# Patient Record
Sex: Female | Born: 1935 | Race: Black or African American | Hispanic: No | Smoking: Never smoker
Health system: Southern US, Community
[De-identification: ages and names within clinical notes are randomized; demographics above are authoritative.]

## PROBLEM LIST (undated history)

## (undated) DIAGNOSIS — G5600 Carpal tunnel syndrome, unspecified upper limb: Secondary | ICD-10-CM

## (undated) DIAGNOSIS — I10 Essential (primary) hypertension: Secondary | ICD-10-CM

## (undated) DIAGNOSIS — C50919 Malignant neoplasm of unspecified site of unspecified female breast: Secondary | ICD-10-CM

## (undated) HISTORY — DX: Carpal tunnel syndrome, unspecified upper limb: G56.00

## (undated) HISTORY — DX: Essential (primary) hypertension: I10

## (undated) HISTORY — PX: TONSILLECTOMY: SUR1361

## (undated) HISTORY — PX: OTHER SURGICAL HISTORY: SHX169

## (undated) HISTORY — DX: Malignant neoplasm of unspecified site of unspecified female breast: C50.919

---

## 2007-12-21 ENCOUNTER — Encounter: Payer: Self-pay | Admitting: Cardiology

## 2007-12-22 ENCOUNTER — Encounter: Payer: Self-pay | Admitting: Cardiology

## 2008-01-12 ENCOUNTER — Encounter: Payer: Self-pay | Admitting: Cardiology

## 2009-02-06 ENCOUNTER — Encounter: Payer: Self-pay | Admitting: Cardiology

## 2009-06-25 ENCOUNTER — Encounter: Payer: Self-pay | Admitting: Cardiology

## 2009-11-05 ENCOUNTER — Encounter: Payer: Self-pay | Admitting: Cardiology

## 2009-11-05 ENCOUNTER — Ambulatory Visit: Payer: Self-pay | Admitting: Cardiology

## 2009-11-06 ENCOUNTER — Encounter: Payer: Self-pay | Admitting: Cardiology

## 2009-11-20 ENCOUNTER — Encounter: Payer: Self-pay | Admitting: Cardiology

## 2010-02-19 ENCOUNTER — Encounter: Payer: Self-pay | Admitting: Cardiology

## 2010-03-19 ENCOUNTER — Telehealth: Payer: Self-pay | Admitting: Cardiology

## 2010-03-19 ENCOUNTER — Ambulatory Visit: Payer: Self-pay | Admitting: Cardiology

## 2010-03-19 DIAGNOSIS — Z8673 Personal history of transient ischemic attack (TIA), and cerebral infarction without residual deficits: Secondary | ICD-10-CM | POA: Insufficient documentation

## 2010-03-19 DIAGNOSIS — G459 Transient cerebral ischemic attack, unspecified: Secondary | ICD-10-CM

## 2010-03-19 DIAGNOSIS — I890 Lymphedema, not elsewhere classified: Secondary | ICD-10-CM

## 2010-03-19 DIAGNOSIS — I10 Essential (primary) hypertension: Secondary | ICD-10-CM | POA: Insufficient documentation

## 2010-03-19 DIAGNOSIS — R609 Edema, unspecified: Secondary | ICD-10-CM | POA: Insufficient documentation

## 2010-03-19 DIAGNOSIS — Z9010 Acquired absence of unspecified breast and nipple: Secondary | ICD-10-CM

## 2010-03-19 DIAGNOSIS — R0789 Other chest pain: Secondary | ICD-10-CM

## 2010-03-21 ENCOUNTER — Encounter: Payer: Self-pay | Admitting: Cardiology

## 2010-03-25 ENCOUNTER — Encounter: Payer: Self-pay | Admitting: Cardiology

## 2010-04-23 ENCOUNTER — Encounter: Payer: Self-pay | Admitting: Cardiology

## 2010-05-14 ENCOUNTER — Encounter: Payer: Self-pay | Admitting: Cardiology

## 2010-07-16 ENCOUNTER — Ambulatory Visit: Payer: Self-pay | Admitting: Cardiology

## 2010-07-16 DIAGNOSIS — G5600 Carpal tunnel syndrome, unspecified upper limb: Secondary | ICD-10-CM

## 2010-07-22 ENCOUNTER — Encounter: Payer: Self-pay | Admitting: Cardiology

## 2010-07-23 ENCOUNTER — Encounter: Payer: Self-pay | Admitting: Cardiology

## 2010-07-30 ENCOUNTER — Encounter: Payer: Self-pay | Admitting: Cardiology

## 2010-08-19 ENCOUNTER — Encounter: Payer: Self-pay | Admitting: Cardiology

## 2010-08-23 ENCOUNTER — Encounter (INDEPENDENT_AMBULATORY_CARE_PROVIDER_SITE_OTHER): Payer: Self-pay | Admitting: *Deleted

## 2010-09-10 NOTE — Consult Note (Signed)
Summary: PULMONARY FUNCTION TESTING/ DR. HENDERSON  PULMONARY FUNCTION TESTING/ DR. HENDERSON   Imported By: Zachary George 03/19/2010 10:31:30  _____________________________________________________________________  External Attachment:    Type:   Image     Comment:   External Document

## 2010-09-10 NOTE — Letter (Signed)
Summary: [facility] Occupational Therapy Recertification [facility] Occupational Therapy Recertification Lymphedema Therapy   Imported By: Roderic Ovens 05/10/2010 12:01:41  _____________________________________________________________________  External Attachment:    Type:   Image     Comment:   External Document

## 2010-09-10 NOTE — Letter (Signed)
Summary: [facility] D/C DR. Lia Hopping   [facility] D/C DR. XAJE HASANAJ   Imported By: Zachary George 03/19/2010 10:30:46  _____________________________________________________________________  External Attachment:    Type:   Image     Comment:   External Document

## 2010-09-10 NOTE — Assessment & Plan Note (Signed)
Summary: NP-CONSULT   Visit Type:  Initial Consult Primary Zuzanna Maroney:  Dr. Annette Stable A. Hasanaj  CC:  c/o SOB, hand, and &LEE.  History of Present Illness: The patient presents for evaluation of lower extremity edema.  She has also had increasing left extremity edema. The upper extremity edema has been present since a left mastectomy. However, it seems to be getting worse and now involves her hand. It has become uncomfortable. She has been on hydrochlorothiazide as part of her Diovan for some time and recently takes Lasix p.r.n. as well. While she says she urinates quite frequently she doesn't think this is healthy edema. She has had increasing bilateral lower extremity edema as well. This has been chronic but worsening. She does not keep her feet elevated as much as she could. She does limit salt but doesn't avoid it completely. Her cardiac workup has included an echocardiogram which demonstrated well-preserved ejection fraction and no evidence of pulmonary hypertension. There were no valvular abnormalities. She had a preserved ejection fraction with some evidence of diastolic dysfunction. She denies any severe shortness of breath and does not have PND or orthopnea. She does not have chest pressure, neck or arm discomfort. She denies any palpitations, presyncope or syncope.  Preventive Screening-Counseling & Management  Alcohol-Tobacco     Smoking Status: never  Current Medications (verified): 1)  Diovan 160 Mg Tabs (Valsartan) .... Take 1 Tablet By Mouth Once A Day 2)  Lortab 5-500 Mg Tabs (Hydrocodone-Acetaminophen) .... Take 1 Tablet By Mouth Two Times A Day 3)  Furosemide 20 Mg Tabs (Furosemide) .... Take 1 Tablet By Mouth Once A Day 4)  Multivitamins  Tabs (Multiple Vitamin) .... Take 1 Tablet By Mouth Once A Day 5)  Stool Softener 100 Mg Caps (Docusate Sodium) .... Take 2 Tablet By Mouth Once A Day 6)  Tizanidine Hcl 4 Mg Tabs (Tizanidine Hcl) .... Take 1 Tablet By Mouth Once A Day 7)  Advil  200 Mg Tabs (Ibuprofen) .... As Needed 8)  Artificial Tears  Soln (Artificial Tear Solution) .... As Needed 9)  L-Carnitine 500 Mg Tabs (Levocarnitine) .... Take 1 Tablet By Mouth Once A Day 10)  Caltrate 600+d 600-400 Mg-Unit Tabs (Calcium Carbonate-Vitamin D) .... Take 2 Tablet By Mouth Once A Day  Allergies (verified): 1)  ! Inderal 2)  ! Penicillin  Comments:  Nurse/Medical Assistant: The patient's medication bottles and allergies were reviewed with the patient and were updated in the Medication and Allergy Lists.  Past History:  Past Medical History: Hypertension Breast Cancer Carpal Tunnel  Family History: Father: Died age 40  Mother: Died age 43  Social History: Widowed  Alcohol Use - no Never smoked  Smoking Status:  never  Review of Systems       As stated in the HPI and negative for all other systems.   Vital Signs:  Patient profile:   77 year old female Height:      64 inches Weight:      198 pounds BMI:     34.11 Pulse rate:   76 / minute BP sitting:   155 / 84  (right arm) Cuff size:   large  Vitals Entered By: Carlye Grippe (March 19, 2010 2:00 PM)  Nutrition Counseling: Patient's BMI is greater than 25 and therefore counseled on weight management options. CC: c/o SOB,hand,&LEE   Physical Exam  General:  Well developed, well nourished, in no acute distress. Head:  normocephalic and atraumatic Eyes:  PERRLA/EOM intact; conjunctiva and lids  normal. Mouth:  Teeth, gums and palate normal. Oral mucosa normal. Neck:  Neck supple, no JVD. No masses, thyromegaly or abnormal cervical nodes. Chest Wall:  Status post left mastectomy Lungs:  Clear bilaterally to auscultation and percussion. Abdomen:  Bowel sounds positive; abdomen soft and non-tender without masses, organomegaly, or hernias noted. No hepatosplenomegaly, obese Msk:  Back normal, normal gait. Muscle strength and tone normal. Extremities:  severe left upper extremity swelling,  bilateral nonpitting lower extremity swelling Neurologic:  Alert and oriented x 3. Skin:  Intact without lesions or rashes. Cervical Nodes:  no significant adenopathy Psych:  Normal affect.   Detailed Cardiovascular Exam  Neck    Carotids: Carotids full and equal bilaterally without bruits.      Neck Veins: Normal, no JVD.    Heart    Inspection: no deformities or lifts noted.      Palpation: normal PMI with no thrills palpable.      Auscultation: regular rate and rhythm, S1, S2 without murmurs, rubs, gallops, or clicks.    Vascular    Abdominal Aorta: no palpable masses, pulsations, or audible bruits.      Pedal Pulses: dorsalis pedis bilaterally normal    Radial Pulses: normal radial pulses bilaterally.      Peripheral Circulation: no clubbing, cyanosis, or edema noted with normal capillary refill.     Impression & Recommendations:  Problem # 1:  LYMPHEDEMA, LEFT ARM (ICD-457.1) I believe that her left edema is lymphedema related to the mastectomy. We discussed conservative therapies of keeping her arm elevated. I will refer her for occupational therapy consultation for massage therapy of lymphedema. This is done at the hospital. Orders: Misc. Referral (Misc. Ref)  Problem # 2:  HYPERTENSION (ICD-401.9) I reviewed her blood pressure diary from home and it is usually well controlled. I will suggest no change today.  Problem # 3:  DEPENDENT EDEMA, LEGS, BILATERAL (ICD-782.3) She may have a mixed component for her lower extremity edema. There may be some element of lymphedema as it is firm and not responsive to diuresis. There is some pitting component. We discussed salt and fluid restriction and keeping her feet elevated. She cannot pull on compression stockings. She doesn't want more diuretic as she is urinating frequently. I will check venous Dopplers to look for incompetence. If there is no evidence of this perhaps she would benefit from physical therapy or massage therapy for  lymphedema of her lower extremities as well. Orders: Venous Duplex Lower Extremity (Venous Dup Lower E)  Patient Instructions: 1)  Your physician has requested that you have a lower or upper extremity venous duplex.  This test is an ultrasound of the veins in the legs or arms.  It looks at venous blood flow that carries blood from the heart to the legs or arms.  Allow one hour for a Lower Venous exam.  Allow thirty minutes for an Upper Venous exam. There are no restrictions or special instructions. 2)  You have been referred to [facility] Outpatient Occupational Therapy. 3)  Your physician wants you to follow-up in: 4 months. You will receive a reminder letter in the mail one-two months in advance. If you don't receive a letter, please call our office to schedule the follow-up appointment.

## 2010-09-10 NOTE — Letter (Signed)
Summary: [facility] - Physical Rehabilitation Services  [facility] - Physical Rehabilitation Services   Imported By: Marylou Mccoy 05/20/2010 10:20:44  _____________________________________________________________________  External Attachment:    Type:   Image     Comment:   External Document

## 2010-09-10 NOTE — Letter (Signed)
Summary: External Correspondence/ OFFICE VISIT DR. HASANAJ  External Correspondence/ OFFICE VISIT DR. HASANAJ   Imported By: Dorise Hiss 03/08/2010 15:55:15  _____________________________________________________________________  External Attachment:    Type:   Image     Comment:   External Document

## 2010-09-10 NOTE — Letter (Signed)
Summary: [facility] Physical Rehab Evaluation   [facility] Physical Rehab Evaluation   Imported By: Roderic Ovens 04/03/2010 11:31:54  _____________________________________________________________________  External Attachment:    Type:   Image     Comment:   External Document

## 2010-09-10 NOTE — Letter (Signed)
Summary: External Correspondence/ OFFICE VISIT DR. HASANAJ  External Correspondence/ OFFICE VISIT DR. HASANAJ   Imported By: Dorise Hiss 03/08/2010 15:53:38  _____________________________________________________________________  External Attachment:    Type:   Image     Comment:   External Document

## 2010-09-10 NOTE — Letter (Signed)
Summary: [facility] D/C DR. Lia Hopping  [facility] D/C DR. XAJE HASANAJ   Imported By: Zachary George 03/19/2010 10:33:53  _____________________________________________________________________  External Attachment:    Type:   Image     Comment:   External Document

## 2010-09-10 NOTE — Assessment & Plan Note (Signed)
Summary: 4 MO FU PER DEC REMINDER   Visit Type:  Follow-up Primary Provider:  Dr. Annette Stable A. Hasanaj  CC:  HTN and Edema.  History of Present Illness: The patient presents for followup of swelling and hypertension. At the last visit I sent her to physical therapy for lymphedema treatment of her left arm. It seems to have helped her arm is much smaller. She's not wearing the sleeve or glove today. She does think she feels a little bit better following this therapy. She has other complaints such as bilateral sciatica and tingling and numbness in her right hand. She has continued chronic lower extremity nonpitting edema. She brings a blood pressure diary today and demonstrates its typically in the 150s to 160s systolic with diastolics in the 90s. She is not describing new shortness of breath, PND or orthopnea. She has not discussed his new palpitations, presyncope or syncope.  Preventive Screening-Counseling & Management  Alcohol-Tobacco     Smoking Status: never  Current Medications (verified): 1)  Diovan Hct 160-25 Mg Tabs (Valsartan-Hydrochlorothiazide) .... Take 1 Tablet By Mouth Once A Day 2)  Multivitamins  Tabs (Multiple Vitamin) .... Take 1 Tablet By Mouth Once A Day 3)  Senokot S 8.6-50 Mg Tabs (Sennosides-Docusate Sodium) .... Take 2 Tablet By Mouth Once A Day 4)  Tizanidine Hcl 4 Mg Tabs (Tizanidine Hcl) .... Take 1 Tablet By Mouth Once A Day 5)  Advil 200 Mg Tabs (Ibuprofen) .... As Needed 6)  Artificial Tears  Soln (Artificial Tear Solution) .... As Needed 7)  Caltrate 600+d 600-400 Mg-Unit Tabs (Calcium Carbonate-Vitamin D) .... Take 2 Tablet By Mouth Once A Day 8)  Meloxicam 15 Mg Tabs (Meloxicam) .... Take 1 Tablet By Mouth Once A Day 9)  Gabapentin 300 Mg Caps (Gabapentin) .... Take 1 Tablet By Mouth Three Times A Day 10)  Alprazolam 0.25 Mg Tabs (Alprazolam) .... Take 1 Tablet By Mouth Two Times A Day 11)  Tramadol Hcl 50 Mg Tabs (Tramadol Hcl) .... Take 1 Tablet By Mouth Four  Times A Day  Allergies (verified): 1)  ! Inderal 2)  ! Penicillin  Comments:  Nurse/Medical Assistant: The patient's medication bottles and allergies were reviewed with the patient and were updated in the Medication and Allergy Lists.  Past History:  Past Medical History: Reviewed history from 03/19/2010 and no changes required. Hypertension Breast Cancer Carpal Tunnel  Past Surgical History: Reviewed history from 03/19/2010 and no changes required. Tonsillectomy Left Breast Mastectomy  Review of Systems       As stated in the HPI and negative for all other systems.   Vital Signs:  Patient profile:   77 year old female Height:      64 inches Weight:      185 pounds Pulse rate:   102 / minute BP sitting:   128 / 82  (right arm) Cuff size:   large  Vitals Entered By: Carlye Grippe (July 16, 2010 3:42 PM)  Serial Vital Signs/Assessments:  Time      Position  BP       Pulse  Resp  Temp     By 3:52 PM             144/89   96                    Carlye Grippe  Comments: 3:52 PM right arm/reg cuff By: Carlye Grippe    Physical Exam  General:  Well developed, well nourished, in  no acute distress. Head:  normocephalic and atraumatic Eyes:  PERRLA/EOM intact; conjunctiva and lids normal. Neck:  Neck supple, no JVD. No masses, thyromegaly or abnormal cervical nodes. Chest Wall:  Status post left mastectomy Lungs:  Clear bilaterally to auscultation and percussion. Abdomen:  Bowel sounds positive; abdomen soft and non-tender without masses, organomegaly, or hernias noted. No hepatosplenomegaly, obese Msk:  Back normal, normal gait. Muscle strength and tone normal. Extremities:  less severe left upper extremity swelling, bilateral nonpitting lower extremity swelling Neurologic:  Alert and oriented x 3. Skin:  Intact without lesions or rashes. Cervical Nodes:  no significant adenopathy Axillary Nodes:  no significant adenopathy Psych:  Normal  affect.   Detailed Cardiovascular Exam  Neck    Carotids: Carotids full and equal bilaterally without bruits.      Neck Veins: Normal, no JVD.    Heart    Inspection: no deformities or lifts noted.      Palpation: normal PMI with no thrills palpable.      Auscultation: regular rate and rhythm, S1, S2 without murmurs, rubs, gallops, or clicks.    Vascular    Abdominal Aorta: no palpable masses, pulsations, or audible bruits.      Pedal Pulses: dorsalis pedis bilaterally normal    Radial Pulses: normal radial pulses bilaterally.      Peripheral Circulation: no clubbing, cyanosis, or edema noted with normal capillary refill.     Impression & Recommendations:  Problem # 1:  LYMPHEDEMA, LEFT ARM (ICD-457.1) This is improved. I discussed with her the importance of keeping the arm elevated and wearing sleeves. She understands this will not improve with diuretics and she needs these mechanical treatments.  Problem # 2:  HYPERTENSION (ICD-401.9) Her blood pressure is not controlled. I will increase her Diovan HCT 320/25. She will get a basic metabolic profile in 2 weeks.  Problem # 3:  CARPAL TUNNEL SYNDROME, RIGHT (ICD-354.0) The patient asked if her right hand tingling could be vascular but there is no evidence of this. I will defer to her primary providers.  Other Orders: T-Basic Metabolic Panel [phone])  Patient Instructions: 1)  Your physician wants you to follow-up in: 6 months. You will receive a reminder letter in the mail one-two months in advance. If you don't receive a letter, please call our office to schedule the follow-up appointment. 2)  Increase Diovan HCT to 320/25mg  by mouth once daily. You will need to get new prescription filled for this.  3)  Your physician recommends that you go to the [facility] for lab work: DO LAB WORK IN 2 WEEKS. Prescriptions: DIOVAN HCT 320-25 MG TABS (VALSARTAN-HYDROCHLOROTHIAZIDE) Take 1 tablet by mouth once a day  #30 x 6    Entered by:   Cyril Loosen, RN, BSN   Authorized by:   Rollene Rotunda, MD, [facility]   Signed by:   Cyril Loosen, RN, BSN on 07/16/2010   Method used:   Electronically to        [facility]  [address] [phone]* (retail)       [address]       [place], [place]  [address]       Ph: [phone]       Fax: [phone]   RxID:   [id number]  I have reviewed and approved all prescriptions at the time of this visit. Rollene Rotunda, MD, [facility]  July 16, 2010 4:16 PM  Appended Document: Orders Update    Clinical Lists Changes  Orders: Added new Referral  order of Misc. Referral (Misc. Ref) - Signed

## 2010-09-10 NOTE — Letter (Signed)
Summary: External Correspondence/ OFFICE VISIT DR. HASANAJ  External Correspondence/ OFFICE VISIT DR. HASANAJ   Imported By: Dorise Hiss 03/08/2010 15:56:33  _____________________________________________________________________  External Attachment:    Type:   Image     Comment:   External Document

## 2010-09-10 NOTE — Progress Notes (Signed)
Summary: PHONE:  TAKING ADVIL  Phone Note Call from Patient Call back at Home Phone [phone]   Caller: Patient Reason for Call: Talk to Doctor Summary of Call: Wants to know if she can continue taking Advil 200 nmg. States that she forgot to ask Dr. Antoine Poche on her visit today.  Initial call taken by: Zachary George,  March 19, 2010 3:14 PM  Follow-up for Phone Call        [facility] to take Advil Follow-up by: Rollene Rotunda, MD, [facility],  March 21, 2010 1:57 PM  Additional Follow-up for Phone Call Additional follow up Details #1::        Pt notified and verbalized understanding.  Additional Follow-up by: Cyril Loosen, RN, BSN,  March 21, 2010 3:30 PM

## 2010-09-12 NOTE — Letter (Signed)
Summary: External Correspondence/ [facility]  External Correspondence/ [facility]   Imported By: Dorise Hiss 07/24/2010 10:48:44  _____________________________________________________________________  External Attachment:    Type:   Image     Comment:   External Document

## 2010-09-12 NOTE — Letter (Signed)
Summary: Engineer, materials at [facility]  [address]   [place], [place] [address]   Phone: [phone]  Fax: [phone]        August 23, 2010 MRN: [id number]    [facility] [address] [place], [place]  [address]    Dear Ms. Bagnall,  Your test ordered by Selena Batten has been reviewed by your physician (or physician assistant) and was found to be normal or stable. Your physician (or physician assistant) felt no changes were needed at this time.  ____ Echocardiogram  ____ Cardiac Stress Test  __X__ Lab Work  ____ Peripheral vascular study of arms, legs or neck  ____ CT scan or X-ray  ____ Lung or Breathing test  ____ Other:   Thank you.   Cyril Loosen, RN, BSN    Duane Boston, M.D., F.A.C.C. Thressa Sheller, M.D., F.A.C.C. Oneal Grout, M.D., F.A.C.C. Cheree Ditto, M.D., F.A.C.C. Daiva Nakayama, M.D., F.A.C.C. Kenney Houseman, M.D., F.A.C.C. Jeanne Ivan, PA-C

## 2010-09-12 NOTE — Consult Note (Signed)
Summary: [facility] Evaluation Request   [facility] Evaluation Request   Imported By: Roderic Ovens 08/08/2010 12:12:36  _____________________________________________________________________  External Attachment:    Type:   Image     Comment:   External Document

## 2011-01-20 ENCOUNTER — Encounter: Payer: Self-pay | Admitting: Cardiology

## 2011-01-28 ENCOUNTER — Ambulatory Visit: Payer: Self-pay | Admitting: Cardiology

## 2019-03-11 ENCOUNTER — Ambulatory Visit (INDEPENDENT_AMBULATORY_CARE_PROVIDER_SITE_OTHER)
Admission: EM | Admit: 2019-03-11 | Discharge: 2019-03-11 | Disposition: A | Discharge status: Other Acute Inpatient Hospital | Payer: Medicare Other | Source: Home / Self Care

## 2019-03-11 ENCOUNTER — Emergency Department (HOSPITAL_COMMUNITY): Payer: Medicare Other

## 2019-03-11 ENCOUNTER — Other Ambulatory Visit: Payer: Self-pay

## 2019-03-11 ENCOUNTER — Encounter (HOSPITAL_COMMUNITY): Payer: Self-pay | Admitting: Emergency Medicine

## 2019-03-11 ENCOUNTER — Emergency Department (HOSPITAL_COMMUNITY)
Admission: EM | Admit: 2019-03-11 | Discharge: 2019-03-12 | Disposition: A | Discharge status: Home / Self Care | Payer: Medicare Other | Attending: Emergency Medicine | Admitting: Emergency Medicine

## 2019-03-11 DIAGNOSIS — R41841 Cognitive communication deficit: Secondary | ICD-10-CM | POA: Insufficient documentation

## 2019-03-11 DIAGNOSIS — R410 Disorientation, unspecified: Secondary | ICD-10-CM | POA: Diagnosis not present

## 2019-03-11 DIAGNOSIS — I10 Essential (primary) hypertension: Secondary | ICD-10-CM

## 2019-03-11 DIAGNOSIS — Z853 Personal history of malignant neoplasm of breast: Secondary | ICD-10-CM | POA: Insufficient documentation

## 2019-03-11 DIAGNOSIS — R404 Transient alteration of awareness: Secondary | ICD-10-CM

## 2019-03-11 DIAGNOSIS — R531 Weakness: Secondary | ICD-10-CM | POA: Diagnosis present

## 2019-03-11 DIAGNOSIS — R030 Elevated blood-pressure reading, without diagnosis of hypertension: Secondary | ICD-10-CM

## 2019-03-11 DIAGNOSIS — D329 Benign neoplasm of meninges, unspecified: Secondary | ICD-10-CM | POA: Diagnosis not present

## 2019-03-11 DIAGNOSIS — R4789 Other speech disturbances: Secondary | ICD-10-CM

## 2019-03-11 DIAGNOSIS — Z8673 Personal history of transient ischemic attack (TIA), and cerebral infarction without residual deficits: Secondary | ICD-10-CM

## 2019-03-11 LAB — POCT URINALYSIS DIP (DEVICE)
Bilirubin Urine: NEGATIVE
Glucose, UA: NEGATIVE mg/dL
Hgb urine dipstick: NEGATIVE
Ketones, ur: NEGATIVE mg/dL
Leukocytes,Ua: NEGATIVE
Nitrite: NEGATIVE
Protein, ur: NEGATIVE mg/dL
Specific Gravity, Urine: 1.02 (ref 1.005–1.030)
Urobilinogen, UA: 0.2 mg/dL (ref 0.0–1.0)
pH: 6.5 (ref 5.0–8.0)

## 2019-03-11 LAB — CBG MONITORING, ED: Glucose-Capillary: 87 mg/dL (ref 70–99)

## 2019-03-11 LAB — COMPREHENSIVE METABOLIC PANEL
ALT: 10 U/L (ref 0–44)
AST: 20 U/L (ref 15–41)
Albumin: 3.8 g/dL (ref 3.5–5.0)
Alkaline Phosphatase: 48 U/L (ref 38–126)
Anion gap: 10 (ref 5–15)
BUN: 10 mg/dL (ref 8–23)
CO2: 26 mmol/L (ref 22–32)
Calcium: 9.6 mg/dL (ref 8.9–10.3)
Chloride: 99 mmol/L (ref 98–111)
Creatinine, Ser: 0.65 mg/dL (ref 0.44–1.00)
GFR calc Af Amer: >60 mL/min (ref >60)
GFR calc non Af Amer: >60 mL/min (ref >60)
Glucose, Bld: 100 mg/dL — ABNORMAL HIGH (ref 70–99)
Potassium: 3.8 mmol/L (ref 3.5–5.1)
Sodium: 135 mmol/L (ref 135–145)
Total Bilirubin: 1.1 mg/dL (ref 0.3–1.2)
Total Protein: 7.3 g/dL (ref 6.5–8.1)

## 2019-03-11 LAB — CBC
HCT: 40.6 % (ref 36.0–46.0)
Hemoglobin: 13.2 g/dL (ref 12.0–15.0)
MCH: 27.8 pg (ref 26.0–34.0)
MCHC: 32.5 g/dL (ref 30.0–36.0)
MCV: 85.7 fL (ref 80.0–100.0)
Platelets: 128 10*3/uL — ABNORMAL LOW (ref 150–400)
RBC: 4.74 MIL/uL (ref 3.87–5.11)
RDW: 14 % (ref 11.5–15.5)
WBC: 2.4 10*3/uL — ABNORMAL LOW (ref 4.0–10.5)
nRBC: 0 % (ref 0.0–0.2)

## 2019-03-11 LAB — DIFFERENTIAL
Abs Immature Granulocytes: 0 10*3/uL (ref 0.00–0.07)
Basophils Absolute: 0 10*3/uL (ref 0.0–0.1)
Basophils Relative: 0 %
Eosinophils Absolute: 0.1 10*3/uL (ref 0.0–0.5)
Eosinophils Relative: 3 %
Immature Granulocytes: 0 %
Lymphocytes Relative: 39 %
Lymphs Abs: 1 10*3/uL (ref 0.7–4.0)
Monocytes Absolute: 0.3 10*3/uL (ref 0.1–1.0)
Monocytes Relative: 14 %
Neutro Abs: 1 10*3/uL — ABNORMAL LOW (ref 1.7–7.7)
Neutrophils Relative %: 44 %

## 2019-03-11 LAB — PROTIME-INR
INR: 1.1 (ref 0.8–1.2)
Prothrombin Time: 14.4 seconds (ref 11.4–15.2)

## 2019-03-11 LAB — APTT: aPTT: 33 seconds (ref 24–36)

## 2019-03-11 MED ORDER — SODIUM CHLORIDE 0.9% FLUSH
3.0000 mL | Freq: Once | INTRAVENOUS | Status: DC
Start: 2019-03-11 — End: 2019-03-12

## 2019-03-11 NOTE — ED Notes (Signed)
Patient assisted to restroom, stand by with two assist, able to bear weight on legs but c/o "I don't know why my legs have just stopped working the last few days".

## 2019-03-11 NOTE — ED Provider Notes (Addendum)
MRN: [id number] DOB: 07/20/1925  Subjective:   Cassandra Wu is a 85 y.o. female presenting for 1 day history of acute onset of AMS.  Patient and her daughter report that she became confused and was having difficulty finding words and gathering her thoughts.  She did check her blood pressure last night and was elevated in the 160s where she has been normal and off blood pressure medications for the past year to 2 years.  Her PCP was on board with this as her blood pressure readings were normal.  However they have remained elevated until today.  She also complains of chronic bilateral lower leg edema that is actually improved today.  She is also had urinary frequency at night and is not keen on taking a diuretic as a result.  Denies fever, active confusion, vision changes, facial droop, headache, chest pain, nausea, vomiting, abdominal pain, dysuria, hematuria.  Reports that she is not taking any medications currently.  No current facility-administered medications for this encounter.   Current Outpatient Medications:  .  ALPRAZolam (XANAX) 0.25 MG tablet, Take 0.25 mg by mouth 2 (two) times daily.  , Disp: , Rfl:  .  ARTIFICIAL TEARS ophthalmic solution, 1 drop as needed.  , Disp: , Rfl:  .  Calcium Carbonate-Vitamin D (CALTRATE 600+D) 600-400 MG-UNIT per tablet, Take 1 tablet by mouth daily.  , Disp: , Rfl:  .  gabapentin (NEURONTIN) 300 MG capsule, Take 300 mg by mouth 3 (three) times daily.  , Disp: , Rfl:  .  ibuprofen (ADVIL) 200 MG tablet, Take 200 mg by mouth as needed.  , Disp: , Rfl:  .  meloxicam (MOBIC) 15 MG tablet, Take 15 mg by mouth daily.  , Disp: , Rfl:  .  Multiple Vitamins-Minerals (MULTIVITAL) tablet, Take 1 tablet by mouth daily.  , Disp: , Rfl:  .  senna-docusate (SENOKOT S) 8.6-50 MG per tablet, Take 2 tablets by mouth daily.  , Disp: , Rfl:  .  tiZANidine (ZANAFLEX) 4 MG tablet, Take 4 mg by mouth daily.  , Disp: , Rfl:  .  traMADol (ULTRAM) 50 MG tablet, Take 50  mg by mouth 4 (four) times daily.  , Disp: , Rfl:  .  valsartan-hydrochlorothiazide (DIOVAN HCT) 320-25 MG per tablet, Take 1 tablet by mouth daily.  , Disp: , Rfl:     Allergies  Allergen Reactions  . Penicillins   . Propranolol Hcl     Past Medical History:  Diagnosis Date  . Breast cancer ([place])   . Carpal tunnel syndrome   . Hypertension      Past Surgical History:  Procedure Laterality Date  . left breast mastectomy (other)    . TONSILLECTOMY      History reviewed. No pertinent family history.   Review of Systems  Constitutional: Negative for fever and malaise/fatigue.  HENT: Negative for congestion, ear pain, sinus pain and sore throat.   Eyes: Negative for blurred vision, double vision, discharge and redness.  Respiratory: Negative for cough, hemoptysis, shortness of breath and wheezing.   Cardiovascular: Negative for chest pain.       Reports intermittent "strain" of her heart and sees a cardiologist.  Gastrointestinal: Negative for abdominal pain, diarrhea, nausea and vomiting.  Genitourinary: Negative for dysuria, flank pain and hematuria.  Musculoskeletal: Positive for joint pain. Negative for myalgias.       Leg swelling.  Skin: Negative for rash.  Neurological: Positive for speech change and weakness. Negative for dizziness and  headaches.  Psychiatric/Behavioral: Positive for memory loss. Negative for depression and substance abuse.    Objective:   Vitals: BP (!) 160/108 (BP Location: Left Arm)   Pulse 89   Temp 98.6 F (37 C) (Oral)   Resp 18   SpO2 96%   BP Readings from Last 3 Encounters:  03/11/19 (!) 160/108   BP recheck at 19:14 was 161/112.   Physical Exam Constitutional:      General: She is not in acute distress.    Appearance: Normal appearance. She is well-developed. She is not ill-appearing, toxic-appearing or diaphoretic.  HENT:     Head: Normocephalic and atraumatic.     Nose: Nose normal.     Mouth/Throat:     Mouth: Mucous  membranes are moist.  Eyes:     Extraocular Movements: Extraocular movements intact.     Pupils: Pupils are equal, round, and reactive to light.  Cardiovascular:     Rate and Rhythm: Normal rate and regular rhythm.     Pulses: Normal pulses.     Heart sounds: Murmur (Soft systolic ejection murmur best heard at RUSB) present. No friction rub. No gallop.   Pulmonary:     Effort: Pulmonary effort is normal. No respiratory distress.     Breath sounds: Normal breath sounds. No stridor. No wheezing, rhonchi or rales.  Musculoskeletal:     Right lower leg: Edema present.     Left lower leg: Edema present.  Skin:    General: Skin is warm and dry.     Findings: No rash.  Neurological:     General: No focal deficit present.     Mental Status: She is alert and oriented to person, place, and time.     Cranial Nerves: No cranial nerve deficit.     Motor: Weakness present.     Coordination: Coordination abnormal (wheelchair bound).     Gait: Gait abnormal (wheelchair bound).  Psychiatric:        Mood and Affect: Mood normal.        Behavior: Behavior normal.        Thought Content: Thought content normal.        Judgment: Judgment normal.    Results for orders placed or performed during the hospital encounter of 03/11/19 (from the past 24 hour(s))  POCT urinalysis dip (device)     Status: None   Collection Time: 03/11/19  6:35 PM  Result Value Ref Range   Glucose, UA NEGATIVE NEGATIVE mg/dL   Bilirubin Urine NEGATIVE NEGATIVE   Ketones, ur NEGATIVE NEGATIVE mg/dL   Specific Gravity, Urine 1.020 1.005 - 1.030   Hgb urine dipstick NEGATIVE NEGATIVE   pH 6.5 5.0 - 8.0   Protein, ur NEGATIVE NEGATIVE mg/dL   Urobilinogen, UA 0.2 0.0 - 1.0 mg/dL   Nitrite NEGATIVE NEGATIVE   Leukocytes,Ua NEGATIVE NEGATIVE    Assessment and Plan :   1. Confusion   2. Transient alteration of awareness   3. Elevated blood pressure reading   4. History of transient ischemic attack (TIA)     Given  patient's history of TIA, new onset recurrent elevated blood pressure and AMS yesterday, counseled that she may have had a TIA and needs evaluation in the ER/head CT to rule out impending intracranial process.  Urine dipstick analysis and physical exam reassuring, patient and her daughter are in agreement with plan to rule out intracranial process with head CT via the ER.  They contract for safety and will Nira Conn now.  Jaynee Eagles, [place] 03/11/19 1927

## 2019-03-11 NOTE — ED Notes (Signed)
Went to get vitals patient gone to get scan

## 2019-03-11 NOTE — ED Triage Notes (Signed)
Pt presents to [facility] for assessment of headache which occurred for 3 days, and yesterday while patient was on the phone with her son, she could not get the words to come out of her mouth that she wanted to speak.  States she was having trouble gathering her thoughts.  The son called his sister, who brings the patient in today.  States she was going to take her to the ER, but noted that when she began to interact with the patient more and get her ready to come, her energy level, cognition, and flexibility began to improve.  Pt c/o urinary frequency "for a long time".  States she was recent trialed on a medication to slow down her frequency, which patient did not like and decided not to take.

## 2019-03-11 NOTE — ED Notes (Signed)
We

## 2019-03-11 NOTE — ED Triage Notes (Signed)
Per pt she has been having  Some weakness and tingling in her right hand for about 1 week and then some tingling in her left arm Pt said she was disoriented yesterday when her son had called her. She could not get out what she needed to say. p. Pt is alert oriented x 4. Pt said she has been having a headache for about 2 weeks off and on. Pt said trouble reading but her normal

## 2019-03-11 NOTE — ED Notes (Signed)
Needs blood 

## 2019-03-11 NOTE — ED Provider Notes (Signed)
[place] EMERGENCY DEPARTMENT Provider Note   CSN: [id number] Arrival date & time: 03/11/19  1940    History   Chief Complaint Chief Complaint  Patient presents with   Weakness   Numbness    HPI Cassandra Wu is a 85 y.o. female with a hx of breast cancer (s/p L mastectomy), carpel tunnel, HTN ( presents to the Emergency Department complaining of episode of weakness and word finding difficulty approximately 5 PM yesterday.  This lasted approximately 30 minutes to an hour and resolved.  Patient's daughter who is at bedside with the patient reports that when she arrived at her mother's side approximately 2 hours later her mother was no longer confused or having word finding difficulties but was generally weak.  Patient was observed overnight at home and this morning presented to the urgent care for a checkup.  At that visit her urinalysis was normal but due to concern for possible TIA she was referred here to the emergency department.  Daughter reports that mother has been generally weak but has had no focal neuro deficits, no abnormal gait, no additional episodes of word finding difficulty, no slurred speech and no altered mental status.  Patient lives alone and performs the majority of her ADLs without assistance.  Someone helps tidy around the house 3 days/week.  Patient is ambulatory without difficulty using a walker.  Patient reports some numbness and tingling in her hands which has been present for the last 15 years.  This is not new or unchanged in the last days to week's.  No numbness or tingling anywhere else.  Patient does carry a history of carpal tunnel.  Patient and daughter deny fever, chills, neck pain, chest pain, shortness of breath, abdominal pain, nausea, vomiting, diarrhea, syncope, dysuria.  Patient does endorse urinary frequency for the last week or so, but no other urinary symptoms.      The history is provided by the patient, medical records  and a relative. No language interpreter was used.    Past Medical History:  Diagnosis Date   Breast cancer [facility])    Carpal tunnel syndrome    Hypertension     Patient Active Problem List   Diagnosis Date Noted   CARPAL TUNNEL SYNDROME, RIGHT 07/16/2010   HYPERTENSION 03/19/2010   TIA 03/19/2010   LYMPHEDEMA, LEFT ARM 03/19/2010   DEPENDENT EDEMA, LEGS, BILATERAL 03/19/2010   OTHER CHEST PAIN 03/19/2010   MASTECTOMY, BILATERAL, HX OF 03/19/2010    Past Surgical History:  Procedure Laterality Date   left breast mastectomy (other)     TONSILLECTOMY       OB History   No obstetric history on file.      Home Medications    Prior to Admission medications   Medication Sig Start Date End Date Taking? Authorizing Provider  ALPRAZolam (XANAX) 0.25 MG tablet Take 0.25 mg by mouth 2 (two) times daily.      [provider]  ARTIFICIAL TEARS ophthalmic solution 1 drop as needed.      [provider]  Calcium Carbonate-Vitamin D (CALTRATE 600+D) 600-400 MG-UNIT per tablet Take 1 tablet by mouth daily.      [provider]  ibuprofen (ADVIL) 200 MG tablet Take 200 mg by mouth as needed.      [provider]  Multiple Vitamins-Minerals (MULTIVITAL) tablet Take 1 tablet by mouth daily.      [provider]  senna-docusate (SENOKOT S) 8.6-50 MG per tablet Take 2 tablets  by mouth daily.      [provider]  valsartan-hydrochlorothiazide (DIOVAN HCT) 320-25 MG tablet Take 1 tablet by mouth daily. 03/12/19   Mossie Gilder, Jarrett Soho, PA-C  gabapentin (NEURONTIN) 300 MG capsule Take 300 mg by mouth 3 (three) times daily.    03/12/19  [provider]    Family History No family history on file.  Social History Social History   Tobacco Use   Smoking status: Never Smoker   Smokeless tobacco: Never Used  Substance Use Topics   Alcohol use: No   Drug use: Not on file     Allergies   Penicillins and Propranolol  hcl   Review of Systems Review of Systems  Constitutional: Negative for appetite change, diaphoresis, fatigue, fever and unexpected weight change.  HENT: Negative for mouth sores.   Eyes: Negative for visual disturbance.  Respiratory: Negative for cough, chest tightness, shortness of breath and wheezing.   Cardiovascular: Negative for chest pain.  Gastrointestinal: Negative for abdominal pain, constipation, diarrhea, nausea and vomiting.  Endocrine: Negative for polydipsia, polyphagia and polyuria.  Genitourinary: Negative for dysuria, frequency, hematuria and urgency.  Musculoskeletal: Negative for back pain and neck stiffness.  Skin: Negative for rash.  Allergic/Immunologic: Negative for immunocompromised state.  Neurological: Positive for weakness (Generalized). Negative for syncope, light-headedness and headaches.       Word finding difficulties -resolved  Hematological: Does not bruise/bleed easily.  Psychiatric/Behavioral: Negative for sleep disturbance. The patient is not nervous/anxious.      Physical Exam Updated Vital Signs BP (!) 175/106 (BP Location: Right Arm)    Pulse 87    Temp 98.3 F (36.8 C) (Oral)    Resp 16    SpO2 99%   Physical Exam Vitals signs and nursing note reviewed.  Constitutional:      General: She is not in acute distress.    Appearance: She is well-developed. She is not diaphoretic.  HENT:     Head: Normocephalic and atraumatic.  Eyes:     General: No scleral icterus.    Conjunctiva/sclera: Conjunctivae normal.     Pupils: Pupils are equal, round, and reactive to light.     Comments: No horizontal, vertical or rotational nystagmus  Neck:     Musculoskeletal: Normal range of motion and neck supple.     Comments: Full active and passive ROM without pain No midline or paraspinal tenderness No nuchal rigidity or meningeal signs Cardiovascular:     Rate and Rhythm: Normal rate and regular rhythm.  Pulmonary:     Effort: Pulmonary effort is  normal. No respiratory distress.     Breath sounds: Normal breath sounds. No wheezing or rales.  Abdominal:     General: Bowel sounds are normal.     Palpations: Abdomen is soft.     Tenderness: There is no abdominal tenderness. There is no guarding or rebound.  Musculoskeletal: Normal range of motion.  Lymphadenopathy:     Cervical: No cervical adenopathy.  Skin:    General: Skin is warm and dry.     Findings: No rash.  Neurological:     Mental Status: She is alert and oriented to person, place, and time.     Cranial Nerves: No cranial nerve deficit.     Motor: No abnormal muscle tone.     Coordination: Coordination normal.     Comments: Mental Status:  Alert, oriented, thought content appropriate. Speech fluent without evidence of aphasia. Able to follow 2 step commands without difficulty.  Cranial Nerves:  II:  Peripheral visual fields grossly normal, pupils equal, round, reactive to light III,IV, VI: ptosis not present, extra-ocular motions intact bilaterally  V,VII: smile symmetric, facial light touch sensation equal VIII: hearing grossly normal bilaterally  IX,X: midline uvula rise  XI: bilateral shoulder shrug equal and strong XII: midline tongue extension  Motor:  5/5 in upper and lower extremities bilaterally including strong and equal grip strength and dorsiflexion/plantar flexion Sensory: Pinprick and light touch normal in all extremities.  Cerebellar: normal finger-to-nose with bilateral upper extremities CV: distal pulses palpable throughout   Psychiatric:        Behavior: Behavior normal.        Thought Content: Thought content normal.        Judgment: Judgment normal.      ED Treatments / Results  Labs (all labs ordered are listed, but only abnormal results are displayed) Labs Reviewed  CBC - Abnormal; Notable for the following components:      Result Value   WBC 2.4 (*)    Platelets 128 (*)    All other components within normal limits  DIFFERENTIAL -  Abnormal; Notable for the following components:   Neutro Abs 1.0 (*)    All other components within normal limits  COMPREHENSIVE METABOLIC PANEL - Abnormal; Notable for the following components:   Glucose, Bld 100 (*)    All other components within normal limits  PROTIME-INR  APTT  CBG MONITORING, ED    EKG EKG Interpretation  Date/Time:  Friday March 11 2019 20:24:13 EDT Ventricular Rate:  88 PR Interval:  180 QRS Duration: 82 QT Interval:  368 QTC Calculation: 445 R Axis:   32 Text Interpretation:  Normal sinus rhythm Normal ECG Confirmed by Ripley Fraise [phone]) on 03/12/2019 12:16:51 AM   Radiology Ct Head Wo Contrast  Result Date: 03/11/2019 CLINICAL DATA:  Right upper extremity weakness and tingling sensation EXAM: CT HEAD WITHOUT CONTRAST TECHNIQUE: Contiguous axial images were obtained from the base of the skull through the vertex without intravenous contrast. COMPARISON:  November 05, 2009 FINDINGS: Brain: There is age related volume loss. There is no intracranial mass, hemorrhage, extra-axial fluid collection, or midline shift. There is patchy small vessel disease in the centra semiovale bilaterally. Elsewhere brain parenchyma appears unremarkable. No evident acute infarct. Vascular: There is no hyperdense vessel. There is calcification in each carotid siphon and distal vertebral artery region. Skull: The bony calvarium appears intact. Sinuses/Orbits: There is mucosal thickening in several ethmoid air cells. There is a retention cyst in the inferior right maxillary antrum. Visualized paranasal sinuses elsewhere clear. Orbits appear symmetric bilaterally. Other: Mastoid air cells are clear. IMPRESSION: Patchy periventricular small vessel disease. No evident acute infarct. No mass or hemorrhage. There are foci of arterial vascular calcification. There are areas of paranasal sinus disease. Electronically Signed   By: Lowella Grip III M.D.   On: 03/11/2019 21:07   Mr Brain Wo  Contrast  Result Date: 03/12/2019 CLINICAL DATA:  Initial evaluation for acute weakness with tingling in right hand for 1 week. EXAM: MRI HEAD WITHOUT CONTRAST TECHNIQUE: Multiplanar, multiecho pulse sequences of the brain and surrounding structures were obtained without intravenous contrast. COMPARISON:  Prior head CT from earlier the same day. FINDINGS: Brain: Generalized age-related cerebral atrophy. Patchy and confluent T2/FLAIR hyperintensity within the periventricular and deep white matter both cerebral hemispheres most consistent with chronic small vessel ischemic disease, mild to moderate in nature. No abnormal foci of restricted diffusion to suggest acute or subacute ischemia. Gray-white  matter differentiation maintained. No encephalomalacia to suggest chronic cortical infarction. No foci of susceptibility artifact to suggest acute or chronic intracranial hemorrhage. 2.3 x 2.0 x 0.9 cm well-circumscribed mass positioned along the planum sphenoidale most consistent with a meningioma (series 11, image 10). No associated vasogenic edema or significant regional mass effect. No other mass lesion. No midline shift. No hydrocephalus. No extra-axial fluid collection. Pituitary gland suprasellar region normal. Midline structures intact. Vascular: Major intracranial vascular flow voids are maintained. Skull and upper cervical spine: Craniocervical junction normal. Upper cervical spine within normal limits. Bone marrow signal intensity normal. No scalp soft tissue abnormality. Sinuses/Orbits: Patient status post bilateral ocular lens replacement. Paranasal sinuses are largely clear. No significant mastoid effusion. Inner ear structures normal. Other: None. IMPRESSION: 1. No acute intracranial abnormality. 2. 2.3 cm meningioma along the planum sphenoidale without associated mass effect. 3. Age-related cerebral atrophy with mild to moderate chronic microvascular ischemic disease. Electronically Signed   By: Jeannine Boga M.D.   On: 03/12/2019 00:45    Procedures Procedures (including critical care time)  Medications Ordered in ED Medications  sodium chloride flush (NS) 0.9 % injection 3 mL (has no administration in time range)     Initial Impression / Assessment and Plan / ED Course  I have reviewed the triage vital signs and the nursing notes.  Pertinent labs & imaging results that were available during my care of the patient were reviewed by me and considered in my medical decision making (see chart for details).  Clinical Course as of Mar 11 114  Fri Mar 11, 2019  2323 Daughter reports this is baseline  WBC(!): 2.4 [HM]  2323 HTN in the ED  BP(!): 175/106 [HM]  Sat Mar 12, 2019  0113 Improved  BP(!): 157/98 [HM]    Clinical Course User Index [HM] Leiland Mihelich, Jarrett Soho, [place]       Patient presents with episode of word finding difficulty yesterday.  No additional episodes.  Patient reports she is generally weak but on my exam she has no focal neurologic deficits.  She is well-appearing.  Lab work today and CT scan are without acute abnormalities.  Long conversation with patient and daughter.  At this time I do not believe that an admission to the hospital is in the best interest of the patient given her age and complete resolution of symptoms.  Have offered an MRI here in the emergency department to ensure that we are not missing a subtle stroke.  Patient and family agree.  12:30 AM Discussed with Dr. Malen Gauze who has evaluated the MRI.  No acute stroke is noted.  He does have some concerns about patient's multiple medication usage.  I discussed this with patient and daughter who report the patient is taking no prescription medications at this time.  She is not taking trazodone, Xanax, gabapentin, tramadol or any other medications.  1:14 AM MRI official read with no acute stroke.  Small meningioma.  Discussed findings with patient and daughter.  Recommend restarting hypertension  medications.  Patient is reluctant.  Will write for Diovan which was her last medication.  They are to begin this medication if patient's blood pressures remain greater than 782 systolic.  She has no chest pain or shortness of breath.  She will follow with primary care and go for neurologic.  The patient was discussed with and seen by Dr. Venora Maples who agrees with the treatment plan.   Final Clinical Impressions(s) / ED Diagnoses   Final diagnoses:  Word  finding difficulty  Essential hypertension  Meningioma [facility])    ED Discharge Orders         Ordered    valsartan-hydrochlorothiazide (DIOVAN HCT) 320-25 MG tablet  Daily     03/12/19 0111           Dontay Harm, Gwenlyn Perking 03/12/19 0115    Jola Schmidt, MD 03/12/19 2245

## 2019-03-11 NOTE — Discharge Instructions (Addendum)
Please head to the ER to rule out stroke, transient ischemic attack given your high blood pressure and recent confusion.

## 2019-03-12 MED ORDER — VALSARTAN-HYDROCHLOROTHIAZIDE 320-25 MG PO TABS: 1.0000 | ORAL_TABLET | Freq: Every day | ORAL | 0 refills | Status: DC

## 2019-03-12 NOTE — Discharge Instructions (Addendum)
1. Medications: Diovan -please start this if your blood pressures remain greater than 660 systolic, usual home medications 2. Treatment: rest, drink plenty of fluids,  3. Follow Up: Please followup with your primary doctor in 3 days for discussion of your diagnoses and further evaluation after today's visit; if you do not have a primary care doctor use the resource guide provided to find one; Please return to the ER for return of symptoms or any other concerns.

## 2019-03-12 NOTE — ED Notes (Signed)
Pt discharged from ED; instructions provided and scripts given; Pt encouraged to return to ED if symptoms worsen and to f/u with PCP; Pt verbalized understanding of all instructions 

## 2019-10-09 ENCOUNTER — Ambulatory Visit: Payer: Self-pay | Attending: Internal Medicine

## 2019-10-09 DIAGNOSIS — Z23 Encounter for immunization: Secondary | ICD-10-CM | POA: Insufficient documentation

## 2019-10-09 NOTE — Progress Notes (Signed)
   Covid-19 Vaccination Clinic  Name:  Cassandra Wu    MRN: [id number] DOB: [birth year]  10/09/2019  [name] was observed post Covid-19 immunization for 15 minutes without incidence. She was provided with Vaccine Information Sheet and instruction to access the V-Safe system.   [name] was instructed to call [phone] with any severe reactions post vaccine: Marland Kitchen Difficulty breathing  . Swelling of your face and throat  . A fast heartbeat  . A bad rash all over your body  . Dizziness and weakness    Immunizations Administered    Name Date Dose VIS Date Route   Pfizer COVID-19 Vaccine 10/09/2019  2:30 PM 0.3 mL 07/22/2019 Intramuscular   Manufacturer: [place]   Lot: [id number]   [place]: [id number]

## 2019-11-05 ENCOUNTER — Ambulatory Visit: Payer: Self-pay

## 2019-11-08 ENCOUNTER — Ambulatory Visit: Payer: Medicare HMO

## 2019-11-09 ENCOUNTER — Ambulatory Visit: Payer: Self-pay

## 2020-10-07 IMAGING — MR MRI HEAD WITHOUT CONTRAST
10 of 11 series · 42 of 48 positions shown · non-contrast
Comparison: Prior head CT from earlier the same day.

CLINICAL DATA: Initial evaluation for acute weakness with tingling
in right hand for 1 week.

EXAM:
MRI HEAD WITHOUT CONTRAST
TECHNIQUE: Multiplanar, multiecho pulse sequences of the brain and surrounding
structures were obtained without intravenous contrast.

[Series 5: DWI · axial · 3.0mm · 0.88mm/px · z∈[-61,+71]mm · 9 of 92 slices shown (1 of 4)]
[im 1/92]
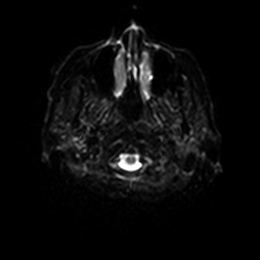
[im 12/92]
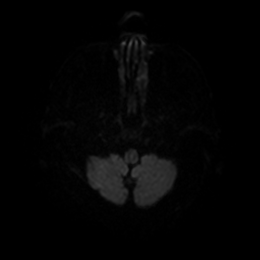
[im 23/92]
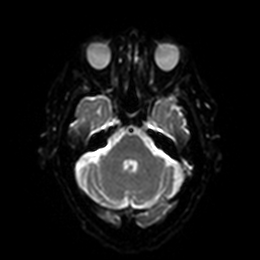
[im 35/92]
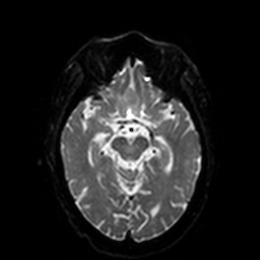
[im 46/92]
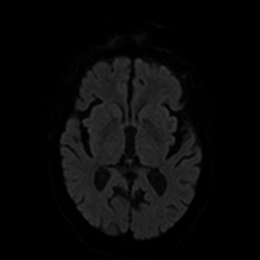
[im 57/92]
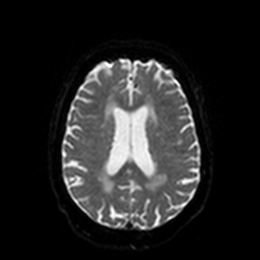
[im 69/92]
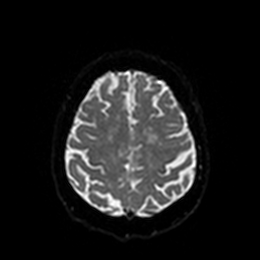
[im 80/92]
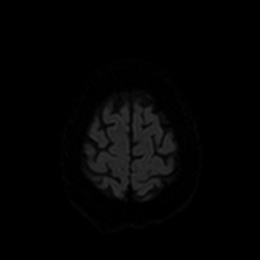
[im 92/92]
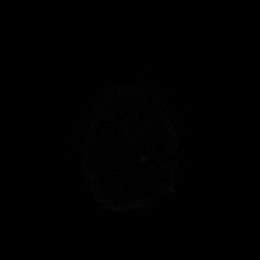

[Series 6: DWI · axial · 3.0mm · 0.88mm/px · z∈[-61,+71]mm · 4 of 46 slices shown (2 of 4)]
[im 1/46]
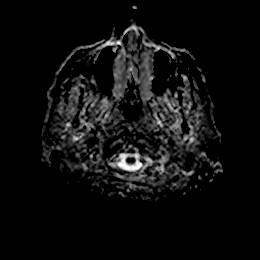
[im 16/46]
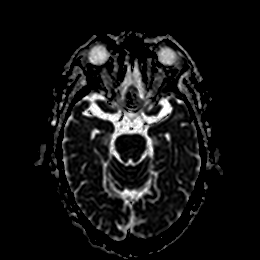
[im 31/46]
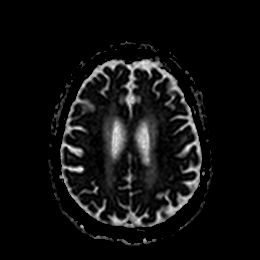
[im 46/46]
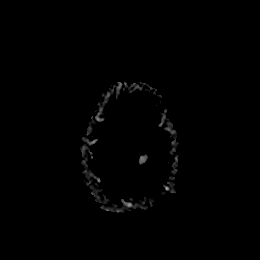

[Series 7: DWI · coronal · 4.0mm · 0.88mm/px · 6 of 68 slices shown (3 of 4)]
[im 1/68]
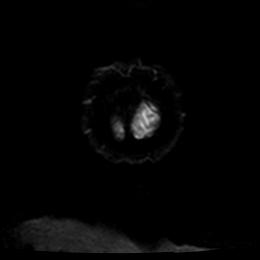
[im 14/68]
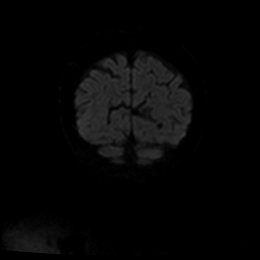
[im 27/68]
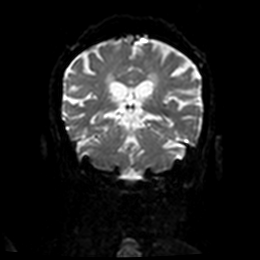
[im 41/68]
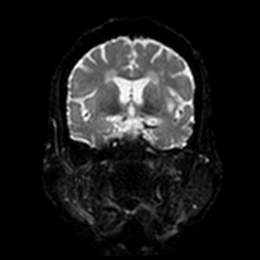
[im 54/68]
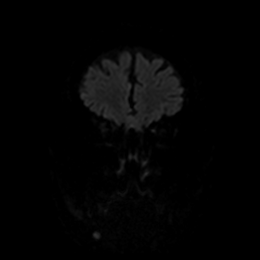
[im 68/68]
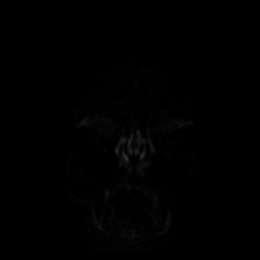

[Series 8: DWI · coronal · 4.0mm · 0.88mm/px · 3 of 34 slices shown (4 of 4)]
[im 1/34]
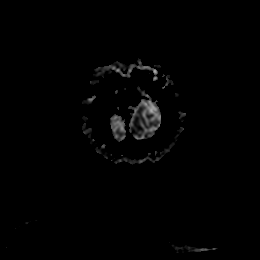
[im 17/34]
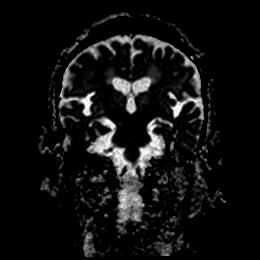
[im 34/34]
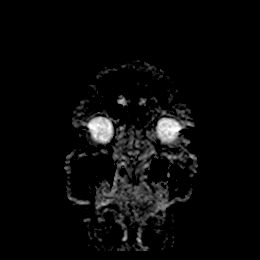

[Series 9: T1 · sagittal · 5.0mm · 0.75mm/px · 2 of 25 slices shown]
[im 1/25]
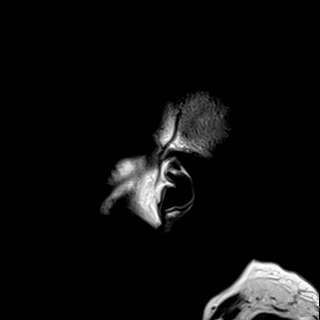
[im 25/25]
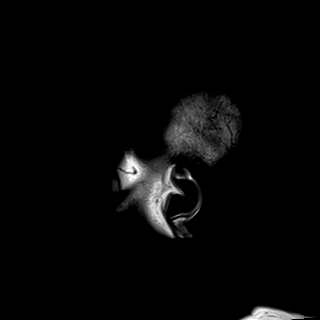

[Series 10: T2 · axial · 5.0mm · 0.72mm/px · z∈[-65,+76]mm · 2 of 25 slices shown (1 of 2)]
[im 1/25]
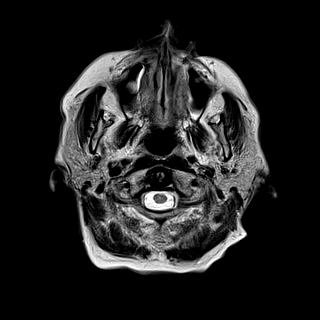
[im 25/25]
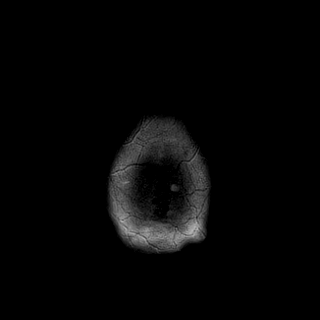

[Series 11: FLAIR · axial · 5.0mm · 0.45mm/px · z∈[-63,+78]mm · 2 of 25 slices shown]
[im 1/25]
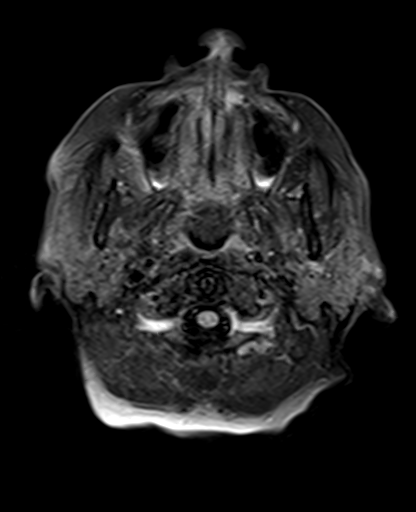
[im 25/25]
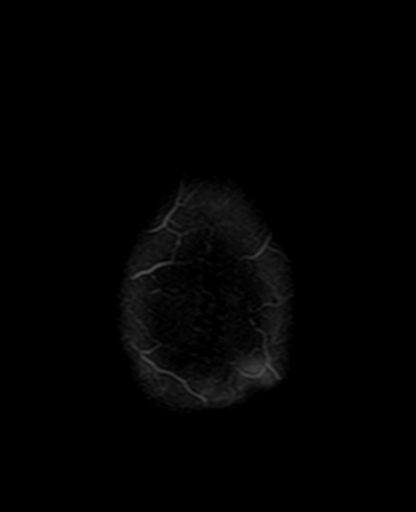

[Series 13: pha_images · axial · 3.0mm · 0.90mm/px · z∈[-76,+91]mm · 5 of 57 slices shown]
[im 1/57]
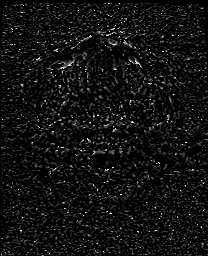
[im 15/57]
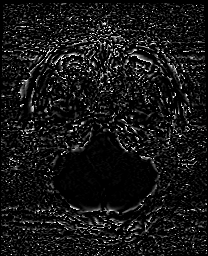
[im 29/57]
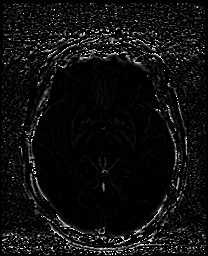
[im 43/57]
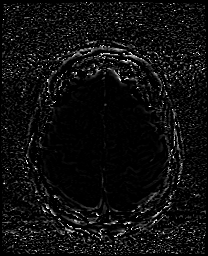
[im 57/57]
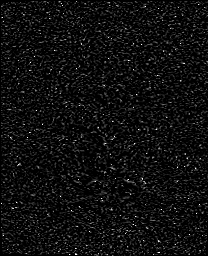

[Series 14: swi_images · axial · 3.0mm · 0.90mm/px · z∈[-79,+94]mm · 6 of 60 slices shown]
[im 1/60]
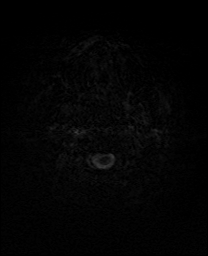
[im 12/60]
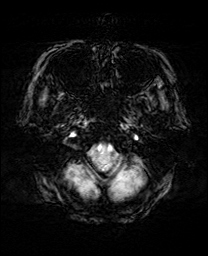
[im 24/60]
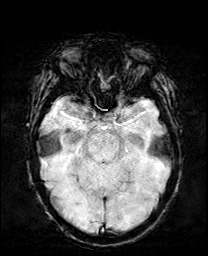
[im 36/60]
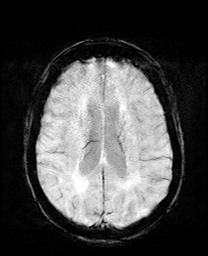
[im 48/60]
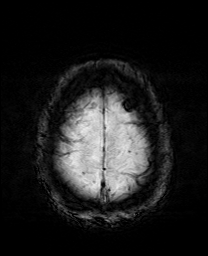
[im 60/60]
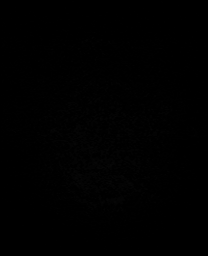

[Series 17: T2 · coronal · 5.0mm · 0.34mm/px · 3 of 30 slices shown (2 of 2)]
[im 1/30]
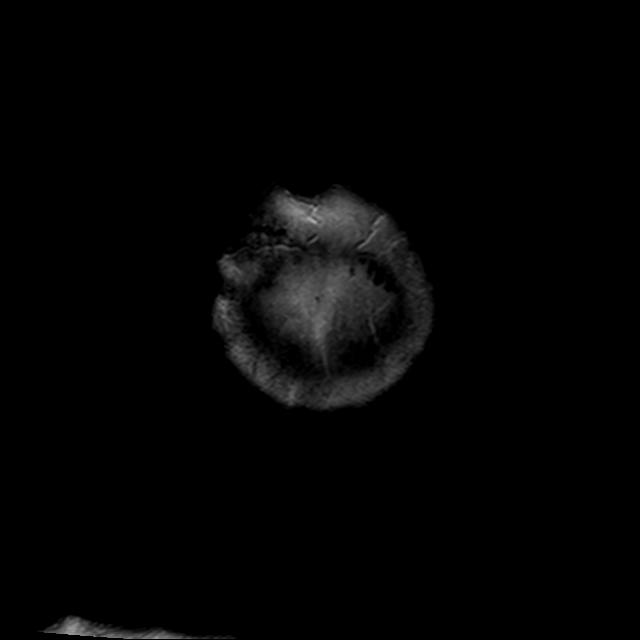
[im 15/30]
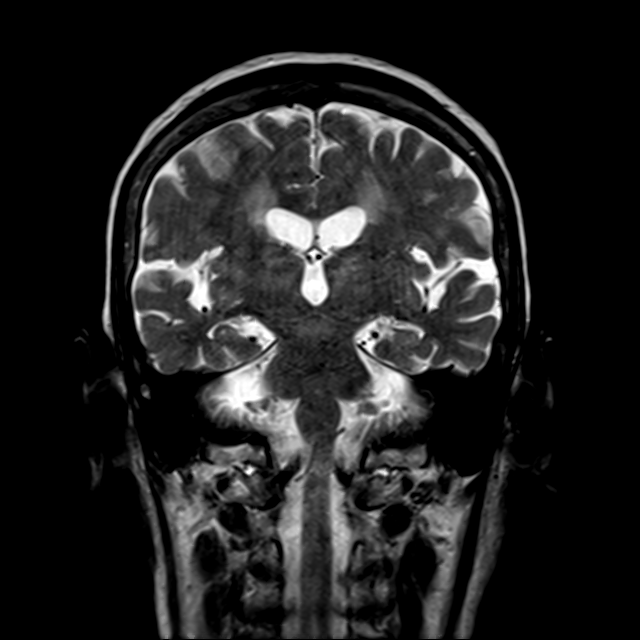
[im 30/30]
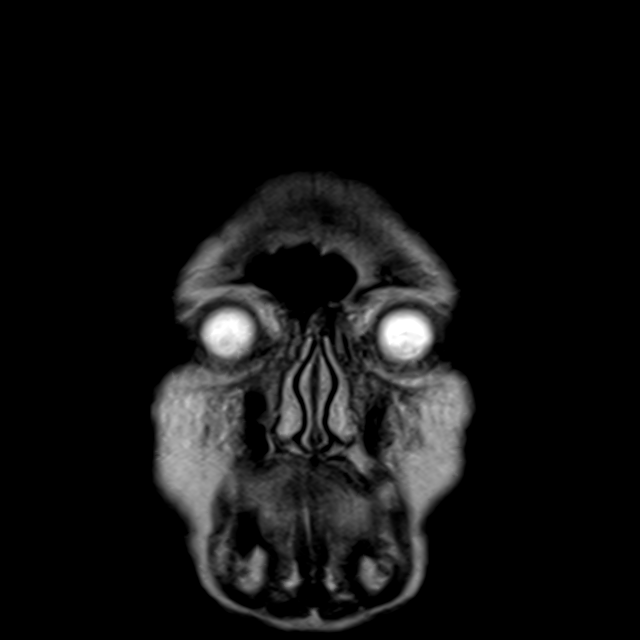

[42 of 48 positions shown; findings below may reference images not displayed]

FINDINGS: Brain: Generalized age-related cerebral atrophy. Patchy and
confluent T2/FLAIR hyperintensity within the periventricular and
deep white matter both cerebral hemispheres most consistent with
chronic small vessel ischemic disease, mild to moderate in nature.

No abnormal foci of restricted diffusion to suggest acute or
subacute ischemia. Gray-white matter differentiation maintained. No
encephalomalacia to suggest chronic cortical infarction. No foci of
susceptibility artifact to suggest acute or chronic intracranial
hemorrhage.

2.3 x 2.0 x 0.9 cm well-circumscribed mass positioned along the
planum sphenoidale most consistent with a meningioma (series 11,
image 10). No associated vasogenic edema or significant regional
mass effect. No other mass lesion. No midline shift. No
hydrocephalus. No extra-axial fluid collection.

Pituitary gland suprasellar region normal. Midline structures
intact.

Vascular: Major intracranial vascular flow voids are maintained.

Skull and upper cervical spine: Craniocervical junction normal.
Upper cervical spine within normal limits. Bone marrow signal
intensity normal. No scalp soft tissue abnormality.

Sinuses/Orbits: Patient status post bilateral ocular lens
replacement. Paranasal sinuses are largely clear. No significant
mastoid effusion. Inner ear structures normal.

Other: None.
IMPRESSION: 1. No acute intracranial abnormality.
2. 2.3 cm meningioma along the planum sphenoidale without associated
mass effect.
3. Age-related cerebral atrophy with mild to moderate chronic
microvascular ischemic disease.

## 2021-08-09 ENCOUNTER — Inpatient Hospital Stay (HOSPITAL_COMMUNITY)
Admission: EM | Admit: 2021-08-09 | Discharge: 2021-08-16 | DRG: 377 | Disposition: A | Discharge status: Skilled Nursing Facility | Payer: Medicare HMO | Attending: Student | Admitting: Student

## 2021-08-09 ENCOUNTER — Encounter (HOSPITAL_COMMUNITY): Payer: Self-pay | Admitting: *Deleted

## 2021-08-09 ENCOUNTER — Emergency Department (HOSPITAL_COMMUNITY): Payer: Medicare HMO

## 2021-08-09 ENCOUNTER — Other Ambulatory Visit: Payer: Self-pay

## 2021-08-09 DIAGNOSIS — Z20822 Contact with and (suspected) exposure to covid-19: Secondary | ICD-10-CM | POA: Diagnosis present

## 2021-08-09 DIAGNOSIS — Z6831 Body mass index (BMI) 31.0-31.9, adult: Secondary | ICD-10-CM

## 2021-08-09 DIAGNOSIS — Z853 Personal history of malignant neoplasm of breast: Secondary | ICD-10-CM

## 2021-08-09 DIAGNOSIS — K319 Disease of stomach and duodenum, unspecified: Secondary | ICD-10-CM | POA: Diagnosis present

## 2021-08-09 DIAGNOSIS — Z880 Allergy status to penicillin: Secondary | ICD-10-CM

## 2021-08-09 DIAGNOSIS — R600 Localized edema: Secondary | ICD-10-CM

## 2021-08-09 DIAGNOSIS — K449 Diaphragmatic hernia without obstruction or gangrene: Secondary | ICD-10-CM | POA: Diagnosis present

## 2021-08-09 DIAGNOSIS — Z9013 Acquired absence of bilateral breasts and nipples: Secondary | ICD-10-CM

## 2021-08-09 DIAGNOSIS — E669 Obesity, unspecified: Secondary | ICD-10-CM | POA: Diagnosis present

## 2021-08-09 DIAGNOSIS — I5032 Chronic diastolic (congestive) heart failure: Secondary | ICD-10-CM

## 2021-08-09 DIAGNOSIS — Z79899 Other long term (current) drug therapy: Secondary | ICD-10-CM

## 2021-08-09 DIAGNOSIS — R609 Edema, unspecified: Secondary | ICD-10-CM | POA: Diagnosis present

## 2021-08-09 DIAGNOSIS — I5033 Acute on chronic diastolic (congestive) heart failure: Secondary | ICD-10-CM | POA: Diagnosis present

## 2021-08-09 DIAGNOSIS — T39395A Adverse effect of other nonsteroidal anti-inflammatory drugs [NSAID], initial encounter: Secondary | ICD-10-CM | POA: Diagnosis present

## 2021-08-09 DIAGNOSIS — R0600 Dyspnea, unspecified: Secondary | ICD-10-CM | POA: Diagnosis present

## 2021-08-09 DIAGNOSIS — K250 Acute gastric ulcer with hemorrhage: Secondary | ICD-10-CM | POA: Diagnosis not present

## 2021-08-09 DIAGNOSIS — D62 Acute posthemorrhagic anemia: Secondary | ICD-10-CM | POA: Diagnosis present

## 2021-08-09 DIAGNOSIS — R195 Other fecal abnormalities: Secondary | ICD-10-CM

## 2021-08-09 DIAGNOSIS — E871 Hypo-osmolality and hyponatremia: Secondary | ICD-10-CM | POA: Diagnosis present

## 2021-08-09 DIAGNOSIS — D649 Anemia, unspecified: Secondary | ICD-10-CM | POA: Diagnosis present

## 2021-08-09 DIAGNOSIS — G5600 Carpal tunnel syndrome, unspecified upper limb: Secondary | ICD-10-CM | POA: Diagnosis present

## 2021-08-09 DIAGNOSIS — D509 Iron deficiency anemia, unspecified: Secondary | ICD-10-CM | POA: Diagnosis present

## 2021-08-09 DIAGNOSIS — K5900 Constipation, unspecified: Secondary | ICD-10-CM | POA: Diagnosis present

## 2021-08-09 DIAGNOSIS — K922 Gastrointestinal hemorrhage, unspecified: Secondary | ICD-10-CM | POA: Diagnosis present

## 2021-08-09 DIAGNOSIS — I10 Essential (primary) hypertension: Secondary | ICD-10-CM | POA: Diagnosis present

## 2021-08-09 DIAGNOSIS — K2100 Gastro-esophageal reflux disease with esophagitis, without bleeding: Secondary | ICD-10-CM | POA: Diagnosis present

## 2021-08-09 DIAGNOSIS — Z888 Allergy status to other drugs, medicaments and biological substances status: Secondary | ICD-10-CM

## 2021-08-09 DIAGNOSIS — E876 Hypokalemia: Secondary | ICD-10-CM | POA: Diagnosis present

## 2021-08-09 DIAGNOSIS — E663 Overweight: Secondary | ICD-10-CM | POA: Diagnosis present

## 2021-08-09 DIAGNOSIS — I110 Hypertensive heart disease with heart failure: Secondary | ICD-10-CM | POA: Diagnosis present

## 2021-08-09 LAB — TROPONIN I (HIGH SENSITIVITY)
Troponin I (High Sensitivity): 15 ng/L
Troponin I (High Sensitivity): 14 ng/L (ref <18)

## 2021-08-09 LAB — BASIC METABOLIC PANEL
Anion gap: 6 (ref 5–15)
BUN: 15 mg/dL (ref 8–23)
CO2: 24 mmol/L (ref 22–32)
Calcium: 9.2 mg/dL (ref 8.9–10.3)
Chloride: 101 mmol/L (ref 98–111)
Creatinine, Ser: 0.64 mg/dL (ref 0.44–1.00)
GFR, Estimated: >60 mL/min (ref >60)
Glucose, Bld: 100 mg/dL — ABNORMAL HIGH (ref 70–99)
Potassium: 4.3 mmol/L (ref 3.5–5.1)
Sodium: 131 mmol/L — ABNORMAL LOW (ref 135–145)

## 2021-08-09 LAB — CBC
HCT: 26.8 % — ABNORMAL LOW (ref 36.0–46.0)
Hemoglobin: 8.1 g/dL — ABNORMAL LOW (ref 12.0–15.0)
MCH: 24.6 pg — ABNORMAL LOW (ref 26.0–34.0)
MCHC: 30.2 g/dL (ref 30.0–36.0)
MCV: 81.5 fL (ref 80.0–100.0)
Platelets: 193 K/uL (ref 150–400)
RBC: 3.29 MIL/uL — ABNORMAL LOW (ref 3.87–5.11)
RDW: 15.2 % (ref 11.5–15.5)
WBC: 3.1 K/uL — ABNORMAL LOW (ref 4.0–10.5)
nRBC: 0 % (ref 0.0–0.2)

## 2021-08-09 LAB — BRAIN NATRIURETIC PEPTIDE: B Natriuretic Peptide: 159 pg/mL — ABNORMAL HIGH (ref 0.0–100.0)

## 2021-08-09 NOTE — ED Triage Notes (Signed)
The pt is c/o some sob bi-lateral leg swelling and hx of chf

## 2021-08-09 NOTE — ED Provider Notes (Signed)
Emergency Medicine Provider Triage Evaluation Note  [place] , a 87 y.o. female  was evaluated in triage.  Pt complains of shortness of breath and bilateral lower leg swelling.  Does have history of CHF.  No chest pain, fever, chills, abdominal pain, nausea, vomiting or diarrhea.  Review of Systems  Positive:  Negative: See above   Physical Exam  BP (!) 173/100 (BP Location: Right Arm)    Pulse 71    Temp 98.5 F (36.9 C) (Oral)    Resp 18    Ht 5\' 4"  (1.626 m)    Wt 83.9 kg    SpO2 99%    BMI 31.75 kg/m  Gen:   Awake, no distress   Resp:  Normal effort  MSK:   Moves extremities without difficulty  Other:  3+ pitting edema up to the bilateral knees.  Medical Decision Making  Medically screening exam initiated at 7:15 PM.  Appropriate orders placed.  Cassandra Wu was informed that the remainder of the evaluation will be completed by another provider, this initial triage assessment does not replace that evaluation, and the importance of remaining in the ED until their evaluation is complete.     Hendricks Limes, PA-C 08/09/21 1915    Lorelle Gibbs, DO 08/10/21 [phone]

## 2021-08-10 ENCOUNTER — Encounter (HOSPITAL_COMMUNITY): Payer: Self-pay | Admitting: Family Medicine

## 2021-08-10 DIAGNOSIS — Z9013 Acquired absence of bilateral breasts and nipples: Secondary | ICD-10-CM | POA: Diagnosis not present

## 2021-08-10 DIAGNOSIS — Z20822 Contact with and (suspected) exposure to covid-19: Secondary | ICD-10-CM | POA: Diagnosis present

## 2021-08-10 DIAGNOSIS — I5033 Acute on chronic diastolic (congestive) heart failure: Secondary | ICD-10-CM | POA: Diagnosis present

## 2021-08-10 DIAGNOSIS — D509 Iron deficiency anemia, unspecified: Secondary | ICD-10-CM | POA: Diagnosis present

## 2021-08-10 DIAGNOSIS — K259 Gastric ulcer, unspecified as acute or chronic, without hemorrhage or perforation: Secondary | ICD-10-CM | POA: Diagnosis not present

## 2021-08-10 DIAGNOSIS — E876 Hypokalemia: Secondary | ICD-10-CM | POA: Diagnosis present

## 2021-08-10 DIAGNOSIS — K254 Chronic or unspecified gastric ulcer with hemorrhage: Secondary | ICD-10-CM | POA: Diagnosis not present

## 2021-08-10 DIAGNOSIS — Z888 Allergy status to other drugs, medicaments and biological substances status: Secondary | ICD-10-CM | POA: Diagnosis not present

## 2021-08-10 DIAGNOSIS — E871 Hypo-osmolality and hyponatremia: Secondary | ICD-10-CM | POA: Diagnosis present

## 2021-08-10 DIAGNOSIS — K222 Esophageal obstruction: Secondary | ICD-10-CM | POA: Diagnosis not present

## 2021-08-10 DIAGNOSIS — R600 Localized edema: Secondary | ICD-10-CM

## 2021-08-10 DIAGNOSIS — K2100 Gastro-esophageal reflux disease with esophagitis, without bleeding: Secondary | ICD-10-CM | POA: Diagnosis present

## 2021-08-10 DIAGNOSIS — D649 Anemia, unspecified: Secondary | ICD-10-CM

## 2021-08-10 DIAGNOSIS — R0609 Other forms of dyspnea: Secondary | ICD-10-CM | POA: Diagnosis not present

## 2021-08-10 DIAGNOSIS — R195 Other fecal abnormalities: Secondary | ICD-10-CM | POA: Diagnosis not present

## 2021-08-10 DIAGNOSIS — Z853 Personal history of malignant neoplasm of breast: Secondary | ICD-10-CM | POA: Diagnosis not present

## 2021-08-10 DIAGNOSIS — Z6831 Body mass index (BMI) 31.0-31.9, adult: Secondary | ICD-10-CM | POA: Diagnosis not present

## 2021-08-10 DIAGNOSIS — I10 Essential (primary) hypertension: Secondary | ICD-10-CM | POA: Diagnosis not present

## 2021-08-10 DIAGNOSIS — Z880 Allergy status to penicillin: Secondary | ICD-10-CM | POA: Diagnosis not present

## 2021-08-10 DIAGNOSIS — K922 Gastrointestinal hemorrhage, unspecified: Secondary | ICD-10-CM

## 2021-08-10 DIAGNOSIS — E663 Overweight: Secondary | ICD-10-CM | POA: Diagnosis present

## 2021-08-10 DIAGNOSIS — K250 Acute gastric ulcer with hemorrhage: Secondary | ICD-10-CM | POA: Diagnosis present

## 2021-08-10 DIAGNOSIS — G5600 Carpal tunnel syndrome, unspecified upper limb: Secondary | ICD-10-CM | POA: Diagnosis present

## 2021-08-10 DIAGNOSIS — T39395A Adverse effect of other nonsteroidal anti-inflammatory drugs [NSAID], initial encounter: Secondary | ICD-10-CM | POA: Diagnosis present

## 2021-08-10 DIAGNOSIS — R0600 Dyspnea, unspecified: Secondary | ICD-10-CM | POA: Diagnosis present

## 2021-08-10 DIAGNOSIS — I110 Hypertensive heart disease with heart failure: Secondary | ICD-10-CM | POA: Diagnosis present

## 2021-08-10 DIAGNOSIS — E669 Obesity, unspecified: Secondary | ICD-10-CM | POA: Diagnosis present

## 2021-08-10 DIAGNOSIS — D500 Iron deficiency anemia secondary to blood loss (chronic): Secondary | ICD-10-CM | POA: Diagnosis not present

## 2021-08-10 DIAGNOSIS — Z79899 Other long term (current) drug therapy: Secondary | ICD-10-CM | POA: Diagnosis not present

## 2021-08-10 DIAGNOSIS — K319 Disease of stomach and duodenum, unspecified: Secondary | ICD-10-CM | POA: Diagnosis present

## 2021-08-10 DIAGNOSIS — K449 Diaphragmatic hernia without obstruction or gangrene: Secondary | ICD-10-CM | POA: Diagnosis present

## 2021-08-10 DIAGNOSIS — D62 Acute posthemorrhagic anemia: Secondary | ICD-10-CM | POA: Diagnosis present

## 2021-08-10 DIAGNOSIS — K5900 Constipation, unspecified: Secondary | ICD-10-CM | POA: Diagnosis present

## 2021-08-10 LAB — CBC
HCT: 26.1 % — ABNORMAL LOW (ref 36.0–46.0)
Hemoglobin: 8.4 g/dL — ABNORMAL LOW (ref 12.0–15.0)
MCH: 25.5 pg — ABNORMAL LOW (ref 26.0–34.0)
MCHC: 32.2 g/dL (ref 30.0–36.0)
MCV: 79.3 fL — ABNORMAL LOW (ref 80.0–100.0)
Platelets: 194 10*3/uL (ref 150–400)
RBC: 3.29 MIL/uL — ABNORMAL LOW (ref 3.87–5.11)
RDW: 15.3 % (ref 11.5–15.5)
WBC: 2.6 10*3/uL — ABNORMAL LOW (ref 4.0–10.5)
nRBC: 0 % (ref 0.0–0.2)

## 2021-08-10 LAB — RETICULOCYTES
Immature Retic Fract: 16.8 % — ABNORMAL HIGH (ref 2.3–15.9)
RBC.: 3.24 MIL/uL — ABNORMAL LOW (ref 3.87–5.11)
Retic Count, Absolute: 43.7 10*3/uL (ref 19.0–186.0)
Retic Ct Pct: 1.4 % (ref 0.4–3.1)

## 2021-08-10 LAB — VITAMIN B12: Vitamin B-12: 608 pg/mL (ref 180–914)

## 2021-08-10 LAB — RESP PANEL BY RT-PCR (FLU A&B, COVID) ARPGX2
Influenza A by PCR: NEGATIVE
Influenza B by PCR: NEGATIVE
SARS Coronavirus 2 by RT PCR: NEGATIVE

## 2021-08-10 LAB — IRON AND TIBC
Iron: 25 ug/dL — ABNORMAL LOW (ref 28–170)
Saturation Ratios: 8 % — ABNORMAL LOW (ref 10.4–31.8)
TIBC: 329 ug/dL (ref 250–450)
UIBC: 304 ug/dL

## 2021-08-10 LAB — D-DIMER, QUANTITATIVE: D-Dimer, Quant: 1.92 ug/mL-FEU — ABNORMAL HIGH (ref 0.00–0.50)

## 2021-08-10 LAB — POC OCCULT BLOOD, ED: Fecal Occult Bld: POSITIVE — AB

## 2021-08-10 LAB — TSH: TSH: 1.648 u[IU]/mL (ref 0.350–4.500)

## 2021-08-10 LAB — FERRITIN: Ferritin: 10 ng/mL — ABNORMAL LOW (ref 11–307)

## 2021-08-10 LAB — HEMOGLOBIN AND HEMATOCRIT, BLOOD
HCT: 23.9 % — ABNORMAL LOW (ref 36.0–46.0)
Hemoglobin: 7.5 g/dL — ABNORMAL LOW (ref 12.0–15.0)

## 2021-08-10 LAB — FOLATE: Folate: 36.9 ng/mL (ref >5.9)

## 2021-08-10 MED ORDER — PANTOPRAZOLE SODIUM 40 MG IV SOLR
40.0000 mg | INTRAVENOUS | Status: DC
  Administered 2021-08-10 – 2021-08-12 (×3): 40 mg via INTRAVENOUS
  Filled 2021-08-10 (×3): qty 40

## 2021-08-10 MED ORDER — VALSARTAN-HYDROCHLOROTHIAZIDE 320-25 MG PO TABS: 1.0000 | ORAL_TABLET | Freq: Every day | ORAL | Status: DC

## 2021-08-10 MED ORDER — ACETAMINOPHEN 325 MG PO TABS
650.0000 mg | ORAL_TABLET | Freq: Four times a day (QID) | ORAL | Status: DC | PRN
  Administered 2021-08-11 – 2021-08-16 (×12): 650 mg via ORAL
  Filled 2021-08-10 (×12): qty 2

## 2021-08-10 MED ORDER — ACETAMINOPHEN 325 MG PO TABS
650.0000 mg | ORAL_TABLET | Freq: Four times a day (QID) | ORAL | Status: DC | PRN
  Administered 2021-08-10: 650 mg via ORAL
  Filled 2021-08-10: qty 2

## 2021-08-10 MED ORDER — ASPIRIN EC 81 MG PO TBEC
81.0000 mg | DELAYED_RELEASE_TABLET | Freq: Every day | ORAL | Status: DC
  Administered 2021-08-10 – 2021-08-16 (×7): 81 mg via ORAL
  Filled 2021-08-10 (×7): qty 1

## 2021-08-10 MED ORDER — ONDANSETRON HCL 4 MG PO TABS: 4.0000 mg | ORAL_TABLET | Freq: Four times a day (QID) | ORAL | Status: DC | PRN

## 2021-08-10 MED ORDER — FUROSEMIDE 10 MG/ML IJ SOLN
40.0000 mg | Freq: Two times a day (BID) | INTRAMUSCULAR | Status: DC
  Administered 2021-08-10 – 2021-08-11 (×4): 40 mg via INTRAVENOUS
  Filled 2021-08-10 (×4): qty 4

## 2021-08-10 MED ORDER — ENOXAPARIN SODIUM 40 MG/0.4ML IJ SOSY
40.0000 mg | PREFILLED_SYRINGE | INTRAMUSCULAR | Status: DC
  Administered 2021-08-10: 40 mg via SUBCUTANEOUS
  Filled 2021-08-10 (×2): qty 0.4

## 2021-08-10 MED ORDER — SENNOSIDES-DOCUSATE SODIUM 8.6-50 MG PO TABS: 1.0000 | ORAL_TABLET | Freq: Every evening | ORAL | Status: DC | PRN

## 2021-08-10 MED ORDER — ALPRAZOLAM 0.25 MG PO TABS
0.2500 mg | ORAL_TABLET | Freq: Two times a day (BID) | ORAL | Status: DC
  Administered 2021-08-10 – 2021-08-16 (×13): 0.25 mg via ORAL
  Filled 2021-08-10 (×13): qty 1

## 2021-08-10 MED ORDER — FUROSEMIDE 10 MG/ML IJ SOLN
20.0000 mg | Freq: Once | INTRAMUSCULAR | Status: AC
  Administered 2021-08-10: 20 mg via INTRAVENOUS
  Filled 2021-08-10: qty 2

## 2021-08-10 MED ORDER — SODIUM CHLORIDE 0.9% FLUSH
3.0000 mL | Freq: Two times a day (BID) | INTRAVENOUS | Status: DC
  Administered 2021-08-10 – 2021-08-16 (×12): 3 mL via INTRAVENOUS

## 2021-08-10 MED ORDER — PANTOPRAZOLE SODIUM 40 MG PO TBEC: 40.0000 mg | DELAYED_RELEASE_TABLET | Freq: Every day | ORAL | Status: DC

## 2021-08-10 MED ORDER — SODIUM CHLORIDE 0.9 % IV SOLN: 250.0000 mL | INTRAVENOUS | Status: DC | PRN

## 2021-08-10 MED ORDER — ACETAMINOPHEN 650 MG RE SUPP: 650.0000 mg | Freq: Four times a day (QID) | RECTAL | Status: DC | PRN

## 2021-08-10 MED ORDER — SODIUM CHLORIDE 0.9% FLUSH: 3.0000 mL | INTRAVENOUS | Status: DC | PRN

## 2021-08-10 MED ORDER — ONDANSETRON HCL 4 MG/2ML IJ SOLN: 4.0000 mg | Freq: Four times a day (QID) | INTRAMUSCULAR | Status: DC | PRN

## 2021-08-10 NOTE — Assessment & Plan Note (Addendum)
Quite significant.  Awaiting echocardiogram.  Has diuresed almost 5 L in the last 24 hours.

## 2021-08-10 NOTE — Assessment & Plan Note (Addendum)
We will see if we can get some recent labs.  Gastroenterology following.  Hemoccult positive.  Following H&H.  Plans for possible endoscopy/colonoscopy Tuesday or Wednesday.  Transfuse for hemoglobin below 7.  Getting IV iron.

## 2021-08-10 NOTE — Consult Note (Addendum)
Consultation  Referring Provider:  [facility] [place] [facility] Primary Care Physician:  Joseph Art, MD Primary Gastroenterologist:  none  Reason for Consultation:  anemia, SOB  HPI: Cassandra Wu is a 87 y.o. female, who has been admitted through the emergency room after presenting yesterday afternoon with progressive shortness of breath, weakness and swelling in her lower extremities.  Symptoms have been present and worsening over the past 1 to 2 weeks.  Patient resides in [place], has family here in [place] and family wanted her to come here for care. She has history of hypertension, breast cancer and is status postmastectomy.  She says that she had a remote bowel obstruction and required surgery for that with bowel resection in [place] but does not recall what the underlying issue was. Work-up in the emergency room with chest x-ray, unremarkable Respiratory panel negative Troponin within normal limits, BNP 159 Stool for occult blood positive WBC 3.1/hemoglobin 8.1/hematocrit 26.8/platelets 193/MCV 81 Last hemoglobin in our system was in 2020 with hemoglobin 13.2  Patient says that she does have remote prior history of anemia and had been on iron supplementation at 1 point per her primary care doctor but has not been on iron over the past couple of years.  She has had prior colonoscopies though these were remote as well done by Dr. Deforest Hoyles I believe in [place]. Very remote "upper GI" and says at one point she was told to come off of pancakes bread etc.  She was never told that she had celiac disease. Appetite recently has been okay she does have some intermittent reflux symptoms and generally uses Pepto-Bismol for that no dysphagia or odynophagia.  No complaints of abdominal pain.  Stools tend to stay constipated and she has noticed some darker stools off and on but no overt bleeding or melena. She takes a dual action Advil 1 tablet daily which contains ibuprofen and Tylenol and  then reviewing her med list has been prescribed Voltaren twice daily, no blood thinners.. No EtOH use.  Past Medical History:  Diagnosis Date   Breast cancer ([place])    Carpal tunnel syndrome    Hypertension     Past Surgical History:  Procedure Laterality Date   left breast mastectomy (other)     TONSILLECTOMY      Prior to Admission medications   Medication Sig Start Date End Date Taking? Authorizing Provider  diclofenac (VOLTAREN) 75 MG EC tablet Take 75 mg by mouth 2 (two) times daily as needed. 07/24/21  Yes [provider]  metoprolol succinate (TOPROL-XL) 25 MG 24 hr tablet Take 25 mg by mouth daily. 07/02/21  Yes [provider]  ALPRAZolam (XANAX) 0.25 MG tablet Take 0.25 mg by mouth 2 (two) times daily.      [provider]  ARTIFICIAL TEARS ophthalmic solution 1 drop as needed.      [provider]  Calcium Carbonate-Vitamin D (CALTRATE 600+D) 600-400 MG-UNIT per tablet Take 1 tablet by mouth daily.      [provider]  ibuprofen (ADVIL) 200 MG tablet Take 200 mg by mouth as needed.      [provider]  Multiple Vitamins-Minerals (MULTIVITAL) tablet Take 1 tablet by mouth daily.      [provider]  senna-docusate (SENOKOT S) 8.6-50 MG per tablet Take 2 tablets by mouth daily.      [provider]  valsartan-hydrochlorothiazide (DIOVAN HCT) 320-25 MG tablet Take 1 tablet by mouth daily. 03/12/19   Muthersbaugh, Jarrett Soho, PA-C  gabapentin (NEURONTIN) 300 MG capsule Take 300 mg by mouth 3 (three) times daily.    03/12/19  [provider]    Current Facility-Administered Medications  Medication Dose Route Frequency Provider Last Rate Last Admin   acetaminophen (TYLENOL) tablet 650 mg  650 mg Oral Q6H PRN Annita Brod, MD   650 mg at 08/10/21 0945   Current Outpatient Medications  Medication Sig Dispense Refill   diclofenac (VOLTAREN) 75 MG EC tablet Take 75 mg by mouth 2 (two) times daily as  needed.     metoprolol succinate (TOPROL-XL) 25 MG 24 hr tablet Take 25 mg by mouth daily.     ALPRAZolam (XANAX) 0.25 MG tablet Take 0.25 mg by mouth 2 (two) times daily.       ARTIFICIAL TEARS ophthalmic solution 1 drop as needed.       Calcium Carbonate-Vitamin D (CALTRATE 600+D) 600-400 MG-UNIT per tablet Take 1 tablet by mouth daily.       ibuprofen (ADVIL) 200 MG tablet Take 200 mg by mouth as needed.       Multiple Vitamins-Minerals (MULTIVITAL) tablet Take 1 tablet by mouth daily.       senna-docusate (SENOKOT S) 8.6-50 MG per tablet Take 2 tablets by mouth daily.       valsartan-hydrochlorothiazide (DIOVAN HCT) 320-25 MG tablet Take 1 tablet by mouth daily. 15 tablet 0    Allergies as of 08/09/2021 - Review Complete 08/09/2021  Allergen Reaction Noted   Penicillins  03/19/2010   Propranolol hcl  03/19/2010    History reviewed. No pertinent family history.  Social History   Socioeconomic History   Marital status: Widowed    Spouse name: Not on file   Number of children: Not on file   Years of education: Not on file   Highest education level: Not on file  Occupational History   Not on file  Tobacco Use   Smoking status: Never   Smokeless tobacco: Never  Substance and Sexual Activity   Alcohol use: No   Drug use: Not on file   Sexual activity: Not on file  Other Topics Concern   Not on file  Social History Narrative   Widowed.    Social Determinants of Health   Financial Resource Strain: Not on file  Food Insecurity: Not on file  Transportation Needs: Not on file  Physical Activity: Not on file  Stress: Not on file  Social Connections: Not on file  Intimate Partner Violence: Not on file    Review of Systems: .pau Physical Exam: Vital signs in last 24 hours: Temp:  [98.3 F (36.8 C)-98.5 F (36.9 C)] 98.3 F (36.8 C) (12/31 0713) Pulse Rate:  [70-87] 87 (12/31 1100) Resp:  [16-23] 22 (12/31 1100) BP: (133-176)/(79-100) 141/85 (12/31 1100) SpO2:  [95  %-100 %] 96 % (12/31 1100) Weight:  [83.9 kg] 83.9 kg (12/30 1839)   General:   Alert,  Well-developed, well-nourished, very elderly female pleasant and cooperative in NAD Head:  Normocephalic and atraumatic. Eyes:  Sclera clear, no icterus.   Conjunctiva pale Ears:  Normal auditory acuity. Nose:  No deformity, discharge,  or lesions. Mouth:  No deformity or lesions.   Neck:  Supple; no masses or thyromegaly. Lungs:  Clear throughout to auscultation.   No wheezes, crackles, or rhonchi.  Heart:  Regular rate and rhythm; no murmurs, clicks, rubs,  or gallops. Abdomen:  Soft, mild tenderness in the lower abdomen BS active,nonpalp mass or hsm. Incisional scar Rectal: Not done,  documented heme positive Msk:  Symmetrical without gross deformities. . Pulses:  Normal pulses noted. Extremities: 1+ edema bilateral lower extremities Neurologic:  Alert and  oriented x4;  grossly normal neurologically. Skin:  Intact without significant lesions or rashes.. Psych:  Alert and cooperative. Normal mood and affect.  Intake/Output from previous day: 12/30 0701 - 12/31 0700 In: -  Out: 3846 [Urine:1650] Intake/Output this shift: No intake/output data recorded.  Lab Results: Recent Labs    08/09/21 1847  WBC 3.1*  HGB 8.1*  HCT 26.8*  PLT 193   BMET Recent Labs    08/09/21 1847  NA 131*  K 4.3  CL 101  CO2 24  GLUCOSE 100*  BUN 15  CREATININE 0.64  CALCIUM 9.2     IMPRESSION:  #61 87 year old female with normocytic anemia and heme positive stool.  Chronicity of the anemia is not known had a normal hemoglobin in 2020. Patient does endorse intermittent dark stools at home but has also been using Pepto-Bismol as needed. Unclear whether heme positive stool represents upper versus lower GI pathology.  She does take ibuprofen 1 daily and has prescription for Voltaren, consider NSAID induced gastropathy/ulcer  #2 dyspnea-chest x-ray negative, BNP elevated, #3 lower extremity edema #4  hypertension #5  History of breast cancer status postmastectomy #6 status post remote bowel obstruction with resection #7 remote history of anemia details not known  Plan:  Regular diet 2D echo Check anemia panel Start IV PPI once daily Trend hemoglobin and transfuse for hemoglobin 7.5 or less due to advanced age She may need EGD and colonoscopy during this admission, could start with EGD.  She is not opposed to having endoscopic evaluation if felt indicated. Will await iron studies etc., then determine timing of endoscopic evaluation.   Amy Esterwood PA-C 08/10/2021, 11:37 AM    Attending Physician Note   I have taken a history, reviewed the chart and examined the patient. I personally saw the patient and performed a substantive portion of this encounter, including a complete performance of at least one of the key components, in conjunction with the APP. I agree with the APP's note, impression and recommendations.   Normocytic anemia and heme positive stools with history of daily ibuprofen and occasional reflux symptoms. Dark stools noted however she takes Pepto Bismol prn. Pantoprazole 40 mg IV qd. Stop NSAIDs and Pepto Bismol. Check anemia panel. Await 2D echo. Consider EGD and if no source consider colonoscopy.   Lucio Edward, MD [facility] See [facility], [facility], for our on call provider

## 2021-08-10 NOTE — ED Notes (Signed)
Family updated as to patient's status.

## 2021-08-10 NOTE — Plan of Care (Signed)
  Problem: Education: Goal: Ability to demonstrate management of disease process will improve Outcome: Not Progressing Goal: Ability to verbalize understanding of medication therapies will improve Outcome: Not Progressing Goal: Individualized Educational Video(s) Outcome: Not Progressing   Problem: Activity: Goal: Capacity to carry out activities will improve Outcome: Not Progressing   Problem: Cardiac: Goal: Ability to achieve and maintain adequate cardiopulmonary perfusion will improve Outcome: Not Progressing   Problem: Education: Goal: Knowledge of General Education information will improve Description: Including pain rating scale, medication(s)/side effects and non-pharmacologic comfort measures Outcome: Not Progressing   Problem: Health Behavior/Discharge Planning: Goal: Ability to manage health-related needs will improve Outcome: Not Progressing   Problem: Clinical Measurements: Goal: Ability to maintain clinical measurements within normal limits will improve Outcome: Not Progressing Goal: Will remain free from infection Outcome: Not Progressing Goal: Diagnostic test results will improve Outcome: Not Progressing Goal: Respiratory complications will improve Outcome: Not Progressing Goal: Cardiovascular complication will be avoided Outcome: Not Progressing   Problem: Activity: Goal: Risk for activity intolerance will decrease Outcome: Not Progressing   Problem: Nutrition: Goal: Adequate nutrition will be maintained Outcome: Not Progressing   Problem: Coping: Goal: Level of anxiety will decrease Outcome: Not Progressing   Problem: Elimination: Goal: Will not experience complications related to bowel motility Outcome: Not Progressing Goal: Will not experience complications related to urinary retention Outcome: Not Progressing   Problem: Pain Managment: Goal: General experience of comfort will improve Outcome: Not Progressing   Problem: Safety: Goal:  Ability to remain free from injury will improve Outcome: Not Progressing   Problem: Skin Integrity: Goal: Risk for impaired skin integrity will decrease Outcome: Not Progressing   

## 2021-08-10 NOTE — ED Notes (Signed)
ED TO INPATIENT HANDOFF REPORT  ED Nurse Name and Phone #: Javari Bufkin   S Name/Age/Gender Cassandra Wu 87 y.o. female Room/Bed: 040C/040C  Code Status   Code Status: Full Code  Home/SNF/Other Home Patient oriented to: self, place, time, and situation Is this baseline? Yes   Triage Complete: Triage complete  Chief Complaint GI bleed [K92.2]  Triage Note The pt is c/o some sob bi-lateral leg swelling and hx of chf   Allergies Allergies  Allergen Reactions   Penicillins    Propranolol Hcl     Level of Care/Admitting Diagnosis ED Disposition     ED Disposition  Admit   Condition  --   [facility] Area: [place] [[id number]]  Level of Care: Telemetry Medical [104]  May admit patient to [facility] or [facility] if equivalent level of care is available:: Yes  Covid Evaluation: Asymptomatic Screening Protocol (No Symptoms)  Diagnosis: GI bleed [559741]  Admitting Physician: Eben Burow [[id number]]  Attending Physician: Eben Burow [[id number]]  Estimated length of stay: past midnight tomorrow  Certification:: I certify this patient will need inpatient services for at least 2 midnights          B Medical/Surgery History Past Medical History:  Diagnosis Date   Breast cancer ([place])    Carpal tunnel syndrome    Hypertension    Past Surgical History:  Procedure Laterality Date   left breast mastectomy (other)     TONSILLECTOMY       A IV Location/Drains/Wounds Patient Lines/Drains/Airways Status     Active Line/Drains/Airways     Name Placement date Placement time Site Days   Peripheral IV 08/10/21 20 G Posterior;Right Forearm 08/10/21  0404  Forearm  less than 1            Intake/Output Last 24 hours  Intake/Output Summary (Last 24 hours) at 08/10/2021 2020 Last data filed at 08/10/2021 1629 Gross per 24 hour  Intake --  Output 2650 ml  Net -2650 ml    Labs/Imaging Results for orders placed or  performed during the hospital encounter of 08/09/21 (from the past 48 hour(s))  Basic metabolic panel     Status: Abnormal   Collection Time: 08/09/21  6:47 PM  Result Value Ref Range   Sodium 131 (L) 135 - 145 mmol/L   Potassium 4.3 3.5 - 5.1 mmol/L   Chloride 101 98 - 111 mmol/L   CO2 24 22 - 32 mmol/L   Glucose, Bld 100 (H) 70 - 99 mg/dL    Comment: Glucose reference range applies only to samples taken after fasting for at least 8 hours.   BUN 15 8 - 23 mg/dL   Creatinine, Ser 0.64 0.44 - 1.00 mg/dL   Calcium 9.2 8.9 - 10.3 mg/dL   GFR, Estimated >60 >60 mL/min    Comment: (NOTE) Calculated using the CKD-EPI Creatinine Equation (2021)    Anion gap 6 5 - 15    Comment: Performed at [facility] [address] [place], [place] [address]  CBC     Status: Abnormal   Collection Time: 08/09/21  6:47 PM  Result Value Ref Range   WBC 3.1 (L) 4.0 - 10.5 K/uL   RBC 3.29 (L) 3.87 - 5.11 MIL/uL   Hemoglobin 8.1 (L) 12.0 - 15.0 g/dL   HCT 26.8 (L) 36.0 - 46.0 %   MCV 81.5 80.0 - 100.0 fL   MCH 24.6 (L) 26.0 - 34.0 pg  MCHC 30.2 30.0 - 36.0 g/dL   RDW 15.2 11.5 - 15.5 %   Platelets 193 150 - 400 K/uL   nRBC 0.0 0.0 - 0.2 %    Comment: Performed at [facility], [place] [address] [place], [place] [address]  Troponin I (High Sensitivity)     Status: None   Collection Time: 08/09/21  6:47 PM  Result Value Ref Range   Troponin I (High Sensitivity) 15 <18 ng/L    Comment: (NOTE) Elevated high sensitivity troponin I (hsTnI) values and significant  changes across serial measurements may suggest ACS but many other  chronic and acute conditions are known to elevate hsTnI results.  Refer to the "Links" section for chest pain algorithms and additional  guidance. Performed at [facility], [place] [address] [place], [place] [address]   Brain natriuretic peptide     Status: Abnormal   Collection Time: 08/09/21  6:47 PM  Result Value Ref Range   B Natriuretic Peptide 159.0  (H) 0.0 - 100.0 pg/mL    Comment: Performed at [facility] [address] [place], [place] [address]  Troponin I (High Sensitivity)     Status: None   Collection Time: 08/09/21 10:31 PM  Result Value Ref Range   Troponin I (High Sensitivity) 14 <18 ng/L    Comment: (NOTE) Elevated high sensitivity troponin I (hsTnI) values and significant  changes across serial measurements may suggest ACS but many other  chronic and acute conditions are known to elevate hsTnI results.  Refer to the "Links" section for chest pain algorithms and additional  guidance. Performed at [facility], [place] [address], [place], [place] [address]   POC occult blood, ED Provider will collect     Status: Abnormal   Collection Time: 08/10/21  3:24 AM  Result Value Ref Range   Fecal Occult Bld POSITIVE (A) NEGATIVE  Resp Panel by RT-PCR (Flu A&B, Covid) Nasopharyngeal Swab     Status: None   Collection Time: 08/10/21  4:09 AM   Specimen: Nasopharyngeal Swab; Nasopharyngeal(NP) swabs in vial transport medium  Result Value Ref Range   SARS Coronavirus 2 by RT PCR NEGATIVE NEGATIVE    Comment: (NOTE) SARS-CoV-2 target nucleic acids are NOT DETECTED.  The SARS-CoV-2 RNA is generally detectable in upper respiratory specimens during the acute phase of infection. The lowest concentration of SARS-CoV-2 viral copies this assay can detect is 138 copies/mL. A negative result does not preclude SARS-Cov-2 infection and should not be used as the sole basis for treatment or other patient management decisions. A negative result may occur with  improper specimen collection/handling, submission of specimen other than nasopharyngeal swab, presence of viral mutation(s) within the areas targeted by this assay, and inadequate number of viral copies(<138 copies/mL). A negative result must be combined with clinical observations, patient history, and epidemiological information. The expected result is Negative.  Fact  Sheet for Patients:  EntrepreneurPulse.com.au  Fact Sheet for Healthcare Providers:  IncredibleEmployment.be  This test is no t yet approved or cleared by the [place] FDA and  has been authorized for detection and/or diagnosis of SARS-CoV-2 by FDA under an Emergency Use Authorization (EUA). This EUA will remain  in effect (meaning this test can be used) for the duration of the COVID-19 declaration under Section 564(b)(1) of the Act, 21 U.S.C.section 360bbb-3(b)(1), unless the authorization is terminated  or revoked sooner.       Influenza A by PCR NEGATIVE NEGATIVE  Influenza B by PCR NEGATIVE NEGATIVE    Comment: (NOTE) The Xpert Xpress SARS-CoV-2/FLU/RSV plus assay is intended as an aid in the diagnosis of influenza from Nasopharyngeal swab specimens and should not be used as a sole basis for treatment. Nasal washings and aspirates are unacceptable for Xpert Xpress SARS-CoV-2/FLU/RSV testing.  Fact Sheet for Patients: EntrepreneurPulse.com.au  Fact Sheet for Healthcare Providers: IncredibleEmployment.be  This test is not yet approved or cleared by the [place] FDA and has been authorized for detection and/or diagnosis of SARS-CoV-2 by FDA under an Emergency Use Authorization (EUA). This EUA will remain in effect (meaning this test can be used) for the duration of the COVID-19 declaration under Section 564(b)(1) of the Act, 21 U.S.C. section 360bbb-3(b)(1), unless the authorization is terminated or revoked.  Performed at [facility], [place] [address], [place], [place] [address]   D-dimer, quantitative     Status: Abnormal   Collection Time: 08/10/21  6:00 AM  Result Value Ref Range   D-Dimer, Quant 1.92 (H) 0.00 - 0.50 ug/mL-FEU    Comment: (NOTE) At the manufacturer cut-off value of 0.5 g/mL FEU, this assay has a negative predictive value of 95-100%.This assay is intended for  use in conjunction with a clinical pretest probability (PTP) assessment model to exclude pulmonary embolism (PE) and deep venous thrombosis (DVT) in outpatients suspected of PE or DVT. Results should be correlated with clinical presentation. Performed at [facility], [place] [address], [place], [place] [address]   Folate     Status: None   Collection Time: 08/10/21 11:46 AM  Result Value Ref Range   Folate 36.9 >5.9 ng/mL    Comment: Performed at [facility] [address], [place], [place] [address]  Reticulocytes     Status: Abnormal   Collection Time: 08/10/21 11:46 AM  Result Value Ref Range   Retic Ct Pct 1.4 0.4 - 3.1 %   RBC. 3.24 (L) 3.87 - 5.11 MIL/uL   Retic Count, Absolute 43.7 19.0 - 186.0 K/uL   Immature Retic Fract 16.8 (H) 2.3 - 15.9 %    Comment: Performed at [facility] [address] [place], [place] [address]  TSH     Status: None   Collection Time: 08/10/21 11:46 AM  Result Value Ref Range   TSH 1.648 0.350 - 4.500 uIU/mL    Comment: Performed by a 3rd Generation assay with a functional sensitivity of <=0.01 uIU/mL. Performed at [facility], [place] [address], [place], [place] [address]   CBC     Status: Abnormal   Collection Time: 08/10/21 11:46 AM  Result Value Ref Range   WBC 2.6 (L) 4.0 - 10.5 K/uL   RBC 3.29 (L) 3.87 - 5.11 MIL/uL   Hemoglobin 8.4 (L) 12.0 - 15.0 g/dL   HCT 26.1 (L) 36.0 - 46.0 %   MCV 79.3 (L) 80.0 - 100.0 fL   MCH 25.5 (L) 26.0 - 34.0 pg   MCHC 32.2 30.0 - 36.0 g/dL   RDW 15.3 11.5 - 15.5 %   Platelets 194 150 - 400 K/uL   nRBC 0.0 0.0 - 0.2 %    Comment: Performed at [facility], [place] [address], [place], [place] [address]   DG Chest 2 View  Result Date: 08/09/2021 CLINICAL DATA:  Reason for exam: sob The pt is c/o some sob bi-lateral leg swelling and hx of chf EXAM: CHEST - 2 VIEW COMPARISON:  None. FINDINGS: Cardiac  silhouette mildly enlarged. No mediastinal or hilar masses or evidence of  adenopathy. Prominent aortic arch. Clear lungs.  No pleural effusion or pneumothorax. Bilateral advanced glenohumeral joint arthropathic changes. IMPRESSION: 1. No acute cardiopulmonary disease. Electronically Signed   By: Lajean Manes M.D.   On: 08/09/2021 19:53    Pending Labs Unresulted Labs (From admission, onward)     Start     Ordered   08/11/21 9326  Basic metabolic panel  Tomorrow morning,   R        08/10/21 1546   08/10/21 7124  Basic metabolic panel  Tomorrow morning,   R        08/10/21 1150   08/10/21 1146  Vitamin B12  (Anemia Panel (PNL))  Once,   R        08/10/21 1145   08/10/21 1146  Iron and TIBC  (Anemia Panel (PNL))  Once,   R        08/10/21 1145   08/10/21 1146  Ferritin  (Anemia Panel (PNL))  Once,   R        08/10/21 1145   08/10/21 1145  Hemoglobin and hematocrit, blood  Now then every 12 hours,   R (with TIMED occurrences)     Comments: Call for hemoglobin 7 or less    08/10/21 1145            Vitals/Pain Today's Vitals   08/10/21 1230 08/10/21 1315 08/10/21 1400 08/10/21 1800  BP: 127/72 (!) 142/80 122/62 136/88  Pulse: 72 86 89 87  Resp: 20 (!) 24 (!) 25 19  Temp:      TempSrc:      SpO2: 95% 98% 98% 97%  Weight:      Height:      PainSc:        Isolation Precautions No active isolations  Medications Medications  ALPRAZolam (XANAX) tablet 0.25 mg (0.25 mg Oral Given 08/10/21 1246)  furosemide (LASIX) injection 40 mg (40 mg Intravenous Given 08/10/21 1245)  enoxaparin (LOVENOX) injection 40 mg (40 mg Subcutaneous Given 08/10/21 1246)  sodium chloride flush (NS) 0.9 % injection 3 mL (3 mLs Intravenous Not Given 08/10/21 1152)  sodium chloride flush (NS) 0.9 % injection 3 mL (has no administration in time range)  0.9 %  sodium chloride infusion (has no administration in time range)  acetaminophen (TYLENOL) tablet 650 mg (has no administration in time range)    Or  acetaminophen (TYLENOL) suppository 650 mg (has no administration in time  range)  senna-docusate (Senokot-S) tablet 1 tablet (has no administration in time range)  ondansetron (ZOFRAN) tablet 4 mg (has no administration in time range)    Or  ondansetron (ZOFRAN) injection 4 mg (has no administration in time range)  aspirin EC tablet 81 mg (81 mg Oral Given 08/10/21 1246)  pantoprazole (PROTONIX) injection 40 mg (40 mg Intravenous Given 08/10/21 1245)  furosemide (LASIX) injection 20 mg (20 mg Intravenous Given 08/10/21 0406)    Mobility walks with device Moderate fall risk   Focused Assessments Cardiac Assessment Handoff:  Cardiac Rhythm: Normal sinus rhythm No results found for: CKTOTAL, CKMB, CKMBINDEX, TROPONINI Lab Results  Component Value Date   DDIMER 1.92 (H) 08/10/2021   Does the Patient currently have chest pain? No    R Recommendations: See Admitting Provider Note  Report given to:   Additional Notes: none

## 2021-08-10 NOTE — H&P (Signed)
History and Physical    Cassandra Wu RSW:[id number] DOB: [birth year] DOA: 08/09/2021  PCP: Cassandra Art, MD   Patient coming from: Home  Chief Complaint: SOB, swelling of legs.   HPI: Cassandra Wu is a 87 y.o. female with medical history significant for HTN, breast cancer s/p bilateral mastectomy who presents for evaluation of SOB that is worse with exertion and swelling of legs. Cassandra Wu reports Cassandra Wu has chronic swelling of legs but it has been worse the past week. Cassandra Wu states Cassandra Wu gets very SOB walking to bathroom at home. Lives in [place], [place] and her son lives here in [place]. Cassandra Wu came to visit him and has been having more trouble getting around due to swelling and SOB.  Cassandra Wu states Cassandra Wu gets occasional intermittent chills but has not had any fever nausea or vomiting.  Cassandra Wu does report having intermittent black tarry stools off and on for the last 1 to 2 weeks.  Denies any recent change in diet and has not had any falls.  Cassandra Wu states that Cassandra Wu has Lasix at home but does not take it very often due to it making to the bathroom so much Cassandra Wu is not able to do anything else.  Denies tobacco alcohol or illicit drug use.  ED Course: Cassandra. [name] has been hemodynamically stable in the emergency room.  Blood pressures are running 150-176/80-100.  Cassandra Wu has significant edema of her legs and was given dose of Lasix.  Cassandra Wu had mildly elevated BNP of 159.  Troponins in the ER were 15 initially and then rechecked troponin was 14.  WBC 3100 hemoglobin 8.1 hematocrit 26.8 platelets 193,000.  Sodium 131 potassium 4.3 chloride 101 bicarb 24 creatinine 0.64 BUN 15 glucose 100.  Hemoccult positive stool in the emergency room.  Chest x-ray showed no pulmonary edema, infiltrate or consolidation.  Hospitalist service was asked to admit for further management  Review of Systems:  General: Denies fever, chills, weight loss, night sweats. Denies dizziness. Denies change in appetite HENT: Denies head  trauma, headache, denies change in hearing, tinnitus. Denies nasal bleeding.  Denies sore throat.  Denies difficulty swallowing Eyes: Denies blurry vision, pain in eye, drainage.  Denies discoloration of eyes. Neck: Denies pain.  Denies swelling.  Denies pain with movement. Cardiovascular: Denies chest pain, palpitations  Denies orthopnea Respiratory: Reports shortness of breath with exertion. Denies cough. Denies wheezing. Denies sputum production Gastrointestinal: Denies abdominal pain, swelling.  Denies nausea, vomiting, diarrhea. Reports melena.  Denies hematemesis. Musculoskeletal: Denies limitation of movement. Denies arthralgias or myalgias. Genitourinary: Denies pelvic pain.  Denies urinary frequency or hesitancy.  Denies dysuria.  Skin: Denies rash.  Denies petechiae, purpura, ecchymosis. Neurological: Denies seizure activity. Denies paresthesia. Denies slurred speech, drooping face. Denies visual change. Psychiatric: Denies depression, anxiety. Denies hallucinations.  Past Medical History:  Diagnosis Date   Breast cancer ([place])    Carpal tunnel syndrome    Hypertension     Past Surgical History:  Procedure Laterality Date   left breast mastectomy (other)     TONSILLECTOMY      Social History  reports that Cassandra Wu has never smoked. Cassandra Wu has never used smokeless tobacco. Cassandra Wu reports that Cassandra Wu does not drink alcohol. No history on file for drug use.  Allergies  Allergen Reactions   Penicillins    Propranolol Hcl     History reviewed. No pertinent family history.   Prior to Admission medications   Medication Sig Start Date End Date Taking? Authorizing Provider  ALPRAZolam Cassandra Wu) 0.25  MG tablet Take 0.25 mg by mouth 2 (two) times daily.      [provider]  ARTIFICIAL TEARS ophthalmic solution 1 drop as needed.      [provider]  Calcium Carbonate-Vitamin D (CALTRATE 600+D) 600-400 MG-UNIT per tablet Take 1 tablet by mouth daily.      [provider]  ibuprofen (ADVIL) 200 MG tablet Take 200 mg by mouth as needed.      [provider]  Multiple Vitamins-Minerals (MULTIVITAL) tablet Take 1 tablet by mouth daily.      [provider]  senna-docusate (SENOKOT S) 8.6-50 MG per tablet Take 2 tablets by mouth daily.      [provider]  valsartan-hydrochlorothiazide (DIOVAN HCT) 320-25 MG tablet Take 1 tablet by mouth daily. 03/12/19   Muthersbaugh, Cassandra Soho, PA-C  gabapentin (NEURONTIN) 300 MG capsule Take 300 mg by mouth 3 (three) times daily.    03/12/19  [provider]    Physical Exam: Vitals:   08/10/21 0045 08/10/21 0130 08/10/21 0200 08/10/21 0330  BP: (!) 160/91 (!) 159/92 (!) 155/84 (!) 154/81  Pulse: 75 77 79 79  Resp: (!) 23 (!) 22 18 20   Temp:      TempSrc:      SpO2: 98% 97% 97% 96%  Weight:      Height:        Constitutional: NAD, calm, comfortable Vitals:   08/10/21 0045 08/10/21 0130 08/10/21 0200 08/10/21 0330  BP: (!) 160/91 (!) 159/92 (!) 155/84 (!) 154/81  Pulse: 75 77 79 79  Resp: (!) 23 (!) 22 18 20   Temp:      TempSrc:      SpO2: 98% 97% 97% 96%  Weight:      Height:       General: WDWN, Alert and oriented x3.  Eyes: EOMI, PERRL, conjunctivae normal. Sclera nonicteric HENT:  [place]/AT, external ears normal.  Nares patent without epistasis.  Mucous membranes are moist. Posterior pharynx clear of any exudate Neck: Soft, normal range of motion, supple, no masses,  Trachea midline Respiratory: clear to auscultation bilaterally, no wheezing, no crackles. Normal respiratory effort. No accessory muscle use.  Cardiovascular: Regular rate and rhythm, faint systolic murmur. No rubs / gallops. Has bilateral +2 lower extremity edema. Trace pedal pulses. Abdomen: Soft, no tenderness, nondistended, no rebound or guarding. Bowel sounds normoactive Musculoskeletal:no cyanosis. No joint deformity upper and lower extremities.  Skin: Warm, dry, intact no rashes, ulcers. No  induration. Dry patches of skin lateral left leg Neurologic: CN 2-12 grossly intact. Normal speech. Sensation intact Psychiatric: Normal judgment and insight.  Normal mood.    Labs on Admission: I have personally reviewed following labs and imaging studies  CBC: Recent Labs  Lab 08/09/21 1847  WBC 3.1*  HGB 8.1*  HCT 26.8*  MCV 81.5  PLT 539    Basic Metabolic Panel: Recent Labs  Lab 08/09/21 1847  NA 131*  K 4.3  CL 101  CO2 24  GLUCOSE 100*  BUN 15  CREATININE 0.64  CALCIUM 9.2    GFR: Estimated Creatinine Clearance: 45.1 mL/min (by C-G formula based on SCr of 0.64 mg/dL).  Liver Function Tests: No results for input(s): AST, ALT, ALKPHOS, BILITOT, PROT, ALBUMIN in the last 168 hours.  Urine analysis:    Component Value Date/Time   LABSPEC 1.020 03/11/2019 1835   PHURINE 6.5 03/11/2019 1835   GLUCOSEU NEGATIVE 03/11/2019 1835   HGBUR NEGATIVE 03/11/2019 [place] 03/11/2019  [place] 03/11/2019 [place] 03/11/2019 1835   UROBILINOGEN 0.2 03/11/2019 1835   NITRITE NEGATIVE 03/11/2019 [place] 03/11/2019 1835    Radiological Exams on Admission: DG Chest 2 View  Result Date: 08/09/2021 CLINICAL DATA:  Reason for exam: sob The pt is c/o some sob bi-lateral leg swelling and hx of chf EXAM: CHEST - 2 VIEW COMPARISON:  None. FINDINGS: Cardiac silhouette mildly enlarged. No mediastinal or hilar masses or evidence of adenopathy. Prominent aortic arch. Clear lungs.  No pleural effusion or pneumothorax. Bilateral advanced glenohumeral joint arthropathic changes. IMPRESSION: 1. No acute cardiopulmonary disease. Electronically Signed   By: Lajean Manes M.D.   On: 08/09/2021 19:53    EKG: Independently reviewed.  EKG shows normal sinus rhythm with no acute ST elevation or depression.  QTc 427  Assessment/Plan Principal Problem:   GI bleed Cassandra Wu is admitted to Medical Telemetry floor.   Gastroenterology consulted and secure chat sent to Dr Fuller Plan.  Check CBC in am.  Pt is hemoccult positive and reports intermittent dark colored stools.  Started on protonix.   Active Problems:   Anemia Hgb of 8.1. Recheck CBC in am. Will transfuse if Hgb drops to 7 or less.     Essential hypertension Continue [place] HCT. Monitor BP.    Dyspnea Check Echocardiogram to evaluate wall motion, EF and valvular function. Pt has a faint murmur.     DEPENDENT EDEMA, LEGS, BILATERAL Diuresis with lasix. Monitor I&Os and daily weight.      Weakness PT evaluation in the morning.  DVT prophylaxis: Lovenox for DVT prophylaxis.   Code Status:   Full code Family Communication:  Diagnosis and plan discussed with patient and her son who is at bedside.  They both verbalized understanding agree with plan.  Questions answered.  Further recommendations to follow as clinical indicated Disposition Plan:   Patient is from:  Home  Anticipated DC to:  Home  Anticipated DC date:  Anticipate 2 midnight or more stay in the hospital   Admission status:  Inpatient  Yevonne Aline Veida Spira MD [facility]  How to contact the [facility] Attending or Consulting provider [place] or covering provider during after hours [place], for this patient?   Check the care team in [facility] and look for a) attending/consulting [facility] provider listed and b) the [facility] team listed Log into [email or web address] and use [place]'s universal password to access. If you do not have the password, please contact the hospital operator. Locate the [facility] provider you are looking for under [facility] and page to a number that you can be directly reached. If you still have difficulty reaching the provider, please page the [facility] (Director on Call) for the Hospitalists listed on amion for assistance.  08/10/2021, 4:37 AM

## 2021-08-10 NOTE — ED Notes (Signed)
Pt adjusted up in bed

## 2021-08-10 NOTE — Hospital Course (Addendum)
87 year old female with past medical history of hypertension, breast cancer status postmastectomy and obesity presented to the emergency room on 12/30 with complaints of shortness of breath and lower extremity swelling as well as black tarry stools for the past 1 to 2 weeks.  In the emergency department, patient's stool found to be heme positive with a hemoglobin of 8.1.  Getting IV iron.  Last hemoglobin 2.5 years ago and at that time at 13.1.  Vital signs stable.  Admitted to the hospitalist service with gastroenterology consulted, who plan to take patient for colonoscopy and EGD on Tuesday or Wednesday.  Echocardiogram pending.  Patient has responded with IV Lasix and has diuresed almost 5 L in the last 24 hours.

## 2021-08-10 NOTE — ED Notes (Signed)
Pt lying semi fowlers in bed, breathing even and unlabored. A/ox4, per pt and family, pt has had swollen legs for "some time" but over the last 2 weeks the swelling has become greater and pt has become dyspnic at rest and exertion. Pt has +4 pitting edema from knees to toes. Speaking in full and complete sentences, denies current SOB or CP. LS clear bilaterally. On purewick, 1038ml urine output.

## 2021-08-10 NOTE — ED Provider Notes (Signed)
[facility] Provider Note   CSN: [id number] Arrival date & time: 08/09/21  1748     History Chief Complaint  Patient presents with   Shortness of Breath    Zapata is a 87 y.o. female.  Patient is a 87 year old female with past medical history of breast cancer and hypertension.  Patient presenting today for evaluation of shortness of breath and leg swelling.  This has worsened over the past several days.  She describes leg swelling to the point that she is having difficulty ambulating and becomes short of breath with little exertion.  She denies fevers, chills, productive cough, or chest pain.  The history is provided by the patient.  Shortness of Breath Severity:  Moderate Onset quality:  Gradual Duration:  3 days Timing:  Constant Progression:  Worsening Chronicity:  New Context: activity   Context: not URI   Relieved by:  Nothing Worsened by:  Exertion and activity Ineffective treatments:  None tried Associated symptoms: no fever       Past Medical History:  Diagnosis Date   Breast cancer ([place])    Carpal tunnel syndrome    Hypertension     Patient Active Problem List   Diagnosis Date Noted   CARPAL TUNNEL SYNDROME, RIGHT 07/16/2010   HYPERTENSION 03/19/2010   TIA 03/19/2010   LYMPHEDEMA, LEFT ARM 03/19/2010   DEPENDENT EDEMA, LEGS, BILATERAL 03/19/2010   OTHER CHEST PAIN 03/19/2010   MASTECTOMY, BILATERAL, HX OF 03/19/2010    Past Surgical History:  Procedure Laterality Date   left breast mastectomy (other)     TONSILLECTOMY       OB History   No obstetric history on file.     No family history on file.  Social History   Tobacco Use   Smoking status: Never   Smokeless tobacco: Never  Substance Use Topics   Alcohol use: No    Home Medications Prior to Admission medications   Medication Sig Start Date End Date Taking? Authorizing Provider  ALPRAZolam (XANAX) 0.25 MG tablet Take 0.25 mg  by mouth 2 (two) times daily.      [provider]  ARTIFICIAL TEARS ophthalmic solution 1 drop as needed.      [provider]  Calcium Carbonate-Vitamin D (CALTRATE 600+D) 600-400 MG-UNIT per tablet Take 1 tablet by mouth daily.      [provider]  ibuprofen (ADVIL) 200 MG tablet Take 200 mg by mouth as needed.      [provider]  Multiple Vitamins-Minerals (MULTIVITAL) tablet Take 1 tablet by mouth daily.      [provider]  senna-docusate (SENOKOT S) 8.6-50 MG per tablet Take 2 tablets by mouth daily.      [provider]  valsartan-hydrochlorothiazide (DIOVAN HCT) 320-25 MG tablet Take 1 tablet by mouth daily. 03/12/19   Muthersbaugh, Jarrett Soho, PA-C  gabapentin (NEURONTIN) 300 MG capsule Take 300 mg by mouth 3 (three) times daily.    03/12/19  [provider]    Allergies    Penicillins and Propranolol hcl  Review of Systems   Review of Systems  Constitutional:  Negative for fever.  Respiratory:  Positive for shortness of breath.   All other systems reviewed and are negative.  Physical Exam Updated Vital Signs BP (!) 155/84    Pulse 79    Temp 98.3 F (36.8 C)    Resp 18    Ht 5\' 4"  (1.626 m)    Wt 83.9 kg  SpO2 97%    BMI 31.75 kg/m   Physical Exam Vitals and nursing note reviewed.  Constitutional:      General: She is not in acute distress.    Appearance: She is well-developed. She is not diaphoretic.  HENT:     Head: Normocephalic and atraumatic.  Cardiovascular:     Rate and Rhythm: Normal rate and regular rhythm.     Heart sounds: No murmur heard.   No friction rub. No gallop.  Pulmonary:     Effort: Pulmonary effort is normal. No respiratory distress.     Breath sounds: Normal breath sounds. No wheezing.  Abdominal:     General: Bowel sounds are normal. There is no distension.     Palpations: Abdomen is soft.     Tenderness: There is no abdominal tenderness.  Genitourinary:    Rectum: Guaiac result  positive.  Musculoskeletal:        General: Normal range of motion.     Cervical back: Normal range of motion and neck supple.  Skin:    General: Skin is warm and dry.  Neurological:     General: No focal deficit present.     Mental Status: She is alert and oriented to person, place, and time.    ED Results / Procedures / Treatments   Labs (all labs ordered are listed, but only abnormal results are displayed) Labs Reviewed  BASIC METABOLIC PANEL - Abnormal; Notable for the following components:      Result Value   Sodium 131 (*)    Glucose, Bld 100 (*)    All other components within normal limits  CBC - Abnormal; Notable for the following components:   WBC 3.1 (*)    RBC 3.29 (*)    Hemoglobin 8.1 (*)    HCT 26.8 (*)    MCH 24.6 (*)    All other components within normal limits  BRAIN NATRIURETIC PEPTIDE - Abnormal; Notable for the following components:   B Natriuretic Peptide 159.0 (*)    All other components within normal limits  POC OCCULT BLOOD, ED  TROPONIN I (HIGH SENSITIVITY)  TROPONIN I (HIGH SENSITIVITY)    EKG None  Radiology DG Chest 2 View  Result Date: 08/09/2021 CLINICAL DATA:  Reason for exam: sob The pt is c/o some sob bi-lateral leg swelling and hx of chf EXAM: CHEST - 2 VIEW COMPARISON:  None. FINDINGS: Cardiac silhouette mildly enlarged. No mediastinal or hilar masses or evidence of adenopathy. Prominent aortic arch. Clear lungs.  No pleural effusion or pneumothorax. Bilateral advanced glenohumeral joint arthropathic changes. IMPRESSION: 1. No acute cardiopulmonary disease. Electronically Signed   By: Lajean Manes M.D.   On: 08/09/2021 19:53    Procedures Procedures   Medications Ordered in ED Medications  furosemide (LASIX) injection 20 mg (has no administration in time range)    ED Course  I have reviewed the triage vital signs and the nursing notes.  Pertinent labs & imaging results that were available during my care of the patient were  reviewed by me and considered in my medical decision making (see chart for details).    MDM Rules/Calculators/A&P  Patient is a 87 year old female with history of breast cancer and hypertension.  She presents today for evaluation of shortness of breath and swelling of her legs.  She has 3+ pitting edema of both legs that has increased over the past several days.  This is making it difficult for her to ambulate.  Chest x-ray does not show  pulmonary edema,but there is a hemoglobin of 8 and heme positive stools.  I suspect that etiology of her dyspnea is multifactorial and related to fluid retention as well as symptomatic anemia.  Patient given IV Lasix in the ER and I feel will require admission for diuresis and trending of hemoglobin.  I have spoken with Dr. Tonie Griffith who agrees to admit.  Final Clinical Impression(s) / ED Diagnoses Final diagnoses:  None    Rx / DC Orders ED Discharge Orders     None        Veryl Speak, MD 08/10/21 [phone]

## 2021-08-10 NOTE — Assessment & Plan Note (Signed)
Blood pressure actually slightly elevated.  Monitoring closely.

## 2021-08-10 NOTE — Assessment & Plan Note (Addendum)
Patient initially had a BMI of slightly above 30 on admission but with diuresis, BMI down to 27.  She Naegele under 25 when she is fully diuresed

## 2021-08-10 NOTE — Progress Notes (Signed)
[facility] Progress Note  Patient: Cassandra Wu    IRC:[id number]  DOA: 08/09/2021    Date of Service: the patient was seen and examined on 08/10/2021  Brief hospital course: 87 year old female with past medical history of hypertension, breast cancer status postmastectomy and obesity presented to the emergency room on 12/30 with complaints of shortness of breath and lower extremity swelling as well as black tarry stools for the past 1 to 2 weeks.  In the emergency department, patient's stool found to be heme positive with a hemoglobin of 8.1.  Last hemoglobin 2.5 years ago and at that time at 13.1.  Vital signs stable.  Admitted to the hospitalist service with gastroenterology consulted.  Assessment and Plan: Cardiovascular and Mediastinum Essential hypertension Assessment & Plan Blood pressure actually slightly elevated.  Monitoring closely.  Digestive * GI bleed Assessment & Plan We will see if we can get some recent labs.  Gastroenterology following.  Hemoccult positive.  Following H&H.  Plans for possible endoscopy/colonoscopy after cardiac assessment and echocardiogram.  Other Obesity (BMI 30-39.9) Assessment & Plan Meets criteria for BMI greater than 30  DEPENDENT EDEMA, LEGS, BILATERAL Assessment & Plan Quite significant.  Awaiting echocardiogram.    Body mass index is 31.75 kg/m.        Consultants: Gastroenterology  Procedures: Echocardiogram pending  Antimicrobials: None  Code Status: Full code   Subjective: Patient doing okay, no complaints.  Denies any pain.  Objective: Vital signs were reviewed and unremarkable. Vitals:   08/10/21 1315 08/10/21 1400  BP: (!) 142/80 122/62  Pulse: 86 89  Resp: (!) 24 (!) 25  Temp:    SpO2: 98% 98%    Intake/Output Summary (Last 24 hours) at 08/10/2021 1528 Last data filed at 08/10/2021 0410 Gross per 24 hour  Intake --  Output 1650 ml  Net -1650 ml   Filed Weights   08/09/21 1839   Weight: 83.9 kg   Body mass index is 31.75 kg/m.  Exam:  General: Alert and oriented x2, no acute distress HEENT: Normocephalic and atraumatic, mucous membranes slightly dry Cardiovascular: Regular rate and rhythm, S1-S2, 2 out of 6 systolic ejection murmur Respiratory: Clear to auscultation bilaterally Abdomen: Soft, nontender, nondistended, positive bowel sounds Musculoskeletal: 3+ pitting edema from the knees down Skin: Minimal erythema, but this looks to be more venous stasis rather than any kind of infection Psychiatry: Appropriate, no evidence of psychoses Neurology: No focal deficits  Data Reviewed: My review of labs, imaging, notes and other tests shows no new significant findings.   Disposition:  Status is: Inpatient  Remains inpatient appropriate because: Further work-up including assessment of cardiac function and ensuring no active bleeding  Family Communication: Daughter at the bedside DVT Prophylaxis: enoxaparin (LOVENOX) injection 40 mg Start: 08/10/21 1200    Author: Annita Brod ,MD 08/10/2021 3:28 PM  To reach On-call, see care teams to locate the attending and reach out via [email or web address]. Between 7PM-7AM, please contact night-coverage If you still have difficulty reaching the attending provider, please page the [facility] (Director on Call) for [facility] on amion for assistance.

## 2021-08-11 ENCOUNTER — Other Ambulatory Visit (HOSPITAL_COMMUNITY): Payer: Medicare HMO

## 2021-08-11 DIAGNOSIS — I10 Essential (primary) hypertension: Secondary | ICD-10-CM

## 2021-08-11 DIAGNOSIS — R195 Other fecal abnormalities: Secondary | ICD-10-CM | POA: Diagnosis not present

## 2021-08-11 DIAGNOSIS — D509 Iron deficiency anemia, unspecified: Secondary | ICD-10-CM

## 2021-08-11 DIAGNOSIS — E663 Overweight: Secondary | ICD-10-CM

## 2021-08-11 LAB — BASIC METABOLIC PANEL
Anion gap: 7 (ref 5–15)
Anion gap: 9 (ref 5–15)
BUN: 11 mg/dL (ref 8–23)
BUN: 14 mg/dL (ref 8–23)
CO2: 23 mmol/L (ref 22–32)
CO2: 26 mmol/L (ref 22–32)
Calcium: 9 mg/dL (ref 8.9–10.3)
Calcium: 9.1 mg/dL (ref 8.9–10.3)
Chloride: 102 mmol/L (ref 98–111)
Chloride: 105 mmol/L (ref 98–111)
Creatinine, Ser: 0.56 mg/dL (ref 0.44–1.00)
Creatinine, Ser: 0.72 mg/dL (ref 0.44–1.00)
GFR, Estimated: >60 mL/min (ref >60)
GFR, Estimated: >60 mL/min (ref >60)
Glucose, Bld: 108 mg/dL — ABNORMAL HIGH (ref 70–99)
Glucose, Bld: 90 mg/dL (ref 70–99)
Potassium: 3.9 mmol/L (ref 3.5–5.1)
Potassium: 4.2 mmol/L (ref 3.5–5.1)
Sodium: 135 mmol/L (ref 135–145)
Sodium: 137 mmol/L (ref 135–145)

## 2021-08-11 LAB — HEMOGLOBIN AND HEMATOCRIT, BLOOD
HCT: 26.8 % — ABNORMAL LOW (ref 36.0–46.0)
Hemoglobin: 8.2 g/dL — ABNORMAL LOW (ref 12.0–15.0)

## 2021-08-11 MED ORDER — MELATONIN 5 MG PO TABS
5.0000 mg | ORAL_TABLET | Freq: Once | ORAL | Status: AC
  Administered 2021-08-11: 5 mg via ORAL
  Filled 2021-08-11: qty 1

## 2021-08-11 NOTE — Progress Notes (Addendum)
[facility] Progress Note  Patient: Cassandra Wu    ZYS:[id number]  DOA: 08/09/2021    Date of Service: the patient was seen and examined on 08/11/2021  Brief hospital course: 88 year old female with past medical history of hypertension, breast cancer status postmastectomy and obesity presented to the emergency room on 12/30 with complaints of shortness of breath and lower extremity swelling as well as black tarry stools for the past 1 to 2 weeks.  In the emergency department, patient's stool found to be heme positive with a hemoglobin of 8.1.  Getting IV iron.  Last hemoglobin 2.5 years ago and at that time at 13.1.  Vital signs stable.  Admitted to the hospitalist service with gastroenterology consulted, who plan to take patient for colonoscopy and EGD on Tuesday or Wednesday.  Echocardiogram pending.  Patient has responded with IV Lasix and has diuresed almost 5 L in the last 24 hours.  Assessment and Plan: Cardiovascular and Mediastinum Essential hypertension Assessment & Plan Blood pressure actually slightly elevated.  Monitoring closely.  Digestive * GI bleed Assessment & Plan We will see if we can get some recent labs.  Gastroenterology following.  Hemoccult positive.  Following H&H.  Plans for possible endoscopy/colonoscopy Tuesday or Wednesday.  Transfuse for hemoglobin below 7.  Getting IV iron.  Other Overweight (BMI 25.0-29.9) Assessment & Plan Patient initially had a BMI of slightly above 30 on admission but with diuresis, BMI down to 27.  She Naegele under 25 when she is fully diuresed  DEPENDENT EDEMA, LEGS, BILATERAL Assessment & Plan Quite significant.  Awaiting echocardiogram.  Has diuresed almost 5 L in the last 24 hours.    Body mass index is 27.02 kg/m.        Consultants: Gastroenterology  Procedures: Echocardiogram pending Planned colonoscopy and EGD 1/2 or 1/3  Antimicrobials: None  Code Status: Full code   Subjective: Patient doing  okay, no complaints.  Some discomfort in her lower extremities from swelling  Objective: Vital signs were reviewed and unremarkable. Vitals:   08/11/21 1527 08/11/21 1950  BP: 130/75 (!) 141/77  Pulse: 94 85  Resp: 19 18  Temp: 97.7 F (36.5 C) 98.6 F (37 C)  SpO2:  96%    Intake/Output Summary (Last 24 hours) at 08/11/2021 2113 Last data filed at 08/11/2021 1810 Gross per 24 hour  Intake 665 ml  Output 2775 ml  Net -2110 ml    Filed Weights   08/09/21 1839 08/11/21 0611  Weight: 83.9 kg 71.4 kg   Body mass index is 27.02 kg/m.  Exam:  General: Alert and oriented x2, no acute distress HEENT: Normocephalic and atraumatic, mucous membranes slightly dry Cardiovascular: Regular rate and rhythm, S1-S2, 2 out of 6 systolic ejection murmur Respiratory: Clear to auscultation bilaterally Abdomen: Soft, nontender, nondistended, positive bowel sounds Musculoskeletal: 3+ pitting edema from the knees down Skin: Mild bilateral erythema, but this looks to be more venous stasis rather than any kind of infection Psychiatry: Appropriate, no evidence of psychoses Neurology: No focal deficits  Data Reviewed: My review of labs, imaging, notes and other tests shows no new significant findings.   Disposition:  Status is: Inpatient  Remains inpatient appropriate because: Further work-up including assessment of cardiac function and ensuring no active bleeding  Family Communication: Left message for daughter. DVT Prophylaxis: Place and maintain sequential compression device Start: 08/11/21 1226    Author: Annita Brod ,MD 08/11/2021 9:13 PM  To reach On-call, see care teams to locate the attending and  reach out via [email or web address]. Between 7PM-7AM, please contact night-coverage If you still have difficulty reaching the attending provider, please page the [facility] (Director on Call) for [facility] on amion for assistance.

## 2021-08-11 NOTE — Plan of Care (Signed)
  Problem: Nutrition: Goal: Adequate nutrition will be maintained Outcome: Progressing   Problem: Pain Managment: Goal: General experience of comfort will improve Outcome: Progressing   

## 2021-08-11 NOTE — Plan of Care (Signed)
°  Problem: Nutrition: Goal: Adequate nutrition will be maintained 08/11/2021 1050 by Mohamed-Medani, Latashia Koch A, RN Outcome: Progressing 08/11/2021 1049 by Mohamed-Medani, Camree Wigington A, RN Outcome: Progressing   Problem: Pain Managment: Goal: General experience of comfort will improve 08/11/2021 1050 by Mohamed-Medani, Revere Maahs A, RN Outcome: Progressing 08/11/2021 1049 by Mohamed-Medani, Marielouise Amey A, RN Outcome: Progressing

## 2021-08-11 NOTE — Evaluation (Signed)
Physical Therapy Evaluation Patient Details Name: Cassandra Wu MRN: [id number] DOB: 01/28/1925 Today's Date: 08/11/2021  History of Present Illness  Cassandra Wu is a 88 y.o. female who presents for evaluation of SOB that is worse with exertion and swelling of legs, also found to have GI bleed. with medical history significant for HTN, breast cancer s/p bilateral mastectomy  Clinical Impression   Pt admitted with above diagnosis. Lives alone on single level of home (with ramped entrance) in [place]; Was visiting family in [place] area when admitted; Typically uses a Rollator RW for amb; [place] helps with ADLs and IADls in moring hours daily except Sundays; Manages modified independently the rest of the day; Presents to PT with significant generalized weakness and fatigue, effecting her independence and safety with mobility and ADLs;  Pt currently with functional limitations due to the deficits listed below (see PT Problem List). Pt will benefit from skilled PT to increase their independence and safety with mobility to allow discharge to the venue listed below.          Recommendations for follow up therapy are one component of a multi-disciplinary discharge planning process, led by the attending physician.  Recommendations may be updated based on patient status, additional functional criteria and insurance authorization.  Follow Up Recommendations Skilled nursing-short term rehab (<3 hours/day)    Assistance Recommended at Discharge Set up Supervision/Assistance  Functional Status Assessment Patient has had a recent decline in their functional status and demonstrates the ability to make significant improvements in function in a reasonable and predictable amount of time.  Equipment Recommendations  None recommended by PT (Pretty well-equipped; will look to OT recommendations for possible adaptive equipment)    Recommendations for Other Services OT consult  (will order per protocol)     Precautions / Restrictions Precautions Precautions: Fall      Mobility  Bed Mobility Overal bed mobility: Needs Assistance Bed Mobility: Supine to Sit     Supine to sit: Min assist;Mod assist     General bed mobility comments: initiates well by moving LEs to EOB; pushes up to sit through elbow prop; Difficulty with reciprocally scooting to EOB, required heavy mod assist and use of bed pad to get Feet to floor and to square off hips at EOB    Transfers Overall transfer level: Needs assistance Equipment used: Rolling walker (2 wheels) Transfers: Sit to/from Stand;Bed to chair/wheelchair/BSC Sit to Stand: Mod assist   Step pivot transfers: Mod assist       General transfer comment: Heavy mod assist to power up to sit and shift wiehgt forward; heavy posterior lean; heavy mod assist to keep balance and upright while taking pivot steps to chair due to posterior bias    Ambulation/Gait                  Stairs            Wheelchair Mobility    Modified Rankin (Stroke Patients Only)       Balance Overall balance assessment: Needs assistance Sitting-balance support: Feet supported;Bilateral upper extremity supported Sitting balance-Leahy Scale: Fair       Standing balance-Leahy Scale: Zero Standing balance comment: Required constant moderate physical assist to stay upright due to posterior lean                             Pertinent Vitals/Pain Pain Assessment: Faces Faces Pain Scale: Hurts little more Pain Location:  bilateral shoulders; pt also reports generalized itching, which is quite bothersome Pain Descriptors / Indicators: Aching (Itching) Pain Intervention(s): Monitored during session;Repositioned    Home Living Family/patient expects to be discharged to:: Skilled nursing facility Living Arrangements: Alone Available Help at Discharge: Personal care attendant (a few hours in the am, daily except  Sundays) Type of Home: House Home Access: Ramped entrance       Home Layout: One level;Laundry or work area in [place]: Conservation officer, nature (2 wheels);Rollator (4 wheels);Tub bench Additional Comments: Enjoys going out, but doesn't do that much; no longer drives    Prior Function Prior Level of Function : Independent/Modified Independent;Needs assist       Physical Assist : ADLs (physical)   ADLs (physical): IADLs Mobility Comments: Typically uses a Rollator RW; Reports no difficulty with transfers or getting in and out of bed normally ADLs Comments: Aide assists; Reports recent difficulties with shoulder pain and hand movements     Hand Dominance   Dominant Hand: Right    Extremity/Trunk Assessment   Upper Extremity Assessment Upper Extremity Assessment: Generalized weakness (Bil shoulders with aching pain)    Lower Extremity Assessment Lower Extremity Assessment: Generalized weakness (Notable edema bilaterally)       Communication   Communication: No difficulties  Cognition Arousal/Alertness: Awake/alert Behavior During Therapy: WFL for tasks assessed/performed Overall Cognitive Status: Within Functional Limits for tasks assessed                                          General Comments General comments (skin integrity, edema, etc.): Bil LE edema, removed socks due to noted restriction at cuffs; got orthostatic BPs which did not demonstrate a drop (See vitals flow sheet.)    Exercises     Assessment/Plan    PT Assessment Patient needs continued PT services  PT Problem List Decreased strength;Decreased range of motion;Decreased activity tolerance;Decreased balance;Decreased mobility;Decreased coordination;Decreased knowledge of precautions;Cardiopulmonary status limiting activity;Obesity;Pain       PT Treatment Interventions DME instruction;Gait training;Functional mobility training;Therapeutic activities;Therapeutic  exercise;Balance training;Neuromuscular re-education;Patient/family education;Manual techniques;Modalities    PT Goals (Current goals can be found in the Care Plan section)  Acute Rehab PT Goals Patient Stated Goal: Be able to walk PT Goal Formulation: With patient Time For Goal Achievement: 08/25/21 Potential to Achieve Goals: Good    Frequency Min 2X/week   Barriers to discharge Decreased caregiver support Must be modified independent to return to her home; must manage without assist for the greater part of the day    Co-evaluation               AM-PAC PT "6 Clicks" Mobility  Outcome Measure Help needed turning from your back to your side while in a flat bed without using bedrails?: A Little Help needed moving from lying on your back to sitting on the side of a flat bed without using bedrails?: A Lot Help needed moving to and from a bed to a chair (including a wheelchair)?: A Lot Help needed standing up from a chair using your arms (e.g., wheelchair or bedside chair)?: A Lot Help needed to walk in hospital room?: A Lot Help needed climbing 3-5 steps with a railing? : Total 6 Click Score: 12    End of Session Equipment Utilized During Treatment: Gait belt Activity Tolerance: Patient limited by fatigue Patient left: in chair;with call bell/phone within reach;with chair alarm  set Nurse Communication: Mobility status;Other (comment) (Needs batteries for chair alarm, and consider stedy) PT Visit Diagnosis: Unsteadiness on feet (R26.81);Other abnormalities of gait and mobility (R26.89);Muscle weakness (generalized) (M62.81);History of falling (Z91.81)    Time: 8473-0856 PT Time Calculation (min) (ACUTE ONLY): 43 min   Charges:   PT Evaluation $PT Eval Moderate Complexity: 1 Mod PT Treatments $Therapeutic Activity: 23-37 mins        Roney Marion, PT  Acute Rehabilitation Services Pager [phone] Office [phone]   [place] 08/11/2021, 10:17 AM

## 2021-08-11 NOTE — Progress Notes (Signed)
° ° °  Progress Note   Subjective  Had one small bowel movement. No GI complaints.    Objective  Vital signs in last 24 hours: Temp:  [98.1 F (36.7 C)-99.1 F (37.3 C)] 99.1 F (37.3 C) (01/01 0756) Pulse Rate:  [72-92] 92 (01/01 0756) Resp:  [17-25] 17 (01/01 0756) BP: (122-151)/(62-91) 151/85 (01/01 0756) SpO2:  [95 %-99 %] 97 % (01/01 0756) Weight:  [71.4 kg] 71.4 kg (01/01 0611) Last BM Date: 08/10/21  General: Alert, well-developed, in NAD Heart:  Regular rate and rhythm; no murmurs Chest: Clear to ascultation bilaterally Abdomen:  Soft, nontender and nondistended. Normal bowel sounds, without guarding, and without rebound.   Extremities:  Without edema. Neurologic:  Alert and  oriented x4; grossly normal neurologically. Psych:  Alert and cooperative. Normal mood and affect.  Intake/Output from previous day: 12/31 0701 - 01/01 0700 In: -  Out: 2375 [Urine:2375] Intake/Output this shift: Total I/O In: 125 [P.O.:125] Out: 100 [Urine:100]  Lab Results: Recent Labs    08/09/21 1847 08/10/21 1146 08/10/21 2300  WBC 3.1* 2.6*  --   HGB 8.1* 8.4* 7.5*  HCT 26.8* 26.1* 23.9*  PLT 193 194  --    BMET Recent Labs    08/09/21 1748 08/09/21 1847 08/11/21 0020  NA 137 131* 135  K 3.9 4.3 4.2  CL 105 101 102  CO2 23 24 26   GLUCOSE 90 100* 108*  BUN 11 15 14   CREATININE 0.56 0.64 0.72  CALCIUM 9.1 9.2 9.0    Studies/Results: DG Chest 2 View  Result Date: 08/09/2021 CLINICAL DATA:  Reason for exam: sob The pt is c/o some sob bi-lateral leg swelling and hx of chf EXAM: CHEST - 2 VIEW COMPARISON:  None. FINDINGS: Cardiac silhouette mildly enlarged. No mediastinal or hilar masses or evidence of adenopathy. Prominent aortic arch. Clear lungs.  No pleural effusion or pneumothorax. Bilateral advanced glenohumeral joint arthropathic changes. IMPRESSION: 1. No acute cardiopulmonary disease. Electronically Signed   By: Lajean Manes M.D.   On: 08/09/2021 19:53       Assessment & Recommendations   Iron deficiency anemia, heme positive dark stools. History of daily ibuprofen and occasional reflux symptoms. She takes Pepto Bismol prn. Hgb has decreased to 7.5. Proceed with IV iron infusion. Schedule EGD for Monday or Tuesday. Will try to schedule on Monday however anesthesia is only scheduling emergent add on procedures on Monday so we may have to schedule on Tuesday. Continue pantoprazole 40 mg IV qd. Stop NSAIDs and Pepto Bismol.     LOS: 1 day   Keitra Carusone T. Fuller Plan MD 08/11/2021, 11:30 AM See Shea Evans, [facility], to contact our on call provider

## 2021-08-12 ENCOUNTER — Inpatient Hospital Stay (HOSPITAL_COMMUNITY): Payer: Medicare HMO

## 2021-08-12 DIAGNOSIS — R195 Other fecal abnormalities: Secondary | ICD-10-CM | POA: Diagnosis not present

## 2021-08-12 DIAGNOSIS — R0609 Other forms of dyspnea: Secondary | ICD-10-CM

## 2021-08-12 DIAGNOSIS — D509 Iron deficiency anemia, unspecified: Secondary | ICD-10-CM | POA: Diagnosis not present

## 2021-08-12 DIAGNOSIS — I5033 Acute on chronic diastolic (congestive) heart failure: Secondary | ICD-10-CM

## 2021-08-12 LAB — ECHOCARDIOGRAM COMPLETE
AR max vel: 2.77 cm2
AV Area VTI: 2.81 cm2
AV Area mean vel: 3.1 cm2
AV Mean grad: 4.1 mmHg
AV Peak grad: 9.7 mmHg
Ao pk vel: 1.56 m/s
Area-P 1/2: 3.91 cm2
Height: 64 in
S' Lateral: 2.5 cm
Weight: 2493.84 oz

## 2021-08-12 LAB — IRON AND TIBC
Iron: 14 ug/dL — ABNORMAL LOW (ref 28–170)
Saturation Ratios: 4 % — ABNORMAL LOW (ref 10.4–31.8)
TIBC: 315 ug/dL (ref 250–450)
UIBC: 301 ug/dL

## 2021-08-12 LAB — FERRITIN: Ferritin: 14 ng/mL (ref 11–307)

## 2021-08-12 LAB — BASIC METABOLIC PANEL
Anion gap: 8 (ref 5–15)
BUN: 16 mg/dL (ref 8–23)
CO2: 28 mmol/L (ref 22–32)
Calcium: 9 mg/dL (ref 8.9–10.3)
Chloride: 96 mmol/L — ABNORMAL LOW (ref 98–111)
Creatinine, Ser: 0.66 mg/dL (ref 0.44–1.00)
GFR, Estimated: >60 mL/min (ref >60)
Glucose, Bld: 127 mg/dL — ABNORMAL HIGH (ref 70–99)
Potassium: 3.5 mmol/L (ref 3.5–5.1)
Sodium: 132 mmol/L — ABNORMAL LOW (ref 135–145)

## 2021-08-12 LAB — VITAMIN B12: Vitamin B-12: 473 pg/mL (ref 180–914)

## 2021-08-12 LAB — HEMOGLOBIN AND HEMATOCRIT, BLOOD
HCT: 25 % — ABNORMAL LOW (ref 36.0–46.0)
HCT: 26.6 % — ABNORMAL LOW (ref 36.0–46.0)
Hemoglobin: 8.3 g/dL — ABNORMAL LOW (ref 12.0–15.0)
Hemoglobin: 8.5 g/dL — ABNORMAL LOW (ref 12.0–15.0)

## 2021-08-12 MED ORDER — FUROSEMIDE 40 MG PO TABS
40.0000 mg | ORAL_TABLET | Freq: Every day | ORAL | Status: DC
  Administered 2021-08-12 – 2021-08-13 (×2): 40 mg via ORAL
  Filled 2021-08-12 (×2): qty 1

## 2021-08-12 MED ORDER — GUAIFENESIN-DM 100-10 MG/5ML PO SYRP
5.0000 mL | ORAL_SOLUTION | ORAL | Status: DC | PRN
  Administered 2021-08-12 – 2021-08-15 (×4): 5 mL via ORAL
  Filled 2021-08-12 (×4): qty 5

## 2021-08-12 NOTE — Plan of Care (Signed)
  Problem: Nutrition: Goal: Adequate nutrition will be maintained Outcome: Progressing   Problem: Pain Managment: Goal: General experience of comfort will improve Outcome: Progressing   Problem: Safety: Goal: Ability to remain free from injury will improve Outcome: Progressing   

## 2021-08-12 NOTE — H&P (View-Only) (Signed)
Patient ID: Cassandra Wu, female   DOB: [birth year], 88 y.o.   MRN: [id number]    Progress Note   Subjective  Day # 3 CC: iron deficiency anemia, heme positive dark stool  Hemoglobin 8.3 this a.m.-8.1 on admission Received IV iron Nonspecific complaints, currently undergoing 2D echo.    Objective   Vital signs in last 24 hours: Temp:  [97.7 F (36.5 C)-99.2 F (37.3 C)] 98.6 F (37 C) (01/02 0806) Pulse Rate:  [80-94] 84 (01/02 0806) Resp:  [18-19] 19 (01/02 0806) BP: (130-141)/(75-80) 140/75 (01/02 0806) SpO2:  [96 %-97 %] 97 % (01/02 0806) Weight:  [70.7 kg] 70.7 kg (01/02 0500) Last BM Date: 08/10/21 General: Elderly white female in NAD Heart:  Regular rate and rhythm; no murmurs Lungs: Respirations even and unlabored, lungs CTA bilaterally Abdomen:  Soft, nontender and nondistended. Normal bowel sounds. Extremities:  Without edema. Neurologic:  Alert and oriented,  grossly normal neurologically. Psych:  Cooperative. Normal mood and affect.  Intake/Output from previous day: 01/01 0701 - 01/02 0700 In: 665 [P.O.:665] Out: 2600 [Urine:2600] Intake/Output this shift: No intake/output data recorded.  Lab Results: Recent Labs    08/09/21 1847 08/10/21 1146 08/10/21 2300 08/11/21 1146 08/12/21 0006  WBC 3.1* 2.6*  --   --   --   HGB 8.1* 8.4* 7.5* 8.2* 8.3*  HCT 26.8* 26.1* 23.9* 26.8* 25.0*  PLT 193 194  --   --   --    BMET Recent Labs    08/09/21 1847 08/11/21 0020 08/12/21 0006  NA 131* 135 132*  K 4.3 4.2 3.5  CL 101 102 96*  CO2 24 26 28   GLUCOSE 100* 108* 127*  BUN 15 14 16   CREATININE 0.64 0.72 0.66  CALCIUM 9.2 9.0 9.0      Assessment / Plan:    #63 88 year old white female with iron deficiency anemia, heme positive with reports of dark stools and daily NSAID use Hemoglobin has been stable, no transfusion requirement since admission Received IV iron yesterday  #2 lower extremity edema-has diuresed over 5 L 2D echo in progress  today  #3 history of breast cancer status postmastectomy #4 remote history of bowel obstruction with resection #5 hypertension  Plan:  Regular diet today, n.p.o. after midnight Continue to trend hemoglobin Continue IV Protonix once daily Patient has been scheduled for upper endoscopy with Dr. Henrene Pastor tomorrow 08/13/2021.  Procedure was discussed in detail with the patient including indications risk and benefits and she is agreeable to proceed  Follow-up 2D echo Add tTG and IgA    LOS: 2 days   Amy EsterwoodPA-C  08/12/2021, 8:48 AM    Attending Physician Note   I have taken an interval history, reviewed the chart and examined the patient. I personally saw the patient and performed more than 50% of this encounter in conjunction with the APP. I agree with the APP's note, impression and recommendations. My additional impressions and recommendations are as follows.   *IDA, dark heme + stools, NSAID usage for EGD Tuesday with Dr. Henrene Pastor. Check tTG, IgA. Pantoprazole 40 mg qd.  *Bilateral lower extremity edema, duiresed almost 5 L in past 24 hours. Echo pending  Lucio Edward, MD [facility] See Shea Evans, [facility], for our on call provider

## 2021-08-12 NOTE — Progress Notes (Signed)
PROGRESS NOTE    Cassandra Wu  QVZ:[id number] DOB: 1925-05-30 DOA: 08/09/2021 PCP: Joseph Art, MD    Brief Narrative:   88 year old female with past medical history of hypertension, breast cancer status postmastectomy and obesity presented to the emergency room on 12/30 with complaints of shortness of breath and lower extremity swelling as well as black tarry stools for the past 1 to 2 weeks.  In the emergency department, patient's stool found to be heme positive with a hemoglobin of 8.1.  Getting IV iron.  Admitted to the hospitalist service with gastroenterology consulted, who plan to take patient for colonoscopy and EGD on Tuesday or Wednesday.  Echocardiogram showed ejection fraction 60 to 32% with diastolic dysfunction..  Patient has responded with IV Lasix and has diuresed almost 5 L in the last 24 hours.  Assessment & Plan:   Principal Problem:   GI bleed Active Problems:   Essential hypertension   DEPENDENT EDEMA, LEGS, BILATERAL   Anemia   Dyspnea   Overweight (BMI 25.0-29.9)  Acute on chronic diastolic congestive heart failure.  Ruled in Essential hypertension. Patient was receiving IV Lasix 40 mg twice a day for the last 2 days.  Leg edema essentially resolved.  Patient no longer has any short of breath She has mild elevation of BNP, echocardiogram showed ejection fraction 60 to 95% with diastolic dysfunction.  She probably had acute on chronic diastolic congestive heart failure at time admission, condition had improved, will discontinue IV Lasix at this point.  GI bleed. Acute blood loss anemia  Iron deficient anemia Patient had received IV iron, B12 level normal.     DVT prophylaxis: SCDs Code Status: full Family Communication:  Disposition Plan:    Status is: Inpatient  Remains inpatient appropriate because: Severity of disease, pending inpatient procedure    I/O last 3 completed shifts: In: 665 [P.O.:665] Out: 3975 [Urine:3975] No  intake/output data recorded.     Consultants:  GI.  Procedures: Pending  Antimicrobials: None Subjective: Patient doing well today, no segment short of breath or cough. Has not had a bowel movement since admission to the hospital, no nausea vomiting or abdominal pain. No dysuria hematuria.  Objective: Vitals:   08/11/21 1950 08/12/21 0500 08/12/21 0515 08/12/21 0806  BP: (!) 141/77  138/80 140/75  Pulse: 85  80 84  Resp: 18  18 19   Temp: 98.6 F (37 C)  99.2 F (37.3 C) 98.6 F (37 C)  TempSrc: Oral  Oral Oral  SpO2: 96%  96% 97%  Weight:  70.7 kg    Height:        Intake/Output Summary (Last 24 hours) at 08/12/2021 1202 Last data filed at 08/12/2021 0517 Gross per 24 hour  Intake 540 ml  Output 2500 ml  Net -1960 ml   Filed Weights   08/09/21 1839 08/11/21 0611 08/12/21 0500  Weight: 83.9 kg 71.4 kg 70.7 kg    Examination:  General exam: Appears calm and comfortable  Respiratory system: Clear to auscultation. Respiratory effort normal. Cardiovascular system: S1 & S2 heard, RRR. No JVD, murmurs, rubs, gallops or clicks. No pedal edema. Gastrointestinal system: Abdomen is nondistended, soft and nontender. No organomegaly or masses felt. Normal bowel sounds heard. Central nervous system: Alert and oriented x2. No focal neurological deficits. Extremities: Symmetric 5 x 5 power. Skin: No rashes, lesions or ulcers Psychiatry: Judgement and insight appear normal. Mood & affect appropriate.     Data Reviewed: I have personally reviewed following labs and imaging  studies  CBC: Recent Labs  Lab 08/09/21 1847 08/10/21 1146 08/10/21 2300 08/11/21 1146 08/12/21 0006  WBC 3.1* 2.6*  --   --   --   HGB 8.1* 8.4* 7.5* 8.2* 8.3*  HCT 26.8* 26.1* 23.9* 26.8* 25.0*  MCV 81.5 79.3*  --   --   --   PLT 193 194  --   --   --    Basic Metabolic Panel: Recent Labs  Lab 08/09/21 1748 08/09/21 1847 08/11/21 0020 08/12/21 0006  NA 137 131* 135 132*  K 3.9 4.3 4.2 3.5   CL 105 101 102 96*  CO2 23 24 26 28   GLUCOSE 90 100* 108* 127*  BUN 11 15 14 16   CREATININE 0.56 0.64 0.72 0.66  CALCIUM 9.1 9.2 9.0 9.0   GFR: Estimated Creatinine Clearance: 41.5 mL/min (by C-G formula based on SCr of 0.66 mg/dL). Liver Function Tests: No results for input(s): AST, ALT, ALKPHOS, BILITOT, PROT, ALBUMIN in the last 168 hours. No results for input(s): LIPASE, AMYLASE in the last 168 hours. No results for input(s): AMMONIA in the last 168 hours. Coagulation Profile: No results for input(s): INR, PROTIME in the last 168 hours. Cardiac Enzymes: No results for input(s): CKTOTAL, CKMB, CKMBINDEX, TROPONINI in the last 168 hours. BNP (last 3 results) No results for input(s): PROBNP in the last 8760 hours. HbA1C: No results for input(s): HGBA1C in the last 72 hours. CBG: No results for input(s): GLUCAP in the last 168 hours. Lipid Profile: No results for input(s): CHOL, HDL, LDLCALC, TRIG, CHOLHDL, LDLDIRECT in the last 72 hours. Thyroid Function Tests: Recent Labs    08/10/21 1146  TSH 1.648   Anemia Panel: Recent Labs    08/09/21 1748 08/10/21 1146  VITAMINB12 608  --   FOLATE  --  36.9  FERRITIN 10*  --   TIBC 329  --   IRON 25*  --   RETICCTPCT  --  1.4   Sepsis Labs: No results for input(s): PROCALCITON, LATICACIDVEN in the last 168 hours.  Recent Results (from the past 240 hour(s))  Resp Panel by RT-PCR (Flu A&B, Covid) Nasopharyngeal Swab     Status: None   Collection Time: 08/10/21  4:09 AM   Specimen: Nasopharyngeal Swab; Nasopharyngeal(NP) swabs in vial transport medium  Result Value Ref Range Status   SARS Coronavirus 2 by RT PCR NEGATIVE NEGATIVE Final    Comment: (NOTE) SARS-CoV-2 target nucleic acids are NOT DETECTED.  The SARS-CoV-2 RNA is generally detectable in upper respiratory specimens during the acute phase of infection. The lowest concentration of SARS-CoV-2 viral copies this assay can detect is 138 copies/mL. A negative  result does not preclude SARS-Cov-2 infection and should not be used as the sole basis for treatment or other patient management decisions. A negative result may occur with  improper specimen collection/handling, submission of specimen other than nasopharyngeal swab, presence of viral mutation(s) within the areas targeted by this assay, and inadequate number of viral copies(<138 copies/mL). A negative result must be combined with clinical observations, patient history, and epidemiological information. The expected result is Negative.  Fact Sheet for Patients:  EntrepreneurPulse.com.au  Fact Sheet for Healthcare Providers:  IncredibleEmployment.be  This test is no t yet approved or cleared by the [place] FDA and  has been authorized for detection and/or diagnosis of SARS-CoV-2 by FDA under an Emergency Use Authorization (EUA). This EUA will remain  in effect (meaning this test can be used) for the duration of the COVID-19  declaration under Section 564(b)(1) of the Act, 21 U.S.C.section 360bbb-3(b)(1), unless the authorization is terminated  or revoked sooner.       Influenza A by PCR NEGATIVE NEGATIVE Final   Influenza B by PCR NEGATIVE NEGATIVE Final    Comment: (NOTE) The Xpert Xpress SARS-CoV-2/FLU/RSV plus assay is intended as an aid in the diagnosis of influenza from Nasopharyngeal swab specimens and should not be used as a sole basis for treatment. Nasal washings and aspirates are unacceptable for Xpert Xpress SARS-CoV-2/FLU/RSV testing.  Fact Sheet for Patients: EntrepreneurPulse.com.au  Fact Sheet for Healthcare Providers: IncredibleEmployment.be  This test is not yet approved or cleared by the [place] FDA and has been authorized for detection and/or diagnosis of SARS-CoV-2 by FDA under an Emergency Use Authorization (EUA). This EUA will remain in effect (meaning this test can be used)  for the duration of the COVID-19 declaration under Section 564(b)(1) of the Act, 21 U.S.C. section 360bbb-3(b)(1), unless the authorization is terminated or revoked.  Performed at [facility], [place] [address] [place], [place] [address]          Radiology Studies: ECHOCARDIOGRAM COMPLETE  Result Date: 08/12/2021    ECHOCARDIOGRAM REPORT   Patient Name:   DEMYAH SMYRE Wachtel Date of Exam: 08/12/2021 Medical Rec #:  [id number]             Height:       64.0 in Accession #:    [id number]            Weight:       155.9 lb Date of Birth:  1925-05-31            BSA:          1.760 m Patient Age:    58 years              BP:           138/80 mmHg Patient Gender: F                     HR:           91 bpm. Exam Location:  Inpatient Procedure: 2D Echo, Cardiac Doppler and Color Doppler Indications:    Dyspnea  History:        Patient has no prior history of Echocardiogram examinations.                 Risk Factors:Hypertension.  Sonographer:    Bernadene Person RDCS Referring Phys: [id number] [place]  1. Left ventricular ejection fraction, by estimation, is 60 to 65%. The left ventricle has normal function. The left ventricle has no regional wall motion abnormalities. There is mild left ventricular hypertrophy. Left ventricular diastolic parameters are consistent with Grade I diastolic dysfunction (impaired relaxation). Elevated left atrial pressure.  2. Right ventricular systolic function is normal. The right ventricular size is normal. There is normal pulmonary artery systolic pressure.  3. Left atrial size was mildly dilated.  4. The mitral valve is normal in structure. No evidence of mitral valve regurgitation. No evidence of mitral stenosis.  5. The aortic valve is tricuspid. There is mild calcification of the aortic valve. There is mild thickening of the aortic valve. Aortic valve regurgitation is not visualized. No aortic stenosis is present.  6. The inferior vena cava is normal  in size with <50% respiratory variability, suggesting right atrial pressure of 8 mmHg. FINDINGS  Left Ventricle: Left ventricular ejection fraction, by estimation,  is 60 to 65%. The left ventricle has normal function. The left ventricle has no regional wall motion abnormalities. The left ventricular internal cavity size was normal in size. There is  mild left ventricular hypertrophy. Left ventricular diastolic parameters are consistent with Grade I diastolic dysfunction (impaired relaxation). Elevated left atrial pressure. Right Ventricle: The right ventricular size is normal. No increase in right ventricular wall thickness. Right ventricular systolic function is normal. There is normal pulmonary artery systolic pressure. The tricuspid regurgitant velocity is 2.27 m/s, and  with an assumed right atrial pressure of 8 mmHg, the estimated right ventricular systolic pressure is 61.9 mmHg. Left Atrium: Left atrial size was mildly dilated. Right Atrium: Right atrial size was normal in size. Pericardium: There is no evidence of pericardial effusion. Mitral Valve: The mitral valve is normal in structure. No evidence of mitral valve regurgitation. No evidence of mitral valve stenosis. Tricuspid Valve: The tricuspid valve is normal in structure. Tricuspid valve regurgitation is mild . No evidence of tricuspid stenosis. Aortic Valve: The aortic valve is tricuspid. There is mild calcification of the aortic valve. There is mild thickening of the aortic valve. There is mild aortic valve annular calcification. Aortic valve regurgitation is not visualized. No aortic stenosis  is present. Aortic valve mean gradient measures 4.1 mmHg. Aortic valve peak gradient measures 9.7 mmHg. Aortic valve area, by VTI measures 2.81 cm. Pulmonic Valve: The pulmonic valve was not well visualized. Pulmonic valve regurgitation is not visualized. No evidence of pulmonic stenosis. Aorta: The aortic root is normal in size and structure. Venous: The  inferior vena cava is normal in size with less than 50% respiratory variability, suggesting right atrial pressure of 8 mmHg. IAS/Shunts: The interatrial septum was not well visualized.  LEFT VENTRICLE PLAX 2D LVIDd:         3.60 cm   Diastology LVIDs:         2.50 cm   LV e' medial:    4.68 cm/s LV PW:         1.10 cm   LV E/e' medial:  18.6 LV IVS:        1.10 cm   LV e' lateral:   7.01 cm/s LVOT diam:     2.00 cm   LV E/e' lateral: 12.4 LV SV:         75 LV SV Index:   43 LVOT Area:     3.14 cm  RIGHT VENTRICLE RV S prime:     11.70 cm/s TAPSE (M-mode): 1.9 cm LEFT ATRIUM             Index        RIGHT ATRIUM           Index LA diam:        3.70 cm 2.10 cm/m   RA Area:     12.60 cm LA Vol (A2C):   53.5 ml 30.40 ml/m  RA Volume:   27.40 ml  15.57 ml/m LA Vol (A4C):   60.3 ml 34.27 ml/m LA Biplane Vol: 59.2 ml 33.64 ml/m  AORTIC VALVE AV Area (Vmax):    2.77 cm AV Area (Vmean):   3.10 cm AV Area (VTI):     2.81 cm AV Vmax:           155.66 cm/s AV Vmean:          91.335 cm/s AV VTI:            0.267 m AV Peak Grad:  9.7 mmHg AV Mean Grad:      4.1 mmHg LVOT Vmax:         137.00 cm/s LVOT Vmean:        90.000 cm/s LVOT VTI:          0.239 m LVOT/AV VTI ratio: 0.90  AORTA Ao Root diam: 3.40 cm Ao Asc diam:  3.30 cm MITRAL VALVE                TRICUSPID VALVE MV Area (PHT): 3.91 cm     TR Peak grad:   20.6 mmHg MV Decel Time: 194 msec     TR Vmax:        227.00 cm/s MV E velocity: 86.90 cm/s MV A velocity: 124.00 cm/s  SHUNTS MV E/A ratio:  0.70         Systemic VTI:  0.24 m                             Systemic Diam: 2.00 cm Carlyle Dolly MD Electronically signed by Carlyle Dolly MD Signature Date/Time: 08/12/2021/11:05:39 AM    Final         Scheduled Meds:  ALPRAZolam  0.25 mg Oral BID   aspirin EC  81 mg Oral Daily   furosemide  40 mg Intravenous Q12H   pantoprazole (PROTONIX) IV  40 mg Intravenous Q24H   sodium chloride flush  3 mL Intravenous Q12H   Continuous Infusions:  sodium  chloride       LOS: 2 days    Time spent: 24 minutes    Sharen Hones, MD [facility]   To contact the attending provider between 7A-7P or the covering provider during after hours 7P-7A, please log into the web site [email or web address] and access using universal [place] password for that web site. If you do not have the password, please call the hospital operator.  08/12/2021, 12:02 PM

## 2021-08-12 NOTE — Progress Notes (Addendum)
Patient ID: Cassandra Wu, female   DOB: February 02, 1925, 88 y.o.   MRN: [id number]    Progress Note   Subjective  Day # 3 CC: iron deficiency anemia, heme positive dark stool  Hemoglobin 8.3 this a.m.-8.1 on admission Received IV iron Nonspecific complaints, currently undergoing 2D echo.    Objective   Vital signs in last 24 hours: Temp:  [97.7 F (36.5 C)-99.2 F (37.3 C)] 98.6 F (37 C) (01/02 0806) Pulse Rate:  [80-94] 84 (01/02 0806) Resp:  [18-19] 19 (01/02 0806) BP: (130-141)/(75-80) 140/75 (01/02 0806) SpO2:  [96 %-97 %] 97 % (01/02 0806) Weight:  [70.7 kg] 70.7 kg (01/02 0500) Last BM Date: 08/10/21 General: Elderly white female in NAD Heart:  Regular rate and rhythm; no murmurs Lungs: Respirations even and unlabored, lungs CTA bilaterally Abdomen:  Soft, nontender and nondistended. Normal bowel sounds. Extremities:  Without edema. Neurologic:  Alert and oriented,  grossly normal neurologically. Psych:  Cooperative. Normal mood and affect.  Intake/Output from previous day: 01/01 0701 - 01/02 0700 In: 665 [P.O.:665] Out: 2600 [Urine:2600] Intake/Output this shift: No intake/output data recorded.  Lab Results: Recent Labs    08/09/21 1847 08/10/21 1146 08/10/21 2300 08/11/21 1146 08/12/21 0006  WBC 3.1* 2.6*  --   --   --   HGB 8.1* 8.4* 7.5* 8.2* 8.3*  HCT 26.8* 26.1* 23.9* 26.8* 25.0*  PLT 193 194  --   --   --    BMET Recent Labs    08/09/21 1847 08/11/21 0020 08/12/21 0006  NA 131* 135 132*  K 4.3 4.2 3.5  CL 101 102 96*  CO2 24 26 28   GLUCOSE 100* 108* 127*  BUN 15 14 16   CREATININE 0.64 0.72 0.66  CALCIUM 9.2 9.0 9.0      Assessment / Plan:    #19 88 year old white female with iron deficiency anemia, heme positive with reports of dark stools and daily NSAID use Hemoglobin has been stable, no transfusion requirement since admission Received IV iron yesterday  #2 lower extremity edema-has diuresed over 5 L 2D echo in progress  today  #3 history of breast cancer status postmastectomy #4 remote history of bowel obstruction with resection #5 hypertension  Plan:  Regular diet today, n.p.o. after midnight Continue to trend hemoglobin Continue IV Protonix once daily Patient has been scheduled for upper endoscopy with Dr. Henrene Pastor tomorrow 08/13/2021.  Procedure was discussed in detail with the patient including indications risk and benefits and she is agreeable to proceed  Follow-up 2D echo Add tTG and IgA    LOS: 2 days   Amy EsterwoodPA-C  08/12/2021, 8:48 AM    Attending Physician Note   I have taken an interval history, reviewed the chart and examined the patient. I personally saw the patient and performed more than 50% of this encounter in conjunction with the APP. I agree with the APP's note, impression and recommendations. My additional impressions and recommendations are as follows.   *IDA, dark heme + stools, NSAID usage for EGD Tuesday with Dr. Henrene Pastor. Check tTG, IgA. Pantoprazole 40 mg qd.  *Bilateral lower extremity edema, duiresed almost 5 L in past 24 hours. Echo pending  Lucio Edward, MD [facility] See Shea Evans, [facility], for our on call provider

## 2021-08-12 NOTE — Progress Notes (Signed)
°  Echocardiogram 2D Echocardiogram has been performed.  Cassandra Wu 08/12/2021, 10:00 AM

## 2021-08-12 NOTE — Evaluation (Signed)
Occupational Therapy Evaluation Patient Details Name: Cassandra Wu MRN: [id number] DOB: 1924/09/10 Today's Date: 08/12/2021   History of Present Illness Cassandra Wu is a 88 y.o. female who presents for evaluation of SOB that is worse with exertion and swelling of legs, also found to have GI bleed. with medical history significant for HTN, breast cancer s/p bilateral mastectomy   Clinical Impression   This 88 yo female admitted with above presents to acute OT with PLOF of being able to get around on her own with a rollator and toilet herself, but needed A with basic ADLs and IADLs from aid she has 6 days a week for 3 hours a day and daughters A as well. Currently she is painful when moving arms and legs and did not (could not) initiate movement of arms or legs (due to this). Pt Max A to roll left, Max A to come up to sit, and total A +2 to lay back down with sitting balance on EOB fair. She will continue to benefit from acute OT with follow up at SNF.     Recommendations for follow up therapy are one component of a multi-disciplinary discharge planning process, led by the attending physician.  Recommendations may be updated based on patient status, additional functional criteria and insurance authorization.   Follow Up Recommendations  Skilled nursing-short term rehab (<3 hours/day)    Assistance Recommended at Discharge Frequent or constant Supervision/Assistance  Functional Status Assessment  Patient has had a recent decline in their functional status and demonstrates the ability to make significant improvements in function in a reasonable and predictable amount of time.  Equipment Recommendations  Other (comment) (TBD next venue)       Precautions / Restrictions Precautions Precautions: Fall Precaution Comments: painful arms and legs with any movement Restrictions Weight Bearing Restrictions: No      Mobility Bed Mobility Overal bed mobility: Needs  Assistance Bed Mobility: Rolling;Sidelying to Sit;Sit to Supine Rolling: Max assist Sidelying to sit: TMax assist   Sit to supine: Total assist;+2 for physical assistance   General bed mobility comments: limited initiation of moving Bil LEs (due to painful this AM), could not initiate coming up to sit on her own today    Transfers                   General transfer comment: Did not attempt with +1 A today due to increased need for A even to get up to EOB today and ECHO coming in to complete test      Balance Overall balance assessment: Needs assistance Sitting-balance support: No upper extremity supported;Feet supported Sitting balance-Leahy Scale: Fair                                     ADL either performed or assessed with clinical judgement   ADL Overall ADL's : Needs assistance/impaired Eating/Feeding: NPO   Grooming: Total assistance;Bed level   Upper Body Bathing: Total assistance;Bed level   Lower Body Bathing: Total assistance;Bed level   Upper Body Dressing : Total assistance;Bed level   Lower Body Dressing: Total assistance;Bed level                 General ADL Comments: increased A needed with ADLs due to pt reporting pain in all 4 extremities with any movement (legs edematous despite SCDs on)     Vision Baseline Vision/History: 1 Wears glasses Ability  to See in Adequate Light: 0 Adequate Patient Visual Report: No change from baseline              Pertinent Vitals/Pain Pain Assessment: Faces Faces Pain Scale: Hurts even more Pain Location: bilateral legs and arms; left side of neck Pain Descriptors / Indicators: Aching;Sore Pain Intervention(s): Limited activity within patient's tolerance;Monitored during session;Repositioned     Hand Dominance Right   Extremity/Trunk Assessment Upper Extremity Assessment Upper Extremity Assessment: Generalized weakness (painful, had to use RUE to help move LUE to bedrail)            Communication Communication Communication: No difficulties   Cognition Arousal/Alertness: Awake/alert Behavior During Therapy: WFL for tasks assessed/performed Overall Cognitive Status: Within Functional Limits for tasks assessed                                                  Home Living Family/patient expects to be discharged to:: Skilled nursing facility Living Arrangements: Alone Available Help at Discharge: Personal care attendant (am hours M-Sat) Type of Home: House Home Access: Ramped entrance     Home Layout: One level;Laundry or work area in basement     [employer] Shower/Tub: International aid/development worker Accessibility: Yes How Accessible: Accessible via walker Home Equipment: Conservation officer, nature (2 wheels);Rollator (4 wheels);Tub bench   Additional Comments: Enjoys going out, but doesn't do that much; no longer drives      Prior Functioning/Environment Prior Level of Function : Independent/Modified Independent;Needs assist       Physical Assist : ADLs (physical)   ADLs (physical): IADLs Mobility Comments: Typically uses a Rollator RW; Reports no difficulty with transfers or getting in and out of bed normally ADLs Comments: Aide assists; Reports recent difficulties with shoulder pain and hand movements        OT Problem List: Decreased strength;Decreased range of motion;Decreased activity tolerance;Impaired balance (sitting and/or standing);Pain      OT Treatment/Interventions: Self-care/ADL training;DME and/or AE instruction;Patient/family education;Balance training;Therapeutic activities;Therapeutic exercise    OT Goals(Current goals can be found in the care plan section) Acute Rehab OT Goals Patient Stated Goal: to be able to move again on my own OT Goal Formulation: With patient Time For Goal Achievement: 08/26/21 Potential to Achieve Goals: Good  OT Frequency: Min 2X/week   Barriers to D/C: Decreased caregiver support              AM-PAC OT "6 Clicks" Daily Activity     Outcome Measure Help from another person eating meals?: Total Help from another person taking care of personal grooming?: Total Help from another person toileting, which includes using toliet, bedpan, or urinal?: Total Help from another person bathing (including washing, rinsing, drying)?: Total Help from another person to put on and taking off regular upper body clothing?: Total Help from another person to put on and taking off regular lower body clothing?: Total 6 Click Score: 6   End of Session Nurse Communication: Need for lift equipment (if OOB, pain in legs and arms, increased need for A for in and OOB today via chat text)  Activity Tolerance: Patient limited by pain Patient left: in bed;with call bell/phone within reach (ECHO being completed)  OT Visit Diagnosis: Other abnormalities of gait and mobility (R26.89);Muscle weakness (generalized) (M62.81);Pain Pain - Right/Left:  (both) Pain - part of body:  (arms, legs,  left side of neck)                Time: 2902-1115 OT Time Calculation (min): 28 min Charges:  OT General Charges $OT Visit: 1 Visit OT Evaluation $OT Eval Moderate Complexity: 1 Mod OT Treatments $Self Care/Home Management : 8-22 mins Golden Circle, OTR/L Acute [employer] Pager [phone] Office [phone]    Almon Register 08/12/2021, 9:31 AM

## 2021-08-13 ENCOUNTER — Encounter (HOSPITAL_COMMUNITY)
Admission: EM | Disposition: A | Discharge status: Skilled Nursing Facility | Payer: Self-pay | Source: Home / Self Care | Attending: Student

## 2021-08-13 ENCOUNTER — Encounter (HOSPITAL_COMMUNITY): Payer: Self-pay | Admitting: Family Medicine

## 2021-08-13 ENCOUNTER — Inpatient Hospital Stay (HOSPITAL_COMMUNITY): Payer: Medicare HMO | Admitting: Certified Registered"

## 2021-08-13 DIAGNOSIS — I5033 Acute on chronic diastolic (congestive) heart failure: Secondary | ICD-10-CM

## 2021-08-13 DIAGNOSIS — I5032 Chronic diastolic (congestive) heart failure: Secondary | ICD-10-CM

## 2021-08-13 DIAGNOSIS — K250 Acute gastric ulcer with hemorrhage: Secondary | ICD-10-CM

## 2021-08-13 DIAGNOSIS — K254 Chronic or unspecified gastric ulcer with hemorrhage: Secondary | ICD-10-CM

## 2021-08-13 DIAGNOSIS — K222 Esophageal obstruction: Secondary | ICD-10-CM

## 2021-08-13 DIAGNOSIS — K259 Gastric ulcer, unspecified as acute or chronic, without hemorrhage or perforation: Secondary | ICD-10-CM

## 2021-08-13 HISTORY — PX: BIOPSY: SHX5522

## 2021-08-13 HISTORY — PX: ESOPHAGOGASTRODUODENOSCOPY (EGD) WITH PROPOFOL: SHX5813

## 2021-08-13 LAB — BASIC METABOLIC PANEL
Anion gap: 8 (ref 5–15)
BUN: 14 mg/dL (ref 8–23)
CO2: 27 mmol/L (ref 22–32)
Calcium: 8.6 mg/dL — ABNORMAL LOW (ref 8.9–10.3)
Chloride: 92 mmol/L — ABNORMAL LOW (ref 98–111)
Creatinine, Ser: 0.64 mg/dL (ref 0.44–1.00)
GFR, Estimated: >60 mL/min (ref >60)
Glucose, Bld: 114 mg/dL — ABNORMAL HIGH (ref 70–99)
Potassium: 3.4 mmol/L — ABNORMAL LOW (ref 3.5–5.1)
Sodium: 127 mmol/L — ABNORMAL LOW (ref 135–145)

## 2021-08-13 LAB — HEMOGLOBIN AND HEMATOCRIT, BLOOD
HCT: 23.4 % — ABNORMAL LOW (ref 36.0–46.0)
HCT: 21.7 % — ABNORMAL LOW (ref 36.0–46.0)
Hemoglobin: 7.8 g/dL — ABNORMAL LOW (ref 12.0–15.0)
Hemoglobin: 7.2 g/dL — ABNORMAL LOW (ref 12.0–15.0)

## 2021-08-13 LAB — MAGNESIUM: Magnesium: 1.7 mg/dL (ref 1.7–2.4)

## 2021-08-13 LAB — GLIA (IGA/G) + TTG IGA
Antigliadin Abs, IgA: 5 units (ref 0–19)
Gliadin IgG: 2 units (ref 0–19)
Tissue Transglutaminase Ab, IgA: <2 U/mL (ref 0–3)

## 2021-08-13 SURGERY — ESOPHAGOGASTRODUODENOSCOPY (EGD) WITH PROPOFOL
Anesthesia: Monitor Anesthesia Care

## 2021-08-13 MED ORDER — PANTOPRAZOLE SODIUM 40 MG PO TBEC
40.0000 mg | DELAYED_RELEASE_TABLET | Freq: Every day | ORAL | Status: DC
  Administered 2021-08-14 – 2021-08-16 (×3): 40 mg via ORAL
  Filled 2021-08-13 (×3): qty 1

## 2021-08-13 MED ORDER — POTASSIUM CHLORIDE IN NACL 20-0.9 MEQ/L-% IV SOLN
INTRAVENOUS | Status: DC
Start: 2021-08-13 — End: 2021-08-13

## 2021-08-13 MED ORDER — AMISULPRIDE (ANTIEMETIC) 5 MG/2ML IV SOLN: 10.0000 mg | Freq: Once | INTRAVENOUS | Status: DC | PRN

## 2021-08-13 MED ORDER — FERROUS SULFATE 325 (65 FE) MG PO TABS
325.0000 mg | ORAL_TABLET | Freq: Two times a day (BID) | ORAL | Status: DC
  Administered 2021-08-13 – 2021-08-16 (×6): 325 mg via ORAL
  Filled 2021-08-13 (×6): qty 1

## 2021-08-13 MED ORDER — SODIUM CHLORIDE 0.9 % IV SOLN
250.0000 mg | Freq: Once | INTRAVENOUS | Status: AC
  Administered 2021-08-13: 250 mg via INTRAVENOUS
  Filled 2021-08-13: qty 20

## 2021-08-13 MED ORDER — LACTULOSE 10 GM/15ML PO SOLN
20.0000 g | Freq: Once | ORAL | Status: AC
  Administered 2021-08-13: 20 g via ORAL
  Filled 2021-08-13: qty 30

## 2021-08-13 MED ORDER — ONDANSETRON HCL 4 MG/2ML IJ SOLN: 4.0000 mg | Freq: Once | INTRAMUSCULAR | Status: DC | PRN

## 2021-08-13 MED ORDER — POTASSIUM CHLORIDE 10 MEQ/100ML IV SOLN
10.0000 meq | Freq: Once | INTRAVENOUS | Status: AC
  Administered 2021-08-13: 10 meq via INTRAVENOUS
  Filled 2021-08-13: qty 100

## 2021-08-13 MED ORDER — PROPOFOL 500 MG/50ML IV EMUL
INTRAVENOUS | Status: DC | PRN
  Administered 2021-08-13: 75 ug/kg/min via INTRAVENOUS

## 2021-08-13 MED ORDER — IRON DEXTRAN 50 MG/ML IJ SOLN: 100.0000 mg | Freq: Once | INTRAMUSCULAR | Status: DC

## 2021-08-13 SURGICAL SUPPLY — 15 items

## 2021-08-13 NOTE — Anesthesia Postprocedure Evaluation (Signed)
Anesthesia Post Note  Patient: Cassandra Wu  Procedure(s) Performed: ESOPHAGOGASTRODUODENOSCOPY (EGD) WITH PROPOFOL BIOPSY     Patient location during evaluation: PACU Anesthesia Type: MAC Level of consciousness: awake and alert Pain management: pain level controlled Vital Signs Assessment: post-procedure vital signs reviewed and stable Respiratory status: spontaneous breathing, nonlabored ventilation, respiratory function stable and patient connected to nasal cannula oxygen Cardiovascular status: stable and blood pressure returned to baseline Postop Assessment: no apparent nausea or vomiting Anesthetic complications: no   No notable events documented.  Last Vitals:  Vitals:   08/13/21 1241 08/13/21 1309  BP:  133/70  Pulse: 90 91  Resp: (!) 23 16  Temp: 37.2 C   SpO2: 95% 94%    Last Pain:  Vitals:   08/13/21 1309  TempSrc: Oral  PainSc:                  Effie Berkshire

## 2021-08-13 NOTE — Care Management Important Message (Signed)
Important Message  Patient Details  Name: Cassandra Wu MRN: [id number] Date of Birth: 03-Mar-1925   Medicare Important Message Given:  Yes     Hannah Beat 08/13/2021, 1:35 PM

## 2021-08-13 NOTE — Progress Notes (Signed)
Physical Therapy Treatment Patient Details Name: Cassandra Wu MRN: [id number] DOB: [birth year] Today's Date: 08/13/2021   History of Present Illness Cassandra Wu is a 88 y.o. female who presents for evaluation of SOB that is worse with exertion and swelling of legs, also found to have GI bleed. with medical history significant for HTN, breast cancer s/p bilateral mastectomy    PT Comments    Continuing work on functional mobility and activity tolerance;  Pt much more sore today than last PT session on 1/1; Needing more assist with bed mobility and transfers; Attempted gait and pre-gait activities, however difficulty getting to upright standing and unable to weight shift fully into single limb stance R and LLEs to allow for stepping; needs 2 person Max assist for bed mobiltiy and transfers; continue to recommend post-acute rehab at SNF level  Recommendations for follow up therapy are one component of a multi-disciplinary discharge planning process, led by the attending physician.  Recommendations may be updated based on patient status, additional functional criteria and insurance authorization.  Follow Up Recommendations  Skilled nursing-short term rehab (<3 hours/day)     Assistance Recommended at Discharge Set up Supervision/Assistance  Patient can return home with the following     Equipment Recommendations  None recommended by PT (Pretty well-equipped; will look to OT recommendations for possible adaptive equipment)    Recommendations for Other Services       Precautions / Restrictions Precautions Precautions: Fall Precaution Comments: painful arms and legs with any movement     Mobility  Bed Mobility Overal bed mobility: Needs Assistance Bed Mobility: Rolling;Sidelying to Sit;Sit to Supine Rolling: Max assist Sidelying to sit: Max assist;+2 for physical assistance   Sit to supine: Total assist;+2 for physical assistance   General bed mobility comments:  Employed Rolling and sidelying to sit technqiue for gettign up to EOB; Pt needed considerably more assist with this technique than on PT eval on 1/1; Total assist to help her lay back down    Transfers Overall transfer level: Needs assistance Equipment used: 2 person hand held assist Transfers: Sit to/from Stand Sit to Stand: +2 physical assistance;Max assist           General transfer comment: Needing considerably more assist to day with sit to stand; stood twice from EOB (slightly elevated); needed bil knees blocked for stability; difficulty coming to fully upright posture    Ambulation/Gait             Pre-gait activities: Attempted gentle march-in-place and sidesteps at EOB, however pt with difficulty wight shifting and gettign into single limb stance (both R and L) to allow for stepping     Stairs             Wheelchair Mobility    Modified Rankin (Stroke Patients Only)       Balance     Sitting balance-Leahy Scale: Poor       Standing balance-Leahy Scale: Zero                              Cognition Arousal/Alertness: Awake/alert Behavior During Therapy: WFL for tasks assessed/performed Overall Cognitive Status: Within Functional Limits for tasks assessed                                          Exercises  General Comments General comments (skin integrity, edema, etc.): Repostioned in bed, supporting more painful RUE on a pillow and applied heat packs; Contacted MD and RN re: mobility status; Noteworthy that it took increased effor tto get her in a more comfortable position back in the bed; I wonder if being in the recliner chair is more comfortable; Will request a geomat air cushion for her recliner      Pertinent Vitals/Pain Pain Assessment: Faces Faces Pain Scale: Hurts even more Pain Location: R UE most painful, bilateral legs and arms; left side of neck Pain Descriptors / Indicators: Aching;Sore Pain  Intervention(s): Monitored during session;Limited activity within patient's tolerance    Home Living                          Prior Function            PT Goals (current goals can now be found in the care plan section) Acute Rehab PT Goals Patient Stated Goal: Be able to walk PT Goal Formulation: With patient Time For Goal Achievement: 08/25/21 Potential to Achieve Goals: Good Progress towards PT goals: Not progressing toward goals - comment (Limited by pain and fatigue today)    Frequency    Min 2X/week      PT Plan Current plan remains appropriate    Co-evaluation              AM-PAC PT "6 Clicks" Mobility   Outcome Measure  Help needed turning from your back to your side while in a flat bed without using bedrails?: A Lot Help needed moving from lying on your back to sitting on the side of a flat bed without using bedrails?: A Lot Help needed moving to and from a bed to a chair (including a wheelchair)?: A Lot Help needed standing up from a chair using your arms (e.g., wheelchair or bedside chair)?: A Lot Help needed to walk in hospital room?: Total Help needed climbing 3-5 steps with a railing? : Total 6 Click Score: 10    End of Session Equipment Utilized During Treatment: Gait belt Activity Tolerance: Patient limited by pain;Patient limited by fatigue Patient left: in bed;with call bell/phone within reach;with bed alarm set (bed in semi-chair position) Nurse Communication: Mobility status;Need for lift equipment PT Visit Diagnosis: Unsteadiness on feet (R26.81);Other abnormalities of gait and mobility (R26.89);Muscle weakness (generalized) (M62.81);History of falling (Z91.81)     Time: 3419-6222 PT Time Calculation (min) (ACUTE ONLY): 32 min  Charges:  $Therapeutic Activity: 23-37 mins                     Roney Marion, [place]  Acute Rehabilitation Services Pager [phone] Office [place] 08/13/2021, 10:06 AM

## 2021-08-13 NOTE — Op Note (Signed)
[facility] Patient Name: Cassandra Wu Procedure Date : 08/13/2021 MRN: [id number] Attending MD: Docia Chuck. Henrene Pastor , MD Date of Birth: Mar 04, 1925 CSN: [id number] Age: 88 Admit Type: Inpatient Procedure:                Upper GI endoscopy with biopsies Indications:              Iron deficiency anemia, reports dark stools, on                            NSAIDs Providers:                Docia Chuck. Henrene Pastor, MD, Jeanella Cara, RN, Tyna Jaksch Technician Referring MD:             [facility] hospitalist Medicines:                Monitored Anesthesia Care Complications:            No immediate complications. Estimated Blood Loss:     Estimated blood loss: none. Procedure:                Pre-Anesthesia Assessment:                           - Prior to the procedure, a History and Physical                            was performed, and patient medications and                            allergies were reviewed. The patient's tolerance of                            previous anesthesia was also reviewed. The risks                            and benefits of the procedure and the sedation                            options and risks were discussed with the patient.                            All questions were answered, and informed consent                            was obtained. Prior Anticoagulants: The patient has                            taken no previous anticoagulant or antiplatelet                            agents. ASA Grade Assessment: III - A patient with  severe systemic disease. After reviewing the risks                            and benefits, the patient was deemed in                            satisfactory condition to undergo the procedure.                           After obtaining informed consent, the endoscope was                            passed under direct vision. Throughout the                             procedure, the patient's blood pressure, pulse, and                            oxygen saturations were monitored continuously. The                            GIF-H190 ([id number] Olympus endoscope was introduced                            through the mouth, and advanced to the second part                            of duodenum. The upper GI endoscopy was                            accomplished without difficulty. The patient                            tolerated the procedure well. Scope In: Scope Out: Findings:      The esophagus revealed a large caliber distal esophageal ring with mild       inflammation.      The esophagus was was normal.      Two cratered gastric ulcers were found in the prepyloric region of the       stomach. As well, several superficial erosions. The largest lesion was 3       mm in largest dimension. There was no active bleeding or stigmata. The       lesions looked NSAID induced. Biopsies were taken with a cold forceps       for histology.      The stomach also revealed a hiatal hernia without erosions but was       otherwise normal.      The examined duodenum was normal.      The cardia and gastric fundus were normal on retroflexion, save hiatal       hernia. Impression:               1. GERD with large caliber esophageal stricture.                           2. Gastric ulcers and erosions without  stigmata.                            Likely cause for dark stools and iron deficiency                            anemia                           3. Otherwise unremarkable EGD. Recommendation:           1. Resume previous diet                           2. Pantoprazole 40 mg daily indefinitely                           3. Follow-up biopsies (we will)                           4. Minimize NSAIDs                           5. Solid iron sulfate 325 mg twice daily with meals                            and                           6. PCP should monitor blood counts as  outpatient to                            assess for adequate response to iron replacement                            therapy                           7. Please reach out for problems or questions.                            Outpatient GI follow-up required at this time. GI                            will sign off. Procedure Code(s):        --- Professional ---                           [phone], Esophagogastroduodenoscopy, flexible,                            transoral; with biopsy, single or multiple Diagnosis Code(s):        --- Professional ---                           K22.2, Esophageal obstruction  K25.9, Gastric ulcer, unspecified as acute or                            chronic, without hemorrhage or perforation                           D50.9, Iron deficiency anemia, unspecified CPT copyright 2019 American Medical Association. All rights reserved. The codes documented in this report are preliminary and upon coder review may  be revised to meet current compliance requirements. Docia Chuck. Henrene Pastor, MD 08/13/2021 12:24:47 PM This report has been signed electronically. Number of Addenda: 0

## 2021-08-13 NOTE — Transfer of Care (Signed)
Immediate Anesthesia Transfer of Care Note  Patient: Cassandra Wu  Procedure(s) Performed: ESOPHAGOGASTRODUODENOSCOPY (EGD) WITH PROPOFOL BIOPSY  Patient Location: PACU  Anesthesia Type:MAC  Level of Consciousness: awake, alert  and oriented  Airway & Oxygen Therapy: Patient Spontanous Breathing and Patient connected to face mask oxygen  Post-op Assessment: Report given to RN and Post -op Vital signs reviewed and stable  Post vital signs: Reviewed and stable  Last Vitals:  Vitals Value Taken Time  BP 126/68 08/13/21 1617  Temp 36.8 C 08/13/21 1617  Pulse 90 08/13/21 1617  Resp 18 08/13/21 1617  SpO2 100 % 08/13/21 1617    Last Pain:  Vitals:   08/13/21 1617  TempSrc: Oral  PainSc:       Patients Stated Pain Goal: 1 (10/93/23 5573)  Complications: No notable events documented.

## 2021-08-13 NOTE — Interval H&P Note (Signed)
History and Physical Interval Note:  08/13/2021 11:32 AM  [name]  has presented today for surgery, with the diagnosis of Iron deficiency anemia, dark stool.  The various methods of treatment have been discussed with the patient and family. After consideration of risks, benefits and other options for treatment, the patient has consented to  Procedure(s): ESOPHAGOGASTRODUODENOSCOPY (EGD) WITH PROPOFOL (N/A) as a surgical intervention.  The patient's history has been reviewed, patient examined, no change in status, stable for surgery.  I have reviewed the patient's chart and labs.  Questions were answered to the patient's satisfaction.     Cassandra Wu

## 2021-08-13 NOTE — Progress Notes (Signed)
PROGRESS NOTE    Cassandra Wu  IZT:[id number] DOB: [birth year] DOA: 08/09/2021 PCP: Joseph Art, MD    Brief Narrative:  88 year old female with past medical history of hypertension, breast cancer status postmastectomy and obesity presented to the emergency room on 12/30 with complaints of shortness of breath and lower extremity swelling as well as black tarry stools for the past 1 to 2 weeks.  In the emergency department, patient's stool found to be heme positive with a hemoglobin of 8.1.  Getting IV iron.  Admitted to the hospitalist service with gastroenterology consulted, who plan to take patient for colonoscopy and EGD on Tuesday or Wednesday.  Echocardiogram showed ejection fraction 60 to 38% with diastolic dysfunction..  Patient has responded with IV Lasix and has diuresed almost 5 L.   Assessment & Plan:   Principal Problem:   GI bleed Active Problems:   Essential hypertension   DEPENDENT EDEMA, LEGS, BILATERAL   Anemia   Dyspnea   Overweight (BMI 25.0-29.9)   Heme positive stool   Acute gastric ulcer with hemorrhage   Acute on chronic diastolic CHF (congestive heart failure) (HCC)  Acute on chronic diastolic congestive heart failure.  Ruled in Essential hypertension. Patient was receiving IV Lasix 40 mg twice a day for the last 2 days.  Leg edema essentially resolved.  Patient no longer has any short of breath She has mild elevation of BNP, echocardiogram showed ejection fraction 60 to 25% with diastolic dysfunction.  She probably had acute on chronic diastolic congestive heart failure at time admission. Patient condition has much improved, Lasix will change to oral at 40 mg daily yesterday. Patient still has significant urine output, appears to be dry, discontinue Lasix at this point.  Hyponatremia. Hypokalemia Patient appears relatively dry at this point, discontinue Lasix.  Replete potassium. Recheck BMP tomorrow.   GI bleed. Acute blood loss anemia   Iron deficient anemia Patient received IV iron x2.  Continue PPI. Patient is seen by GI, EGD performed today.    DVT prophylaxis: SCDs Code Status: full Family Communication: Not able to reach daughter Disposition Plan: snf    Status is: Inpatient  Remains inpatient appropriate because: severity of disease        I/O last 3 completed shifts: In: -  Out: 1200 [Urine:1200] No intake/output data recorded.     Consultants:  GI  Procedures: EGD pending  Antimicrobials: None  Subjective: Patient doing well today, denies any short of breath or cough.  Leg edema essentially resolved. No abdominal pain nausea vomiting. No fever or chills.  Objective: Vitals:   08/13/21 0412 08/13/21 0811 08/13/21 1111 08/13/21 1210  BP: 130/67 (!) 151/81 (!) 164/79 (!) 141/77  Pulse: 88 86 90 89  Resp: 16 17 (!) 28 (!) 26  Temp: 97.9 F (36.6 C) 98.9 F (37.2 C) 99.8 F (37.7 C) 98.3 F (36.8 C)  TempSrc:  Oral Temporal   SpO2: 95% 96% 97% 99%  Weight:      Height:       No intake or output data in the 24 hours ending 08/13/21 1222 Filed Weights   08/09/21 1839 08/11/21 0611 08/12/21 0500  Weight: 83.9 kg 71.4 kg 70.7 kg    Examination:  General exam: Appears calm and comfortable  Respiratory system: Clear to auscultation. Respiratory effort normal. Cardiovascular system: S1 & S2 heard, RRR. No JVD, murmurs, rubs, gallops or clicks. No pedal edema. Gastrointestinal system: Abdomen is nondistended, soft and nontender. No organomegaly or masses felt.  Normal bowel sounds heard. Central nervous system: Alert and oriented x2. No focal neurological deficits. Extremities: Symmetric 5 x 5 power. Skin: No rashes, lesions or ulcers Psychiatry: Judgement and insight appear normal. Mood & affect appropriate.     Data Reviewed: I have personally reviewed following labs and imaging studies  CBC: Recent Labs  Lab 08/09/21 1847 08/10/21 1146 08/10/21 2300 08/11/21 1146  08/12/21 0006 08/12/21 1154 08/13/21 0047  WBC 3.1* 2.6*  --   --   --   --   --   HGB 8.1* 8.4* 7.5* 8.2* 8.3* 8.5* 7.8*  HCT 26.8* 26.1* 23.9* 26.8* 25.0* 26.6* 23.4*  MCV 81.5 79.3*  --   --   --   --   --   PLT 193 194  --   --   --   --   --    Basic Metabolic Panel: Recent Labs  Lab 08/09/21 1748 08/09/21 1847 08/11/21 0020 08/12/21 0006 08/13/21 0047  NA 137 131* 135 132* 127*  K 3.9 4.3 4.2 3.5 3.4*  CL 105 101 102 96* 92*  CO2 23 24 26 28 27   GLUCOSE 90 100* 108* 127* 114*  BUN 11 15 14 16 14   CREATININE 0.56 0.64 0.72 0.66 0.64  CALCIUM 9.1 9.2 9.0 9.0 8.6*  MG  --   --   --   --  1.7   GFR: Estimated Creatinine Clearance: 41.5 mL/min (by C-G formula based on SCr of 0.64 mg/dL). Liver Function Tests: No results for input(s): AST, ALT, ALKPHOS, BILITOT, PROT, ALBUMIN in the last 168 hours. No results for input(s): LIPASE, AMYLASE in the last 168 hours. No results for input(s): AMMONIA in the last 168 hours. Coagulation Profile: No results for input(s): INR, PROTIME in the last 168 hours. Cardiac Enzymes: No results for input(s): CKTOTAL, CKMB, CKMBINDEX, TROPONINI in the last 168 hours. BNP (last 3 results) No results for input(s): PROBNP in the last 8760 hours. HbA1C: No results for input(s): HGBA1C in the last 72 hours. CBG: No results for input(s): GLUCAP in the last 168 hours. Lipid Profile: No results for input(s): CHOL, HDL, LDLCALC, TRIG, CHOLHDL, LDLDIRECT in the last 72 hours. Thyroid Function Tests: No results for input(s): TSH, T4TOTAL, FREET4, T3FREE, THYROIDAB in the last 72 hours. Anemia Panel: Recent Labs    08/12/21 1154  VITAMINB12 473  FERRITIN 14  TIBC 315  IRON 14*   Sepsis Labs: No results for input(s): PROCALCITON, LATICACIDVEN in the last 168 hours.  Recent Results (from the past 240 hour(s))  Resp Panel by RT-PCR (Flu A&B, Covid) Nasopharyngeal Swab     Status: None   Collection Time: 08/10/21  4:09 AM   Specimen:  Nasopharyngeal Swab; Nasopharyngeal(NP) swabs in vial transport medium  Result Value Ref Range Status   SARS Coronavirus 2 by RT PCR NEGATIVE NEGATIVE Final    Comment: (NOTE) SARS-CoV-2 target nucleic acids are NOT DETECTED.  The SARS-CoV-2 RNA is generally detectable in upper respiratory specimens during the acute phase of infection. The lowest concentration of SARS-CoV-2 viral copies this assay can detect is 138 copies/mL. A negative result does not preclude SARS-Cov-2 infection and should not be used as the sole basis for treatment or other patient management decisions. A negative result may occur with  improper specimen collection/handling, submission of specimen other than nasopharyngeal swab, presence of viral mutation(s) within the areas targeted by this assay, and inadequate number of viral copies(<138 copies/mL). A negative result must be combined with clinical observations,  patient history, and epidemiological information. The expected result is Negative.  Fact Sheet for Patients:  EntrepreneurPulse.com.au  Fact Sheet for Healthcare Providers:  IncredibleEmployment.be  This test is no t yet approved or cleared by the [place] FDA and  has been authorized for detection and/or diagnosis of SARS-CoV-2 by FDA under an Emergency Use Authorization (EUA). This EUA will remain  in effect (meaning this test can be used) for the duration of the COVID-19 declaration under Section 564(b)(1) of the Act, 21 U.S.C.section 360bbb-3(b)(1), unless the authorization is terminated  or revoked sooner.       Influenza A by PCR NEGATIVE NEGATIVE Final   Influenza B by PCR NEGATIVE NEGATIVE Final    Comment: (NOTE) The Xpert Xpress SARS-CoV-2/FLU/RSV plus assay is intended as an aid in the diagnosis of influenza from Nasopharyngeal swab specimens and should not be used as a sole basis for treatment. Nasal washings and aspirates are unacceptable for  Xpert Xpress SARS-CoV-2/FLU/RSV testing.  Fact Sheet for Patients: EntrepreneurPulse.com.au  Fact Sheet for Healthcare Providers: IncredibleEmployment.be  This test is not yet approved or cleared by the [place] FDA and has been authorized for detection and/or diagnosis of SARS-CoV-2 by FDA under an Emergency Use Authorization (EUA). This EUA will remain in effect (meaning this test can be used) for the duration of the COVID-19 declaration under Section 564(b)(1) of the Act, 21 U.S.C. section 360bbb-3(b)(1), unless the authorization is terminated or revoked.  Performed at [facility], [place] [address], [place], [place] [address]          Radiology Studies: ECHOCARDIOGRAM COMPLETE  Result Date: 08/12/2021    ECHOCARDIOGRAM REPORT   Patient Name:   JAMYRA ZWEIG Pyatt Date of Exam: 08/12/2021 Medical Rec #:  [id number]             Height:       64.0 in Accession #:    [id number]            Weight:       155.9 lb Date of Birth:  [birth year]            BSA:          1.760 m Patient Age:    44 years              BP:           138/80 mmHg Patient Gender: F                     HR:           91 bpm. Exam Location:  Inpatient Procedure: 2D Echo, Cardiac Doppler and Color Doppler Indications:    Dyspnea  History:        Patient has no prior history of Echocardiogram examinations.                 Risk Factors:Hypertension.  Sonographer:    Bernadene Person RDCS Referring Phys: [id number] [place]  1. Left ventricular ejection fraction, by estimation, is 60 to 65%. The left ventricle has normal function. The left ventricle has no regional wall motion abnormalities. There is mild left ventricular hypertrophy. Left ventricular diastolic parameters are consistent with Grade I diastolic dysfunction (impaired relaxation). Elevated left atrial pressure.  2. Right ventricular systolic function is normal. The right ventricular size is normal. There  is normal pulmonary artery systolic pressure.  3. Left atrial size was mildly dilated.  4. The mitral valve is normal  in structure. No evidence of mitral valve regurgitation. No evidence of mitral stenosis.  5. The aortic valve is tricuspid. There is mild calcification of the aortic valve. There is mild thickening of the aortic valve. Aortic valve regurgitation is not visualized. No aortic stenosis is present.  6. The inferior vena cava is normal in size with <50% respiratory variability, suggesting right atrial pressure of 8 mmHg. FINDINGS  Left Ventricle: Left ventricular ejection fraction, by estimation, is 60 to 65%. The left ventricle has normal function. The left ventricle has no regional wall motion abnormalities. The left ventricular internal cavity size was normal in size. There is  mild left ventricular hypertrophy. Left ventricular diastolic parameters are consistent with Grade I diastolic dysfunction (impaired relaxation). Elevated left atrial pressure. Right Ventricle: The right ventricular size is normal. No increase in right ventricular wall thickness. Right ventricular systolic function is normal. There is normal pulmonary artery systolic pressure. The tricuspid regurgitant velocity is 2.27 m/s, and  with an assumed right atrial pressure of 8 mmHg, the estimated right ventricular systolic pressure is 63.7 mmHg. Left Atrium: Left atrial size was mildly dilated. Right Atrium: Right atrial size was normal in size. Pericardium: There is no evidence of pericardial effusion. Mitral Valve: The mitral valve is normal in structure. No evidence of mitral valve regurgitation. No evidence of mitral valve stenosis. Tricuspid Valve: The tricuspid valve is normal in structure. Tricuspid valve regurgitation is mild . No evidence of tricuspid stenosis. Aortic Valve: The aortic valve is tricuspid. There is mild calcification of the aortic valve. There is mild thickening of the aortic valve. There is mild aortic valve  annular calcification. Aortic valve regurgitation is not visualized. No aortic stenosis  is present. Aortic valve mean gradient measures 4.1 mmHg. Aortic valve peak gradient measures 9.7 mmHg. Aortic valve area, by VTI measures 2.81 cm. Pulmonic Valve: The pulmonic valve was not well visualized. Pulmonic valve regurgitation is not visualized. No evidence of pulmonic stenosis. Aorta: The aortic root is normal in size and structure. Venous: The inferior vena cava is normal in size with less than 50% respiratory variability, suggesting right atrial pressure of 8 mmHg. IAS/Shunts: The interatrial septum was not well visualized.  LEFT VENTRICLE PLAX 2D LVIDd:         3.60 cm   Diastology LVIDs:         2.50 cm   LV e' medial:    4.68 cm/s LV PW:         1.10 cm   LV E/e' medial:  18.6 LV IVS:        1.10 cm   LV e' lateral:   7.01 cm/s LVOT diam:     2.00 cm   LV E/e' lateral: 12.4 LV SV:         75 LV SV Index:   43 LVOT Area:     3.14 cm  RIGHT VENTRICLE RV S prime:     11.70 cm/s TAPSE (M-mode): 1.9 cm LEFT ATRIUM             Index        RIGHT ATRIUM           Index LA diam:        3.70 cm 2.10 cm/m   RA Area:     12.60 cm LA Vol (A2C):   53.5 ml 30.40 ml/m  RA Volume:   27.40 ml  15.57 ml/m LA Vol (A4C):   60.3 ml 34.27 ml/m LA Biplane Vol:  59.2 ml 33.64 ml/m  AORTIC VALVE AV Area (Vmax):    2.77 cm AV Area (Vmean):   3.10 cm AV Area (VTI):     2.81 cm AV Vmax:           155.66 cm/s AV Vmean:          91.335 cm/s AV VTI:            0.267 m AV Peak Grad:      9.7 mmHg AV Mean Grad:      4.1 mmHg LVOT Vmax:         137.00 cm/s LVOT Vmean:        90.000 cm/s LVOT VTI:          0.239 m LVOT/AV VTI ratio: 0.90  AORTA Ao Root diam: 3.40 cm Ao Asc diam:  3.30 cm MITRAL VALVE                TRICUSPID VALVE MV Area (PHT): 3.91 cm     TR Peak grad:   20.6 mmHg MV Decel Time: 194 msec     TR Vmax:        227.00 cm/s MV E velocity: 86.90 cm/s MV A velocity: 124.00 cm/s  SHUNTS MV E/A ratio:  0.70         Systemic  VTI:  0.24 m                             Systemic Diam: 2.00 cm Carlyle Dolly MD Electronically signed by Carlyle Dolly MD Signature Date/Time: 08/12/2021/11:05:39 AM    Final         Scheduled Meds:  [MAR Hold] ALPRAZolam  0.25 mg Oral BID   [MAR Hold] aspirin EC  81 mg Oral Daily   [MAR Hold] lactulose  20 g Oral Once   [MAR Hold] pantoprazole (PROTONIX) IV  40 mg Intravenous Q24H   [MAR Hold] sodium chloride flush  3 mL Intravenous Q12H   Continuous Infusions:  [MAR Hold] sodium chloride 10 mL/hr at 08/13/21 1148   [MAR Hold] ferric gluconate (FERRLECIT) IVPB       LOS: 3 days    Time spent: 28 minutes    Sharen Hones, MD [facility]   To contact the attending provider between 7A-7P or the covering provider during after hours 7P-7A, please log into the web site [email or web address] and access using universal [place] password for that web site. If you do not have the password, please call the hospital operator.  08/13/2021, 12:22 PM

## 2021-08-13 NOTE — TOC Initial Note (Signed)
Transition of Care [facility]) - Initial/Assessment Note    Patient Details  Name: Cassandra Wu MRN: [id number] Date of Birth: 12-13-1924  Transition of Care [facility]) CM/SW Contact:    Emeterio Reeve, LCSW Phone Number: 08/13/2021, 4:31 PM  Clinical Narrative:                  CSW received SNF consult. CSW met with pt at bedside. CSW introduced self and explained role at the hospital. Pt reports that PTA lives at home alone. Pt reports She is independent with mobility. Pt has Aids that come 4hrs a day 6 days a week and they assist with some ADLs and house upkeep.   CSW reviewed PT/OT recommendations for SNF. Pt reports she is fine with SNF but wants to go in [place]. Pt asked CSW to call her daughter. CSW gave same information to daughter.   Pt  and daughter gave CSW permission to fax out to facilities in the area. Pt has no preference of facility at this time. CSW gave pt medicare.gov rating list to review. CSW explained insurance auth process. Pt reports they are covid vaccinated.  CSW will continue to follow.   Expected Discharge Plan: Skilled Nursing Facility Barriers to Discharge: Continued Medical Work up, Ship broker   Patient Goals and CMS Choice Patient states their goals for this hospitalization and ongoing recovery are:: to go to SNF CMS Medicare.gov Compare Post Acute Care list provided to:: Patient    Expected Discharge Plan and Services Expected Discharge Plan: [place]       Living arrangements for the past 2 months: Single Family Home                                      Prior Living Arrangements/Services Living arrangements for the past 2 months: Single Family Home Lives with:: Self Patient language and need for interpreter reviewed:: Yes Do you feel safe going back to the place where you live?: Yes      Need for Family Participation in Patient Care: Yes (Comment) Care giver support system in place?: Yes  (comment)   Criminal Activity/Legal Involvement Pertinent to Current Situation/Hospitalization: No - Comment as needed  Activities of Daily Living Home Assistive Devices/Equipment: Wheelchair (rollator) ADL Screening (condition at time of admission) Patient's cognitive ability adequate to safely complete daily activities?: Yes Is the patient deaf or have difficulty hearing?: Yes Does the patient have difficulty seeing, even when wearing glasses/contacts?: No Does the patient have difficulty concentrating, remembering, or making decisions?: No Patient able to express need for assistance with ADLs?: Yes Does the patient have difficulty dressing or bathing?: Yes Independently performs ADLs?: No Communication: Independent Dressing (OT): Needs assistance Is this a change from baseline?: Pre-admission baseline Grooming: Appropriate for developmental age Feeding: Appropriate for developmental age Bathing: Needs assistance Is this a change from baseline?: Pre-admission baseline Toileting: Needs assistance Is this a change from baseline?: Pre-admission baseline In/Out Bed: Needs assistance Is this a change from baseline?: Pre-admission baseline Walks in Home: Independent with device (comment), Appropriate for developmental age Does the patient have difficulty walking or climbing stairs?: Yes Weakness of Legs: Both Weakness of Arms/Hands: Both  Permission Sought/Granted Permission sought to share information with : Family Supports, Chartered certified accountant granted to share information with : Yes, Verbal Permission Granted     Permission granted to share info w AGENCY: SNF  Emotional Assessment Appearance:: Appears stated age Attitude/Demeanor/Rapport: Engaged Affect (typically observed): Appropriate Orientation: : Oriented to Self, Oriented to Place, Oriented to  Time, Oriented to Situation Alcohol / Substance Use: Not Applicable Psych Involvement: No  (comment)  Admission diagnosis:  GI bleed [K92.2] Heme positive stool [R19.5] Bilateral lower extremity edema [R60.0] Anemia, unspecified type [D64.9] Dyspnea, unspecified type [R06.00] Patient Active Problem List   Diagnosis Date Noted   Acute on chronic diastolic CHF (congestive heart failure) ([place]) 08/13/2021   Acute gastric ulcer with hemorrhage    Heme positive stool    GI bleed 08/10/2021   Anemia 08/10/2021   Dyspnea 08/10/2021   Overweight (BMI 25.0-29.9) 08/10/2021   CARPAL TUNNEL SYNDROME, RIGHT 07/16/2010   Essential hypertension 03/19/2010   TIA 03/19/2010   LYMPHEDEMA, LEFT ARM 03/19/2010   DEPENDENT EDEMA, LEGS, BILATERAL 03/19/2010   OTHER CHEST PAIN 03/19/2010   MASTECTOMY, BILATERAL, HX OF 03/19/2010   PCP:  Joseph Art, MD Pharmacy:   [id number] [place] [address]Phone: [phone]:: [phone]    Social Determinants of Health (SDOH) Interventions    Readmission Risk Interventions No flowsheet data found.  MEmeterio Reeve LCSW Clinical Social Worker

## 2021-08-13 NOTE — Anesthesia Preprocedure Evaluation (Signed)
Anesthesia Evaluation    Reviewed: Allergy & Precautions, Patient's Chart, lab work & pertinent test results  Airway Mallampati: II  TM Distance: >3 FB Neck ROM: Full    Dental  (+) Upper Dentures   Pulmonary neg pulmonary ROS,    Pulmonary exam normal        Cardiovascular hypertension, Pt. on medications and Pt. on home beta blockers  Rhythm:Regular Rate:Normal     Neuro/Psych  Neuromuscular disease negative psych ROS   GI/Hepatic negative GI ROS, Neg liver ROS,   Endo/Other  negative endocrine ROS  Renal/GU negative Renal ROS     Musculoskeletal negative musculoskeletal ROS (+)   Abdominal Normal abdominal exam  (+)   Peds  Hematology   Anesthesia Other Findings   Reproductive/Obstetrics                             Anesthesia Physical Anesthesia Plan  ASA: 2  Anesthesia Plan: MAC   Post-op Pain Management:    Induction: Intravenous  PONV Risk Score and Plan: 0 and Propofol infusion  Airway Management Planned: Natural Airway and Simple Face Mask  Additional Equipment: None  Intra-op Plan:   Post-operative Plan: Extubation in OR  Informed Consent: I have reviewed the patients History and Physical, chart, labs and discussed the procedure including the risks, benefits and alternatives for the proposed anesthesia with the patient or authorized representative who has indicated his/her understanding and acceptance.       Plan Discussed with: CRNA  Anesthesia Plan Comments:         Anesthesia Quick Evaluation

## 2021-08-13 NOTE — Anesthesia Procedure Notes (Signed)
Procedure Name: MAC Date/Time: 08/13/2021 11:50 AM Performed by: Griffin Dakin, CRNA Pre-anesthesia Checklist: Patient identified, Emergency Drugs available, Suction available, Patient being monitored and Timeout performed Patient Re-evaluated:Patient Re-evaluated prior to induction Oxygen Delivery Method: Simple face mask Induction Type: IV induction Airway Equipment and Method: Patient positioned with wedge pillow Placement Confirmation: positive ETCO2 and breath sounds checked- equal and bilateral Dental Injury: Teeth and Oropharynx as per pre-operative assessment

## 2021-08-14 ENCOUNTER — Encounter (HOSPITAL_COMMUNITY): Payer: Self-pay | Admitting: Internal Medicine

## 2021-08-14 DIAGNOSIS — E871 Hypo-osmolality and hyponatremia: Secondary | ICD-10-CM

## 2021-08-14 DIAGNOSIS — K250 Acute gastric ulcer with hemorrhage: Principal | ICD-10-CM

## 2021-08-14 DIAGNOSIS — Z853 Personal history of malignant neoplasm of breast: Secondary | ICD-10-CM

## 2021-08-14 DIAGNOSIS — E876 Hypokalemia: Secondary | ICD-10-CM

## 2021-08-14 DIAGNOSIS — R5381 Other malaise: Secondary | ICD-10-CM

## 2021-08-14 DIAGNOSIS — D500 Iron deficiency anemia secondary to blood loss (chronic): Secondary | ICD-10-CM

## 2021-08-14 LAB — HEMOGLOBIN AND HEMATOCRIT, BLOOD
HCT: 24 % — ABNORMAL LOW (ref 36.0–46.0)
HCT: 28 % — ABNORMAL LOW (ref 36.0–46.0)
Hemoglobin: 7.6 g/dL — ABNORMAL LOW (ref 12.0–15.0)
Hemoglobin: 8.8 g/dL — ABNORMAL LOW (ref 12.0–15.0)

## 2021-08-14 LAB — BASIC METABOLIC PANEL
Anion gap: 8 (ref 5–15)
BUN: 14 mg/dL (ref 8–23)
CO2: 28 mmol/L (ref 22–32)
Calcium: 8.7 mg/dL — ABNORMAL LOW (ref 8.9–10.3)
Chloride: 94 mmol/L — ABNORMAL LOW (ref 98–111)
Creatinine, Ser: 0.63 mg/dL (ref 0.44–1.00)
GFR, Estimated: >60 mL/min (ref >60)
Glucose, Bld: 113 mg/dL — ABNORMAL HIGH (ref 70–99)
Potassium: 3.4 mmol/L — ABNORMAL LOW (ref 3.5–5.1)
Sodium: 130 mmol/L — ABNORMAL LOW (ref 135–145)

## 2021-08-14 LAB — MAGNESIUM: Magnesium: 1.8 mg/dL (ref 1.7–2.4)

## 2021-08-14 MED ORDER — SENNOSIDES-DOCUSATE SODIUM 8.6-50 MG PO TABS
1.0000 | ORAL_TABLET | Freq: Two times a day (BID) | ORAL | Status: DC
  Administered 2021-08-14 – 2021-08-16 (×5): 1 via ORAL
  Filled 2021-08-14 (×5): qty 1

## 2021-08-14 MED ORDER — PANTOPRAZOLE SODIUM 40 MG PO TBEC: 40.0000 mg | DELAYED_RELEASE_TABLET | Freq: Every day | ORAL | 1 refills | Status: DC

## 2021-08-14 MED ORDER — POLYETHYLENE GLYCOL 3350 17 G PO PACK
17.0000 g | PACK | Freq: Two times a day (BID) | ORAL | Status: DC
  Administered 2021-08-14 – 2021-08-16 (×2): 17 g via ORAL
  Filled 2021-08-14 (×3): qty 1

## 2021-08-14 MED ORDER — SENNOSIDES-DOCUSATE SODIUM 8.6-50 MG PO TABS: 1.0000 | ORAL_TABLET | Freq: Two times a day (BID) | ORAL | 0 refills | Status: AC | PRN

## 2021-08-14 MED ORDER — FERROUS SULFATE 325 (65 FE) MG PO TABS: 325.0000 mg | ORAL_TABLET | Freq: Two times a day (BID) | ORAL | 3 refills | Status: AC

## 2021-08-14 MED ORDER — FUROSEMIDE 20 MG PO TABS: ORAL_TABLET | ORAL | 0 refills | Status: DC

## 2021-08-14 MED ORDER — FUROSEMIDE 40 MG PO TABS
40.0000 mg | ORAL_TABLET | Freq: Every day | ORAL | Status: DC
  Administered 2021-08-14 – 2021-08-16 (×3): 40 mg via ORAL
  Filled 2021-08-14 (×3): qty 1

## 2021-08-14 MED ORDER — ASPIRIN 81 MG PO TBEC: 81.0000 mg | DELAYED_RELEASE_TABLET | Freq: Every day | ORAL | 0 refills | Status: DC

## 2021-08-14 MED ORDER — POLYETHYLENE GLYCOL 3350 17 GM/SCOOP PO POWD: 17.0000 g | Freq: Two times a day (BID) | ORAL | 0 refills | Status: AC | PRN

## 2021-08-14 MED ORDER — ALPRAZOLAM 0.25 MG PO TABS: 0.2500 mg | ORAL_TABLET | Freq: Two times a day (BID) | ORAL | 0 refills | Status: DC | PRN

## 2021-08-14 MED ORDER — POTASSIUM CHLORIDE CRYS ER 20 MEQ PO TBCR
40.0000 meq | EXTENDED_RELEASE_TABLET | ORAL | Status: AC
  Administered 2021-08-14 (×2): 40 meq via ORAL
  Filled 2021-08-14 (×2): qty 2

## 2021-08-14 NOTE — Progress Notes (Signed)
Mobility Specialist Progress Note:   08/14/21 1530  Mobility  Activity Transferred to/from [facility]  Level of Assistance +2 (takes two people)  Assistive Device BSC;Front wheel walker  Distance Ambulated (ft) 2 ft  Mobility Out of bed for toileting  Mobility Response Tolerated well  Mobility performed by Mobility specialist  $Mobility charge 1 Mobility   Pt requesting to use BR. Transferred to [facility] with modA +2. Pt unable to fully stand up, despite max cues.   Nelta Numbers Mobility Specialist  Phone [phone]

## 2021-08-14 NOTE — TOC Progression Note (Signed)
Transition of Care [facility]) - Progression Note    Patient Details  Name: Cassandra Wu MRN: [id number] Date of Birth: [birth year]  Transition [facility]) CM/SW Contact  Emeterio Reeve, [place] Phone Number: 08/14/2021, 4:24 PM  Clinical Narrative:     CSW spoke to pts daughter. Daughter chose [facility] for SNF. CSW is confirming that [facility] can accept pt at DC.   Expected Discharge Plan: [place] Barriers to Discharge: Continued Medical Work up, Ship broker  Expected Discharge Plan and Services Expected Discharge Plan: [place] arrangements for the past 2 months: Single Family Home Expected Discharge Date: 08/14/21                                     Social Determinants of Health (SDOH) Interventions    Readmission Risk Interventions No flowsheet data found.  Emeterio Reeve, LCSW Clinical Social Worker

## 2021-08-14 NOTE — Discharge Summary (Addendum)
Physician Discharge Summary  Cassandra Wu:[id number] DOB: 30-Jul-1925 DOA: 08/09/2021  PCP: Joseph Art, MD  Admit date: 08/09/2021 Discharge date: 08/14/2021 Admitted From: Home Disposition: SNF Recommendations for Outpatient Follow-up:  Follow ups as below. Please obtain CBC/BMP/Mag at follow up Please follow up on the following pending results: None  Discharge Condition: Stable CODE STATUS: Full code  Hospital Course: 88 year old F with PMH of HTN, breast cancer s/p mastectomy, diastolic CHF and anemia presenting with shortness of breath, bilateral lower extremity edema and melena, and admitted for iron deficiency anemia in the setting of possible upper GI bleed, and acute on chronic diastolic CHF.  Hemoglobin 8.1 (13.2 in 2020).  Anemia panel with severe iron deficiency.  She received IV iron infusion.  Did not require blood transfusion as H&H remained relatively stable.  EGD with esophagitis and 2 nonbleeding gastric ulcers.  Biopsy taken.  GI recommended p.o. iron twice daily and p.o. Protonix indefinitely.   In regards to CHF, she was diuresed with IV Lasix with resolution of BLE edema, and transition to p.o. Lasix.  Of note, she takes p.o. Lasix 20 mg as needed at home.  She is discharged on p.o. Lasix 40 mg daily for 4 days followed by 20 mg daily after that.   Therapy recommended SNF. See individual problem list below for more on hospital course.  Discharge Diagnoses:  Anemia severe iron deficiency anemia likely due to chronic blood loss from possible upper GI bleed: Hgb 13.2 in 2020.  We do not interval value until this admission. Recent Labs    08/09/21 1847 08/10/21 1146 08/10/21 2300 08/11/21 1146 08/12/21 0006 08/12/21 1154 08/13/21 0047 08/13/21 1338 08/14/21 0047 08/14/21 1127  HGB 8.1* 8.4* 7.5* 8.2* 8.3* 8.5* 7.8* 7.2* 7.6* 8.8*  -Continue p.o. iron twice daily -Bowel regimen as needed for constipation  Esophagitis/nonbleeding gastric  ulcer -Continue p.o. Protonix 40 mg daily -Follow-up pathology from EGD  Acute on chronic diastolic CHF: TTE with LVEF of 60 to 65% and G1 DD.  Diuresed with IV Lasix with significant improvement.  Discharged on p.o. Lasix 40 mg daily for 4 days followed by 20 mg daily after that. -Reassess fluid status and renal function in 1 week  Essential hypertension: -Continue home metoprolol -Lasix as above -Discontinue lisinopril and valsartan HCTZ due to hyponatremia  Hyponatremia: Likely hypervolemic.  HCTZ/ACEI/ARB could contribute. -Discontinued HCTZ/ACEI/ARB  -Continue Toprol-XL -Lasix as above.  Hypokalemia: K3.4.  Received p.o. KCl 40x2 prior to discharge   Physical deconditioning -Continue PT/OT at SNF.  Body mass index is 26.75 kg/m.           Discharge Exam: Vitals:   08/13/21 1617 08/13/21 2023 08/14/21 0503 08/14/21 0750  BP: 126/68 (!) 147/78 140/69 (!) 149/81  Pulse: 90 88 80 80  Temp: 98.3 F (36.8 C) 99.8 F (37.7 C) 98 F (36.7 C) 98.4 F (36.9 C)  Resp: 18 16 16 17   Height:      Weight:      SpO2: 100% 96% 96% 95%  TempSrc: Oral Oral Oral Oral  BMI (Calculated):         GENERAL: No apparent distress.  Nontoxic. HEENT: MMM.  Vision and hearing grossly intact.  NECK: Supple.  No apparent JVD.  RESP: 95% on RA.  No IWOB.  Fair aeration bilaterally. CVS:  RRR. Heart sounds normal.  ABD/GI/GU: Bowel sounds present. Soft. Non tender.  MSK/EXT:  Moves extremities. No apparent deformity. No edema.  SKIN: no apparent skin lesion  or wound NEURO: Awake and alert.  Oriented appropriately.  No apparent focal neuro deficit. PSYCH: Calm. Normal affect.   Discharge Instructions  Discharge Instructions     (HEART FAILURE PATIENTS) Call MD:  Anytime you have any of the following symptoms: 1) 3 pound weight gain in 24 hours or 5 pounds in 1 week 2) shortness of breath, with or without a dry hacking cough 3) swelling in the hands, feet or stomach 4) if you have to  sleep on extra pillows at night in order to breathe.   Complete by: As directed    Call MD for:  difficulty breathing, headache or visual disturbances   Complete by: As directed    Call MD for:  extreme fatigue   Complete by: As directed    Call MD for:  persistant dizziness or light-headedness   Complete by: As directed    Diet - low sodium heart healthy   Complete by: As directed    Increase activity slowly   Complete by: As directed       Allergies as of 08/14/2021       Reactions   Penicillins    Propranolol Hcl         Medication List     STOP taking these medications    diclofenac 75 MG EC tablet Commonly known as: VOLTAREN   hydrOXYzine 10 MG tablet Commonly known as: ATARAX   lisinopril 10 MG tablet Commonly known as: ZESTRIL   valsartan-hydrochlorothiazide 320-25 MG tablet Commonly known as: Diovan HCT       TAKE these medications    ALPRAZolam 0.25 MG tablet Commonly known as: XANAX Take 1 tablet (0.25 mg total) by mouth 2 (two) times daily as needed for anxiety.   B-12 PO Take 1 tablet by mouth daily.   D3 PO Take 1 capsule by mouth daily.   ferrous sulfate 325 (65 FE) MG tablet Take 1 tablet (325 mg total) by mouth 2 (two) times daily with a meal.   furosemide 20 MG tablet Commonly known as: LASIX Take 2 tablets (40 mg total) by mouth daily for 3 days, THEN 1 tablet (20 mg total) daily for 27 days. Start taking on: August 15, 2021 What changed: See the new instructions.   ICAPS AREDS 2 PO Take 1 capsule by mouth daily. What changed: Another medication with the same name was removed. Continue taking this medication, and follow the directions you see here.   metoprolol succinate 25 MG 24 hr tablet Commonly known as: TOPROL-XL Take 25 mg by mouth daily.   pantoprazole 40 MG tablet Commonly known as: PROTONIX Take 1 tablet (40 mg total) by mouth daily at 6 (six) AM. Start taking on: August 15, 2021   polyethylene glycol powder 17  GM/SCOOP powder Commonly known as: MiraLax Take 17 g by mouth 2 (two) times daily as needed for moderate constipation.   potassium chloride 10 MEQ tablet Commonly known as: KLOR-CON Take 10 mEq by mouth daily as needed. Take 93mEq daily as needed when taking a dose of lasix   senna-docusate 8.6-50 MG tablet Commonly known as: Senokot-S Take 1 tablet by mouth 2 (two) times daily between meals as needed for mild constipation. What changed: when to take this        Consultations: Gastroenterology  Procedures/Studies: EGD on 08/13/2021 1. GERD with large caliber esophageal stricture. 2. Gastric ulcers and erosions without stigmata. Likely cause for dark stools and iron deficiency anemia 3. Otherwise unremarkable EGD. Recommendation 1.  Resume previous diet 2. Pantoprazole 40 mg daily indefinitely 3. Follow-up biopsies (we will) 4. Minimize NSAIDs 5. Solid iron sulfate 325 mg twice daily with meals and 6. PCP should monitor blood counts as outpatient to assess for adequate response to iron replacement  7. Please reach out for problems or questions. Outpatient GI follow-up required at this time. GI will sign off.   DG Chest 2 View  Result Date: 08/09/2021 CLINICAL DATA:  Reason for exam: sob The pt is c/o some sob bi-lateral leg swelling and hx of chf EXAM: CHEST - 2 VIEW COMPARISON:  None. FINDINGS: Cardiac silhouette mildly enlarged. No mediastinal or hilar masses or evidence of adenopathy. Prominent aortic arch. Clear lungs.  No pleural effusion or pneumothorax. Bilateral advanced glenohumeral joint arthropathic changes. IMPRESSION: 1. No acute cardiopulmonary disease. Electronically Signed   By: Lajean Manes M.D.   On: 08/09/2021 19:53   ECHOCARDIOGRAM COMPLETE  Result Date: 08/12/2021    ECHOCARDIOGRAM REPORT   Patient Name:   Cassandra Wu Istre Date of Exam: 08/12/2021 Medical Rec #:  [id number]             Height:       64.0 in Accession #:    [id number]            Weight:        155.9 lb Date of Birth:  Apr 15, 1925            BSA:          1.760 m Patient Age:    64 years              BP:           138/80 mmHg Patient Gender: F                     HR:           91 bpm. Exam Location:  Inpatient Procedure: 2D Echo, Cardiac Doppler and Color Doppler Indications:    Dyspnea  History:        Patient has no prior history of Echocardiogram examinations.                 Risk Factors:Hypertension.  Sonographer:    Bernadene Person RDCS Referring Phys: [id number] [place]  1. Left ventricular ejection fraction, by estimation, is 60 to 65%. The left ventricle has normal function. The left ventricle has no regional wall motion abnormalities. There is mild left ventricular hypertrophy. Left ventricular diastolic parameters are consistent with Grade I diastolic dysfunction (impaired relaxation). Elevated left atrial pressure.  2. Right ventricular systolic function is normal. The right ventricular size is normal. There is normal pulmonary artery systolic pressure.  3. Left atrial size was mildly dilated.  4. The mitral valve is normal in structure. No evidence of mitral valve regurgitation. No evidence of mitral stenosis.  5. The aortic valve is tricuspid. There is mild calcification of the aortic valve. There is mild thickening of the aortic valve. Aortic valve regurgitation is not visualized. No aortic stenosis is present.  6. The inferior vena cava is normal in size with <50% respiratory variability, suggesting right atrial pressure of 8 mmHg. FINDINGS  Left Ventricle: Left ventricular ejection fraction, by estimation, is 60 to 65%. The left ventricle has normal function. The left ventricle has no regional wall motion abnormalities. The left ventricular internal cavity size was normal in size. There is  mild left ventricular hypertrophy. Left ventricular diastolic parameters  are consistent with Grade I diastolic dysfunction (impaired relaxation). Elevated left atrial pressure.  Right Ventricle: The right ventricular size is normal. No increase in right ventricular wall thickness. Right ventricular systolic function is normal. There is normal pulmonary artery systolic pressure. The tricuspid regurgitant velocity is 2.27 m/s, and  with an assumed right atrial pressure of 8 mmHg, the estimated right ventricular systolic pressure is 86.7 mmHg. Left Atrium: Left atrial size was mildly dilated. Right Atrium: Right atrial size was normal in size. Pericardium: There is no evidence of pericardial effusion. Mitral Valve: The mitral valve is normal in structure. No evidence of mitral valve regurgitation. No evidence of mitral valve stenosis. Tricuspid Valve: The tricuspid valve is normal in structure. Tricuspid valve regurgitation is mild . No evidence of tricuspid stenosis. Aortic Valve: The aortic valve is tricuspid. There is mild calcification of the aortic valve. There is mild thickening of the aortic valve. There is mild aortic valve annular calcification. Aortic valve regurgitation is not visualized. No aortic stenosis  is present. Aortic valve mean gradient measures 4.1 mmHg. Aortic valve peak gradient measures 9.7 mmHg. Aortic valve area, by VTI measures 2.81 cm. Pulmonic Valve: The pulmonic valve was not well visualized. Pulmonic valve regurgitation is not visualized. No evidence of pulmonic stenosis. Aorta: The aortic root is normal in size and structure. Venous: The inferior vena cava is normal in size with less than 50% respiratory variability, suggesting right atrial pressure of 8 mmHg. IAS/Shunts: The interatrial septum was not well visualized.  LEFT VENTRICLE PLAX 2D LVIDd:         3.60 cm   Diastology LVIDs:         2.50 cm   LV e' medial:    4.68 cm/s LV PW:         1.10 cm   LV E/e' medial:  18.6 LV IVS:        1.10 cm   LV e' lateral:   7.01 cm/s LVOT diam:     2.00 cm   LV E/e' lateral: 12.4 LV SV:         75 LV SV Index:   43 LVOT Area:     3.14 cm  RIGHT VENTRICLE RV S prime:      11.70 cm/s TAPSE (M-mode): 1.9 cm LEFT ATRIUM             Index        RIGHT ATRIUM           Index LA diam:        3.70 cm 2.10 cm/m   RA Area:     12.60 cm LA Vol (A2C):   53.5 ml 30.40 ml/m  RA Volume:   27.40 ml  15.57 ml/m LA Vol (A4C):   60.3 ml 34.27 ml/m LA Biplane Vol: 59.2 ml 33.64 ml/m  AORTIC VALVE AV Area (Vmax):    2.77 cm AV Area (Vmean):   3.10 cm AV Area (VTI):     2.81 cm AV Vmax:           155.66 cm/s AV Vmean:          91.335 cm/s AV VTI:            0.267 m AV Peak Grad:      9.7 mmHg AV Mean Grad:      4.1 mmHg LVOT Vmax:         137.00 cm/s LVOT Vmean:        90.000 cm/s LVOT  VTI:          0.239 m LVOT/AV VTI ratio: 0.90  AORTA Ao Root diam: 3.40 cm Ao Asc diam:  3.30 cm MITRAL VALVE                TRICUSPID VALVE MV Area (PHT): 3.91 cm     TR Peak grad:   20.6 mmHg MV Decel Time: 194 msec     TR Vmax:        227.00 cm/s MV E velocity: 86.90 cm/s MV A velocity: 124.00 cm/s  SHUNTS MV E/A ratio:  0.70         Systemic VTI:  0.24 m                             Systemic Diam: 2.00 cm Carlyle Dolly MD Electronically signed by Carlyle Dolly MD Signature Date/Time: 08/12/2021/11:05:39 AM    Final        The results of significant diagnostics from this hospitalization (including imaging, microbiology, ancillary and laboratory) are listed below for reference.     Microbiology: Recent Results (from the past 240 hour(s))  Resp Panel by RT-PCR (Flu A&B, Covid) Nasopharyngeal Swab     Status: None   Collection Time: 08/10/21  4:09 AM   Specimen: Nasopharyngeal Swab; Nasopharyngeal(NP) swabs in vial transport medium  Result Value Ref Range Status   SARS Coronavirus 2 by RT PCR NEGATIVE NEGATIVE Final    Comment: (NOTE) SARS-CoV-2 target nucleic acids are NOT DETECTED.  The SARS-CoV-2 RNA is generally detectable in upper respiratory specimens during the acute phase of infection. The lowest concentration of SARS-CoV-2 viral copies this assay can detect is 138 copies/mL. A  negative result does not preclude SARS-Cov-2 infection and should not be used as the sole basis for treatment or other patient management decisions. A negative result may occur with  improper specimen collection/handling, submission of specimen other than nasopharyngeal swab, presence of viral mutation(s) within the areas targeted by this assay, and inadequate number of viral copies(<138 copies/mL). A negative result must be combined with clinical observations, patient history, and epidemiological information. The expected result is Negative.  Fact Sheet for Patients:  EntrepreneurPulse.com.au  Fact Sheet for Healthcare Providers:  IncredibleEmployment.be  This test is no t yet approved or cleared by the [place] FDA and  has been authorized for detection and/or diagnosis of SARS-CoV-2 by FDA under an Emergency Use Authorization (EUA). This EUA will remain  in effect (meaning this test can be used) for the duration of the COVID-19 declaration under Section 564(b)(1) of the Act, 21 U.S.C.section 360bbb-3(b)(1), unless the authorization is terminated  or revoked sooner.       Influenza A by PCR NEGATIVE NEGATIVE Final   Influenza B by PCR NEGATIVE NEGATIVE Final    Comment: (NOTE) The Xpert Xpress SARS-CoV-2/FLU/RSV plus assay is intended as an aid in the diagnosis of influenza from Nasopharyngeal swab specimens and should not be used as a sole basis for treatment. Nasal washings and aspirates are unacceptable for Xpert Xpress SARS-CoV-2/FLU/RSV testing.  Fact Sheet for Patients: EntrepreneurPulse.com.au  Fact Sheet for Healthcare Providers: IncredibleEmployment.be  This test is not yet approved or cleared by the [place] FDA and has been authorized for detection and/or diagnosis of SARS-CoV-2 by FDA under an Emergency Use Authorization (EUA). This EUA will remain in effect (meaning this test can  be used) for the duration of the COVID-19 declaration under Section 564(b)(1) of  the Act, 21 U.S.C. section 360bbb-3(b)(1), unless the authorization is terminated or revoked.  Performed at [facility], [place] [address], [place], [place] [address]      Labs:  CBC: Recent Labs  Lab 08/09/21 1847 08/10/21 1146 08/10/21 2300 08/12/21 1154 08/13/21 0047 08/13/21 1338 08/14/21 0047 08/14/21 1127  WBC 3.1* 2.6*  --   --   --   --   --   --   HGB 8.1* 8.4*   < > 8.5* 7.8* 7.2* 7.6* 8.8*  HCT 26.8* 26.1*   < > 26.6* 23.4* 21.7* 24.0* 28.0*  MCV 81.5 79.3*  --   --   --   --   --   --   PLT 193 194  --   --   --   --   --   --    < > = values in this interval not displayed.   BMP &GFR Recent Labs  Lab 08/09/21 1847 08/11/21 0020 08/12/21 0006 08/13/21 0047 08/14/21 0047  NA 131* 135 132* 127* 130*  K 4.3 4.2 3.5 3.4* 3.4*  CL 101 102 96* 92* 94*  CO2 24 26 28 27 28   GLUCOSE 100* 108* 127* 114* 113*  BUN 15 14 16 14 14   CREATININE 0.64 0.72 0.66 0.64 0.63  CALCIUM 9.2 9.0 9.0 8.6* 8.7*  MG  --   --   --  1.7 1.8   Estimated Creatinine Clearance: 41.5 mL/min (by C-G formula based on SCr of 0.63 mg/dL). Liver & Pancreas: No results for input(s): AST, ALT, ALKPHOS, BILITOT, PROT, ALBUMIN in the last 168 hours. No results for input(s): LIPASE, AMYLASE in the last 168 hours. No results for input(s): AMMONIA in the last 168 hours. Diabetic: No results for input(s): HGBA1C in the last 72 hours. No results for input(s): GLUCAP in the last 168 hours. Cardiac Enzymes: No results for input(s): CKTOTAL, CKMB, CKMBINDEX, TROPONINI in the last 168 hours. No results for input(s): PROBNP in the last 8760 hours. Coagulation Profile: No results for input(s): INR, PROTIME in the last 168 hours. Thyroid Function Tests: No results for input(s): TSH, T4TOTAL, FREET4, T3FREE, THYROIDAB in the last 72 hours. Lipid Profile: No results for input(s): CHOL, HDL, LDLCALC, TRIG, CHOLHDL,  LDLDIRECT in the last 72 hours. Anemia Panel: Recent Labs    08/12/21 1154  VITAMINB12 473  FERRITIN 14  TIBC 315  IRON 14*   Urine analysis:    Component Value Date/Time   LABSPEC 1.020 03/11/2019 1835   PHURINE 6.5 03/11/2019 1835   GLUCOSEU NEGATIVE 03/11/2019 1835   HGBUR NEGATIVE 03/11/2019 1835   BILIRUBINUR NEGATIVE 03/11/2019 1835   KETONESUR NEGATIVE 03/11/2019 1835   PROTEINUR NEGATIVE 03/11/2019 1835   UROBILINOGEN 0.2 03/11/2019 1835   NITRITE NEGATIVE 03/11/2019 1835   LEUKOCYTESUR NEGATIVE 03/11/2019 1835   Sepsis Labs: Invalid input(s): PROCALCITONIN, LACTICIDVEN   Time coordinating discharge: 55 minutes  SIGNED:  Mercy Riding, MD  [facility] 08/14/2021, 12:40 PM

## 2021-08-14 NOTE — Progress Notes (Signed)
Heart Failure Navigator Progress Note  Assessed for Heart & Vascular TOC clinic readiness.  Patient does not meet criteria due to lives in [place], [place] for SNF in [place] area.   Navigator available for reassessment of patient.   Pricilla Holm, MSN, RN Heart Failure Nurse Navigator [phone]

## 2021-08-15 LAB — SURGICAL PATHOLOGY

## 2021-08-15 LAB — MAGNESIUM: Magnesium: 1.9 mg/dL (ref 1.7–2.4)

## 2021-08-15 LAB — RENAL FUNCTION PANEL
Albumin: 2.6 g/dL — ABNORMAL LOW (ref 3.5–5.0)
Anion gap: 5 (ref 5–15)
BUN: 17 mg/dL (ref 8–23)
CO2: 27 mmol/L (ref 22–32)
Calcium: 9.2 mg/dL (ref 8.9–10.3)
Chloride: 93 mmol/L — ABNORMAL LOW (ref 98–111)
Creatinine, Ser: 0.59 mg/dL (ref 0.44–1.00)
GFR, Estimated: >60 mL/min (ref >60)
Glucose, Bld: 109 mg/dL — ABNORMAL HIGH (ref 70–99)
Phosphorus: 3.1 mg/dL (ref 2.5–4.6)
Potassium: 4.3 mmol/L (ref 3.5–5.1)
Sodium: 125 mmol/L — ABNORMAL LOW (ref 135–145)

## 2021-08-15 LAB — RESP PANEL BY RT-PCR (FLU A&B, COVID) ARPGX2
Influenza A by PCR: NEGATIVE
Influenza B by PCR: NEGATIVE
SARS Coronavirus 2 by RT PCR: NEGATIVE

## 2021-08-15 LAB — OSMOLALITY, URINE: Osmolality, Ur: 334 mOsm/kg (ref 300–900)

## 2021-08-15 LAB — SODIUM
Sodium: 131 mmol/L — ABNORMAL LOW (ref 135–145)
Sodium: 129 mmol/L — ABNORMAL LOW (ref 135–145)

## 2021-08-15 LAB — SODIUM, URINE, RANDOM: Sodium, Ur: 41 mmol/L

## 2021-08-15 MED ORDER — SODIUM CHLORIDE 0.9 % IV SOLN
250.0000 mg | Freq: Once | INTRAVENOUS | Status: AC
  Administered 2021-08-15: 250 mg via INTRAVENOUS
  Filled 2021-08-15: qty 20

## 2021-08-15 MED ORDER — MUSCLE RUB 10-15 % EX CREA
TOPICAL_CREAM | CUTANEOUS | Status: DC | PRN
  Administered 2021-08-15: 1 via TOPICAL
  Filled 2021-08-15: qty 85

## 2021-08-15 NOTE — TOC Progression Note (Signed)
Transition of Care [facility]) - Progression Note    Patient Details  Name: Cassandra Wu MRN: [id number] Date of Birth: November 08, 1924  [facility] CM/SW Contact  Emeterio Reeve, [place] Phone Number: 08/15/2021, 12:43 PM  Clinical Narrative:     CSW spoke with Admissions coordinator and they are able to offer pt a bed at DC. [facility] is able to get a private room. MD anticipates DC tomorrow. CSW requested covid test.    Expected Discharge Plan: [place] Barriers to Discharge: Continued Medical Work up, Ship broker  Expected Discharge Plan and Services Expected Discharge Plan: [place] arrangements for the past 2 months: Single Family Home Expected Discharge Date: 08/14/21                                     Social Determinants of Health (SDOH) Interventions    Readmission Risk Interventions No flowsheet data found.  Emeterio Reeve, LCSW Clinical Social Worker

## 2021-08-15 NOTE — Progress Notes (Signed)
PROGRESS NOTE  Cassandra Wu NOB:[id number] DOB: November 16, 1924   PCP: Joseph Art, MD  Patient is from: Home  DOA: 08/09/2021 LOS: 5  Chief complaints:  Chief Complaint  Patient presents with   Shortness of Breath     Brief Narrative / Interim history: 88 year old F with PMH of HTN, breast cancer s/p mastectomy, diastolic CHF and anemia presenting with shortness of breath, bilateral lower extremity edema and melena, and admitted for iron deficiency anemia in the setting of possible upper GI bleed, and acute on chronic diastolic CHF.  Hemoglobin 8.1 (13.2 in 2020).  Anemia panel with severe iron deficiency.  She received IV iron infusion.  Did not require blood transfusion as H&H remained relatively stable.  EGD with esophagitis and 2 nonbleeding gastric ulcers.  Biopsy taken.  GI recommended p.o. iron twice daily and p.o. Protonix indefinitely.    In regards to CHF, she was diuresed with IV Lasix with resolution of BLE edema, and transition to p.o. Lasix. Of note, she takes p.o. Lasix 20 mg as needed at home.    Patient will be discharged to SNF once hyponatremia improves.  Subjective: Seen and examined earlier this morning.  No major events overnight of this morning.  She says she could not sleep well.  She was uncomfortable on hospital bed.  No other complaints or concerns.  Objective: Vitals:   08/14/21 1942 08/15/21 0246 08/15/21 0520 08/15/21 0824  BP: 133/74  139/81 (!) 156/92  Pulse: 91  79 86  Resp: 15  16 18   Temp: 98.9 F (37.2 C)  98 F (36.7 C) 98.1 F (36.7 C)  TempSrc: Oral   Oral  SpO2: 97%  98% 99%  Weight:  70.6 kg    Height:        Examination:  GENERAL: No apparent distress.  Nontoxic. HEENT: MMM.  Vision and hearing grossly intact.  NECK: Supple.  No apparent JVD.  RESP:  No IWOB.  Fair aeration bilaterally. CVS:  RRR. Heart sounds normal.  ABD/GI/GU: BS+. Abd soft, NTND.  MSK/EXT:  Moves extremities. No apparent deformity.  1+ BLE  edema. SKIN: no apparent skin lesion or wound NEURO: Awake, alert and oriented appropriately.  No apparent focal neuro deficit. PSYCH: Calm. Normal affect.   Procedures:  08/13/2021-EGD showed GERD with large caliber esophageal stricture and gastric ulcers and erosions without stigmata  Microbiology summarized: COVID-19 and influenza PCR nonreactive.  Assessment & Plan: Anemia severe iron deficiency anemia likely due to chronic blood loss from possible upper GI bleed: Hgb 13.2 in 2020.  We do not interval value until this admission. Recent Labs    08/09/21 1847 08/10/21 1146 08/10/21 2300 08/11/21 1146 08/12/21 0006 08/12/21 1154 08/13/21 0047 08/13/21 1338 08/14/21 0047 08/14/21 1127  HGB 8.1* 8.4* 7.5* 8.2* 8.3* 8.5* 7.8* 7.2* 7.6* 8.8*  -Give additional IV iron today. -Continue p.o. iron twice daily -Bowel regimen as needed for constipation   Esophagitis/nonbleeding gastric ulcer -Continue p.o. Protonix 40 mg daily -Follow-up pathology from EGD   Acute on chronic diastolic CHF: TTE with LVEF of 60 to 65% and G1 DD.  Diuresed with IV Lasix with significant improvement.  Transition to p.o. Lasix.  She had 1.1 L UOP/24 hours. -Continue p.o. Lasix 40 mg daily -Monitor fluid and respiratory status -Sodium and fluid restriction.   Hyponatremia: Likely hypervolemic.   Recent Labs  Lab 08/09/21 1748 08/09/21 1847 08/11/21 0020 08/12/21 0006 08/13/21 0047 08/14/21 0047 08/15/21 0212  NA 137 131* 135 132* 127*  130* 125*  -Check urine sodium and osmolality -HCTZ/ACEI/ARB discontinued on admission -Lasix as above. -A.m. cortisol -Recheck sodium in the afternoon -May consider p.o. sodium chloride if urine chemistry consistent with SIADH   Essential hypertension: -Continue home metoprolol -Lasix as above -Discontinue lisinopril and valsartan HCTZ due to hyponatremia  Hypokalemia: Resolved.     Physical deconditioning -Continue therapy  Body mass index is 26.72  kg/m.         DVT prophylaxis:  Place and maintain sequential compression device Start: 08/11/21 1226  Code Status: Full code Family Communication: Patient and/or RN. Available if any question.  Level of care: Telemetry Medical Status is: Inpatient  Remains inpatient appropriate because: Due to significant electrolyte abnormalities/hyponatremia       Consultants:  Gastroenterology   Sch Meds:  Scheduled Meds:  ALPRAZolam  0.25 mg Oral BID   aspirin EC  81 mg Oral Daily   ferrous sulfate  325 mg Oral BID WC   furosemide  40 mg Oral Daily   pantoprazole  40 mg Oral Q0600   polyethylene glycol  17 g Oral BID   senna-docusate  1 tablet Oral BID   sodium chloride flush  3 mL Intravenous Q12H   Continuous Infusions:  sodium chloride 10 mL/hr at 08/13/21 1148   PRN Meds:.sodium chloride, acetaminophen **OR** acetaminophen, guaiFENesin-dextromethorphan, ondansetron **OR** ondansetron (ZOFRAN) IV, sodium chloride flush  Antimicrobials: Anti-infectives (From admission, onward)    None        I have personally reviewed the following labs and images: CBC: Recent Labs  Lab 08/09/21 1847 08/10/21 1146 08/10/21 2300 08/12/21 1154 08/13/21 0047 08/13/21 1338 08/14/21 0047 08/14/21 1127  WBC 3.1* 2.6*  --   --   --   --   --   --   HGB 8.1* 8.4*   < > 8.5* 7.8* 7.2* 7.6* 8.8*  HCT 26.8* 26.1*   < > 26.6* 23.4* 21.7* 24.0* 28.0*  MCV 81.5 79.3*  --   --   --   --   --   --   PLT 193 194  --   --   --   --   --   --    < > = values in this interval not displayed.   BMP &GFR Recent Labs  Lab 08/11/21 0020 08/12/21 0006 08/13/21 0047 08/14/21 0047 08/15/21 0212  NA 135 132* 127* 130* 125*  K 4.2 3.5 3.4* 3.4* 4.3  CL 102 96* 92* 94* 93*  CO2 26 28 27 28 27   GLUCOSE 108* 127* 114* 113* 109*  BUN 14 16 14 14 17   CREATININE 0.72 0.66 0.64 0.63 0.59  CALCIUM 9.0 9.0 8.6* 8.7* 9.2  MG  --   --  1.7 1.8 1.9  PHOS  --   --   --   --  3.1   Estimated  Creatinine Clearance: 41.5 mL/min (by C-G formula based on SCr of 0.59 mg/dL). Liver & Pancreas: Recent Labs  Lab 08/15/21 0212  ALBUMIN 2.6*   No results for input(s): LIPASE, AMYLASE in the last 168 hours. No results for input(s): AMMONIA in the last 168 hours. Diabetic: No results for input(s): HGBA1C in the last 72 hours. No results for input(s): GLUCAP in the last 168 hours. Cardiac Enzymes: No results for input(s): CKTOTAL, CKMB, CKMBINDEX, TROPONINI in the last 168 hours. No results for input(s): PROBNP in the last 8760 hours. Coagulation Profile: No results for input(s): INR, PROTIME in the last 168 hours. Thyroid Function  Tests: No results for input(s): TSH, T4TOTAL, FREET4, T3FREE, THYROIDAB in the last 72 hours. Lipid Profile: No results for input(s): CHOL, HDL, LDLCALC, TRIG, CHOLHDL, LDLDIRECT in the last 72 hours. Anemia Panel: No results for input(s): VITAMINB12, FOLATE, FERRITIN, TIBC, IRON, RETICCTPCT in the last 72 hours. Urine analysis:    Component Value Date/Time   LABSPEC 1.020 03/11/2019 1835   PHURINE 6.5 03/11/2019 1835   GLUCOSEU NEGATIVE 03/11/2019 1835   HGBUR NEGATIVE 03/11/2019 1835   BILIRUBINUR NEGATIVE 03/11/2019 1835   KETONESUR NEGATIVE 03/11/2019 1835   PROTEINUR NEGATIVE 03/11/2019 1835   UROBILINOGEN 0.2 03/11/2019 1835   NITRITE NEGATIVE 03/11/2019 1835   LEUKOCYTESUR NEGATIVE 03/11/2019 1835   Sepsis Labs: Invalid input(s): PROCALCITONIN, [place]  Microbiology: Recent Results (from the past 240 hour(s))  Resp Panel by RT-PCR (Flu A&B, Covid) Nasopharyngeal Swab     Status: None   Collection Time: 08/10/21  4:09 AM   Specimen: Nasopharyngeal Swab; Nasopharyngeal(NP) swabs in vial transport medium  Result Value Ref Range Status   SARS Coronavirus 2 by RT PCR NEGATIVE NEGATIVE Final    Comment: (NOTE) SARS-CoV-2 target nucleic acids are NOT DETECTED.  The SARS-CoV-2 RNA is generally detectable in upper respiratory specimens  during the acute phase of infection. The lowest concentration of SARS-CoV-2 viral copies this assay can detect is 138 copies/mL. A negative result does not preclude SARS-Cov-2 infection and should not be used as the sole basis for treatment or other patient management decisions. A negative result may occur with  improper specimen collection/handling, submission of specimen other than nasopharyngeal swab, presence of viral mutation(s) within the areas targeted by this assay, and inadequate number of viral copies(<138 copies/mL). A negative result must be combined with clinical observations, patient history, and epidemiological information. The expected result is Negative.  Fact Sheet for Patients:  EntrepreneurPulse.com.au  Fact Sheet for Healthcare Providers:  IncredibleEmployment.be  This test is no t yet approved or cleared by the [place] FDA and  has been authorized for detection and/or diagnosis of SARS-CoV-2 by FDA under an Emergency Use Authorization (EUA). This EUA will remain  in effect (meaning this test can be used) for the duration of the COVID-19 declaration under Section 564(b)(1) of the Act, 21 U.S.C.section 360bbb-3(b)(1), unless the authorization is terminated  or revoked sooner.       Influenza A by PCR NEGATIVE NEGATIVE Final   Influenza B by PCR NEGATIVE NEGATIVE Final    Comment: (NOTE) The Xpert Xpress SARS-CoV-2/FLU/RSV plus assay is intended as an aid in the diagnosis of influenza from Nasopharyngeal swab specimens and should not be used as a sole basis for treatment. Nasal washings and aspirates are unacceptable for Xpert Xpress SARS-CoV-2/FLU/RSV testing.  Fact Sheet for Patients: EntrepreneurPulse.com.au  Fact Sheet for Healthcare Providers: IncredibleEmployment.be  This test is not yet approved or cleared by the [place] FDA and has been authorized for detection  and/or diagnosis of SARS-CoV-2 by FDA under an Emergency Use Authorization (EUA). This EUA will remain in effect (meaning this test can be used) for the duration of the COVID-19 declaration under Section 564(b)(1) of the Act, 21 U.S.C. section 360bbb-3(b)(1), unless the authorization is terminated or revoked.  Performed at [facility], [place] [address] [place], [place] [address]     Radiology Studies: No results found.    Lynda Capistran T. Fedora  If 7PM-7AM, please contact night-coverage [email or web address] 08/15/2021, 3:54 PM

## 2021-08-15 NOTE — Progress Notes (Signed)
Occupational Therapy Treatment Patient Details Name: Cassandra Wu MRN: [id number] DOB: 1924/12/21 Today's Date: 08/15/2021   History of present illness 88 y.o. female who presents for evaluation of SOB that is worse with exertion and swelling of legs, also found to have GI bleed. with medical history significant for HTN, breast cancer s/p bilateral mastectomy   OT comments  Pt progressing towards established OT goals and demonstrating increased activity tolerance compared to prior sessions. Pt performing bed mobility with Min A. Pt performing stand pivot to [facility] and then recliner with Mod A +2 and RW. Pt completing toilet hygiene with Mod A. Continue to recommend dc to SNF and will continue to follow acutely as admitted.    Recommendations for follow up therapy are one component of a multi-disciplinary discharge planning process, led by the attending physician.  Recommendations may be updated based on patient status, additional functional criteria and insurance authorization.    Follow Up Recommendations  Skilled nursing-short term rehab (<3 hours/day)    Assistance Recommended at Discharge Frequent or constant Supervision/Assistance  Patient can return home with the following  A lot of help with walking and/or transfers;A lot of help with bathing/dressing/bathroom   Equipment Recommendations  Other (comment) (TBD next venue)    Recommendations for Other Services      Precautions / Restrictions Precautions Precautions: Fall Precaution Comments: BLE edema and painful       Mobility Bed Mobility Overal bed mobility: Needs Assistance Bed Mobility: Supine to Sit     Supine to sit: Min assist;HOB elevated     General bed mobility comments: Min A to bring hips towards EOB    Transfers Overall transfer level: Needs assistance Equipment used: 2 person hand held assist Transfers: Sit to/from Stand;Bed to chair/wheelchair/BSC Sit to Stand: Mod assist;From elevated  surface Stand pivot transfers: Mod assist;+2 physical assistance         General transfer comment: Mod A for power up. Mod A +2 for pivot to BSC. Assistance for weight shift forward     Balance Overall balance assessment: Needs assistance Sitting-balance support: No upper extremity supported;Feet supported Sitting balance-Leahy Scale: Fair     Standing balance support: Bilateral upper extremity supported;During functional activity Standing balance-Leahy Scale: Poor Standing balance comment: assistance for weight shift forward                           ADL either performed or assessed with clinical judgement   ADL Overall ADL's : Needs assistance/impaired                     Lower Body Dressing: Maximal assistance;Bed level Lower Body Dressing Details (indicate cue type and reason): don socks Toilet Transfer: Moderate assistance;+2 for physical assistance;Stand-pivot;BSC/3in1;Rolling walker (2 wheels) Toilet Transfer Details (indicate cue type and reason): Pt performing stand pivot with Mod A +2. Assistance for weight shift forward Toileting- Clothing Manipulation and Hygiene: Moderate assistance;Sitting/lateral lean Toileting - Clothing Manipulation Details (indicate cue type and reason): Mod A for peri care after BM. Pt able to laterally lean to clean but needing assistance to make sure she is clean     Functional mobility during ADLs: Moderate assistance;Rolling walker (2 wheels) (stand pivot) General ADL Comments: Performing toileting with Mod A +2    Extremity/Trunk Assessment Upper Extremity Assessment Upper Extremity Assessment: Generalized weakness   Lower Extremity Assessment Lower Extremity Assessment: Defer to PT evaluation        Vision  Perception     Praxis      Cognition Arousal/Alertness: Awake/alert Behavior During Therapy: WFL for tasks assessed/performed Overall Cognitive Status: Within Functional Limits for tasks  assessed                                            Exercises     Shoulder Instructions       General Comments      Pertinent Vitals/ Pain       Pain Assessment: Faces Faces Pain Scale: Hurts even more Pain Location: R UE most painful, bilateral legs and arms; left side of neck Pain Descriptors / Indicators: Aching;Sore Pain Intervention(s): Monitored during session;Limited activity within patient's tolerance;Repositioned  Home Living                                          Prior Functioning/Environment              Frequency  Min 2X/week        Progress Toward Goals  OT Goals(current goals can now be found in the care plan section)  Progress towards OT goals: Progressing toward goals  Acute Rehab OT Goals OT Goal Formulation: With patient Time For Goal Achievement: 08/26/21 Potential to Achieve Goals: Good ADL Goals Pt Will Perform Eating: with set-up;with supervision;sitting (in bed or in recliner) Pt Will Perform Grooming: with set-up;with supervision;sitting (in bed or in recliner) Pt Will Transfer to Toilet: with min assist;ambulating;bedside commode (in bathroom) Pt Will Perform Toileting - Clothing Manipulation and hygiene:  (pt will be able to stand with min A to A with these tasks) Additional ADL Goal #1: Pt will able to roll left and right with min A to A with basic ADLs at a bed level and in prep for up OOB Additional ADL Goal #2: Pt will be min A to come up sit from sidelying to get to EOB  Plan Discharge plan remains appropriate    Co-evaluation                 AM-PAC OT "6 Clicks" Daily Activity     Outcome Measure   Help from another person eating meals?: A Little Help from another person taking care of personal grooming?: A Little Help from another person toileting, which includes using toliet, bedpan, or urinal?: A Lot Help from another person bathing (including washing, rinsing, drying)?: A  Lot Help from another person to put on and taking off regular upper body clothing?: A Lot Help from another person to put on and taking off regular lower body clothing?: Total 6 Click Score: 13    End of Session Equipment Utilized During Treatment: Gait belt;Rolling walker (2 wheels)  OT Visit Diagnosis: Other abnormalities of gait and mobility (R26.89);Muscle weakness (generalized) (M62.81);Pain Pain - Right/Left:  (both) Pain - part of body:  (arms, legs, left side of neck)   Activity Tolerance Patient tolerated treatment well   Patient Left in chair;with call bell/phone within reach   Nurse Communication Mobility status        Time: 5701-7793 OT Time Calculation (min): 30 min  Charges: OT General Charges $OT Visit: 1 Visit OT Treatments $Self Care/Home Management : 23-37 mins  [place], OTR/L Acute Rehab Pager: [phone] Office: [place]  Ephrem Carrick 08/15/2021, 5:10 PM

## 2021-08-16 LAB — CORTISOL-AM, BLOOD: Cortisol - AM: 14.2 ug/dL (ref 6.7–22.6)

## 2021-08-16 LAB — SODIUM
Sodium: 132 mmol/L — ABNORMAL LOW (ref 135–145)
Sodium: 129 mmol/L — ABNORMAL LOW (ref 135–145)

## 2021-08-16 LAB — RENAL FUNCTION PANEL
Albumin: 2.6 g/dL — ABNORMAL LOW (ref 3.5–5.0)
Anion gap: 8 (ref 5–15)
BUN: 16 mg/dL (ref 8–23)
CO2: 27 mmol/L (ref 22–32)
Calcium: 9 mg/dL (ref 8.9–10.3)
Chloride: 94 mmol/L — ABNORMAL LOW (ref 98–111)
Creatinine, Ser: 0.56 mg/dL (ref 0.44–1.00)
GFR, Estimated: >60 mL/min (ref >60)
Glucose, Bld: 103 mg/dL — ABNORMAL HIGH (ref 70–99)
Phosphorus: 3.5 mg/dL (ref 2.5–4.6)
Potassium: 4.2 mmol/L (ref 3.5–5.1)
Sodium: 129 mmol/L — ABNORMAL LOW (ref 135–145)

## 2021-08-16 LAB — MAGNESIUM: Magnesium: 1.9 mg/dL (ref 1.7–2.4)

## 2021-08-16 LAB — HEMOGLOBIN AND HEMATOCRIT, BLOOD
HCT: 25.8 % — ABNORMAL LOW (ref 36.0–46.0)
Hemoglobin: 8 g/dL — ABNORMAL LOW (ref 12.0–15.0)

## 2021-08-16 MED ORDER — VALSARTAN 40 MG PO TABS: 40.0000 mg | ORAL_TABLET | Freq: Every day | ORAL | 11 refills | Status: DC

## 2021-08-16 MED ORDER — FUROSEMIDE 20 MG PO TABS: ORAL_TABLET | ORAL | 0 refills | Status: DC

## 2021-08-16 NOTE — TOC Transition Note (Signed)
Transition of Care [facility]) - CM/SW Discharge Note   Patient Details  Name: Cassandra Wu MRN: [id number] Date of Birth: 04/16/1925  Transition of Care [facility]) CM/SW Contact:  Emeterio Reeve, LCSW Phone Number: 08/16/2021, 12:22 PM   Clinical Narrative:     Per MD patient ready for DC to [facility]. RN, patient, patient's family, and facility notified of DC. Discharge Summary and FL2 sent to facility. DC packet on chart. Insurance Josem Kaufmann has been received and pt is covid negative. Ambulance transport requested for patient.    RN to call report to [phone].  CSW will sign off for now as social work intervention is no longer needed. Please consult [place] again if new needs arise.   Final next level of care: Skilled Nursing Facility Barriers to Discharge: Barriers Resolved   Patient Goals and CMS Choice Patient states their goals for this hospitalization and ongoing recovery are:: to go to SNF CMS Medicare.gov Compare Post Acute Care list provided to:: Patient    Discharge Placement              Patient chooses bed at: Other - please specify in the comment section below: ([facility]) Patient to be transferred to facility by: Ptar Name of family member notified: Daughter- a. via Patient and family notified of of transfer: 08/16/21  Discharge Plan and Services                                     Social Determinants of Health (SDOH) Interventions     Readmission Risk Interventions No flowsheet data found.   Emeterio Reeve, LCSW Clinical Social Worker

## 2021-08-16 NOTE — NC FL2 (Signed)
[place] MEDICAID FL2 LEVEL OF CARE SCREENING TOOL     IDENTIFICATION  Patient Name: Cassandra Wu Birthdate: 15-Sep-1924 Sex: female Admission Date (Current Location): 08/09/2021  [facility] and [place] Number:  Herbalist and Address:  [facility], [place] [address] [place], [place] [address]      Provider Number: [id number]  Attending Physician Name and Address:  Mercy Riding, MD  Relative Name and Phone Number:       Current Level of Care: Hospital Recommended Level of Care: [place] Prior Approval Number:    Date Approved/Denied:   PASRR Number: [id number]  Discharge Plan: SNF    Current Diagnoses: Patient Active Problem List   Diagnosis Date Noted   Acute on chronic diastolic CHF (congestive heart failure) ([place]) 08/13/2021   Acute gastric ulcer with hemorrhage    Heme positive stool    GI bleed 08/10/2021   Anemia 08/10/2021   Dyspnea 08/10/2021   Overweight (BMI 25.0-29.9) 08/10/2021   CARPAL TUNNEL SYNDROME, RIGHT 07/16/2010   Essential hypertension 03/19/2010   TIA 03/19/2010   LYMPHEDEMA, LEFT ARM 03/19/2010   DEPENDENT EDEMA, LEGS, BILATERAL 03/19/2010   OTHER CHEST PAIN 03/19/2010   MASTECTOMY, BILATERAL, HX OF 03/19/2010    Orientation RESPIRATION BLADDER Height & Weight     Self, Time, Situation, Place  Normal Incontinent Weight: 155 lb 6.8 oz (70.5 kg) Height:  5\' 4"  (162.6 cm)  BEHAVIORAL SYMPTOMS/MOOD NEUROLOGICAL BOWEL NUTRITION STATUS      Incontinent Diet (See DC summary)  AMBULATORY STATUS COMMUNICATION OF NEEDS Skin   Extensive Assist Verbally Normal                       Personal Care Assistance Level of Assistance  Bathing, Feeding, Dressing Bathing Assistance: Maximum assistance Feeding assistance: Limited assistance Dressing Assistance: Maximum assistance     Functional Limitations Info  Sight, Hearing, Speech Sight Info: Adequate Hearing Info: Adequate Speech  Info: Adequate    SPECIAL CARE FACTORS FREQUENCY  PT (By licensed PT), OT (By licensed OT)     PT Frequency: 5x a week OT Frequency: 5x a week            Contractures Contractures Info: Not present    Additional Factors Info  Code Status, Allergies Code Status Info: Full Allergies Info: Penicillins   Propranolol Hcl           Current Medications (08/16/2021):  This is the current hospital active medication list Current Facility-Administered Medications  Medication Dose Route Frequency Provider Last Rate Last Admin   0.9 %  sodium chloride infusion  250 mL Intravenous PRN Irene Shipper, MD 10 mL/hr at 08/13/21 1148 Continued from Pre-op at 08/13/21 1148   acetaminophen (TYLENOL) tablet 650 mg  650 mg Oral Q6H PRN Irene Shipper, MD   650 mg at 08/15/21 2019   Or   acetaminophen (TYLENOL) suppository 650 mg  650 mg Rectal Q6H PRN Irene Shipper, MD       ALPRAZolam Duanne Moron) tablet 0.25 mg  0.25 mg Oral BID Irene Shipper, MD   0.25 mg at 08/16/21 0848   aspirin EC tablet 81 mg  81 mg Oral Daily Irene Shipper, MD   81 mg at 08/16/21 0848   ferrous sulfate tablet 325 mg  325 mg Oral BID WC Irene Shipper, MD   325 mg at 08/16/21 0848   furosemide (LASIX) tablet 40 mg  40 mg Oral Daily Wendee Beavers T, MD   40 mg at 08/16/21 0848   guaiFENesin-dextromethorphan (ROBITUSSIN DM) 100-10 MG/5ML syrup 5 mL  5 mL Oral Q4H PRN Sharen Hones, MD   5 mL at 08/15/21 0016   Muscle Rub CREA   Topical PRN Wendee Beavers T, MD   1 application at 31/59/45 2232   ondansetron (ZOFRAN) tablet 4 mg  4 mg Oral Q6H PRN Irene Shipper, MD       Or   ondansetron [facility]) injection 4 mg  4 mg Intravenous Q6H PRN Irene Shipper, MD       pantoprazole (PROTONIX) EC tablet 40 mg  40 mg Oral Q0600 Irene Shipper, MD   40 mg at 08/16/21 0534   polyethylene glycol (MIRALAX / GLYCOLAX) packet 17 g  17 g Oral BID Wendee Beavers T, MD   17 g at 08/16/21 0849   senna-docusate (Senokot-S) tablet 1 tablet  1 tablet Oral BID Wendee Beavers T, MD   1 tablet at 08/16/21 0848   sodium chloride flush (NS) 0.9 % injection 3 mL  3 mL Intravenous Q12H Irene Shipper, MD   3 mL at 08/16/21 0930   sodium chloride flush (NS) 0.9 % injection 3 mL  3 mL Intravenous PRN Irene Shipper, MD         Discharge Medications: Please see discharge summary for a list of discharge medications.  Relevant Imaging Results:  Relevant Lab Results:   Additional Information SSN: [id number]  Emeterio Reeve,

## 2021-08-16 NOTE — Plan of Care (Signed)
Problem: Education: Goal: Ability to demonstrate management of disease process will improve Outcome: Completed/Met Goal: Ability to verbalize understanding of medication therapies will improve Outcome: Completed/Met Goal: Individualized Educational Video(s) Outcome: Completed/Met   Problem: Activity: Goal: Capacity to carry out activities will improve Outcome: Completed/Met   Problem: Cardiac: Goal: Ability to achieve and maintain adequate cardiopulmonary perfusion will improve Outcome: Completed/Met   Problem: Education: Goal: Knowledge of General Education information will improve Description: Including pain rating scale, medication(s)/side effects and non-pharmacologic comfort measures Outcome: Completed/Met   Problem: Health Behavior/Discharge Planning: Goal: Ability to manage health-related needs will improve Outcome: Completed/Met   Problem: Clinical Measurements: Goal: Ability to maintain clinical measurements within normal limits will improve Outcome: Completed/Met Goal: Will remain free from infection Outcome: Completed/Met Goal: Diagnostic test results will improve Outcome: Completed/Met Goal: Respiratory complications will improve Outcome: Completed/Met Goal: Cardiovascular complication will be avoided Outcome: Completed/Met   Problem: Activity: Goal: Risk for activity intolerance will decrease Outcome: Completed/Met   Problem: Nutrition: Goal: Adequate nutrition will be maintained Outcome: Completed/Met   Problem: Coping: Goal: Level of anxiety will decrease Outcome: Completed/Met   Problem: Elimination: Goal: Will not experience complications related to bowel motility Outcome: Completed/Met Goal: Will not experience complications related to urinary retention Outcome: Completed/Met   Problem: Pain Managment: Goal: General experience of comfort will improve Outcome: Completed/Met   Problem: Safety: Goal: Ability to remain free from injury will  improve Outcome: Completed/Met   Problem: Skin Integrity: Goal: Risk for impaired skin integrity will decrease Outcome: Completed/Met   

## 2021-08-16 NOTE — Discharge Summary (Addendum)
Physician Discharge Summary  Cassandra Wu:[id number] DOB: [birth year] DOA: 08/09/2021  PCP: Joseph Art, MD  Admit date: 08/09/2021 Discharge date: 08/16/2021 Admitted From: Home Disposition: SNF Recommendations for Outpatient Follow-up:  Follow ups as below. Please obtain CBC/BMP/Mag at follow up Please follow up on the following pending results: None  Discharge Condition: Stable CODE STATUS: Full code  Hospital Course: 88 year old F with PMH of HTN, breast cancer s/p mastectomy, diastolic CHF and anemia presenting with shortness of breath, bilateral lower extremity edema and melena, and admitted for iron deficiency anemia in the setting of possible upper GI bleed, and acute on chronic diastolic CHF.  Hemoglobin 8.1 (13.2 in 2020).  Anemia panel with severe iron deficiency.  She received IV iron infusion.  Did not require blood transfusion as H&H remained relatively stable.  EGD with esophagitis and 2 nonbleeding gastric ulcers.  Biopsy taken.  Pathology with nonspecific reactive gastropathy.  H. pylori test negative.  GI recommended p.o. iron twice daily and p.o. Protonix indefinitely.   In regards to CHF, she was diuresed with IV Lasix with resolution of BLE edema, and transition to p.o. Lasix.  Of note, she takes p.o. Lasix 20 mg as needed at home.  She is discharged on p.o. Lasix 40 mg daily for 7 days followed by 20 mg daily after that.   Therapy recommended SNF. See individual problem list below for more on hospital course.  Discharge Diagnoses:  Anemia severe iron deficiency anemia likely due to chronic blood loss from possible upper GI bleed: Hgb 13.2 in 2020.  We do not interval value until this admission.  Received IV ferric gluconate 250 mg x 2. Recent Labs    08/10/21 1146 08/10/21 2300 08/11/21 1146 08/12/21 0006 08/12/21 1154 08/13/21 0047 08/13/21 1338 08/14/21 0047 08/14/21 1127 08/16/21 0521  HGB 8.4* 7.5* 8.2* 8.3* 8.5* 7.8* 7.2* 7.6* 8.8*  8.0*  -Continue p.o. iron twice daily -Bowel regimen as needed for constipation  Esophagitis/nonbleeding gastric ulcer -Continue p.o. Protonix 40 mg daily  Acute on chronic diastolic CHF: TTE with LVEF of 60 to 65% and G1 DD.  Diuresed with IV Lasix with significant improvement.  Discharged on p.o. Lasix 40 mg daily for 7 days followed by 20 mg daily after that. -Reassess fluid status and renal function in 1 week and adjust diuretics as appropriate -Sodium restriction to less than 2 g a day and fluid restriction to less than 1500 cc a day  Essential hypertension: -Continue home metoprolol -Lasix as above -Resumed valsartan at lower dose -Discontinue lisinopril and HCTZ due to hyponatremia.  Hyponatremia: Likely hypervolemic.  HCTZ/ACEI/ARB could contribute. -P.o. Lasix as above -Adjusted meds as above.  Hypokalemia: Resolved.   Physical deconditioning -Continue PT/OT at SNF.  Body mass index is 26.68 kg/m.           Discharge Exam: Vitals:   08/15/21 0824 08/15/21 2025 08/16/21 0454 08/16/21 0500  BP: (!) 156/92 (!) 151/88  (!) 155/76  Pulse: 86 95  89  Temp: 98.1 F (36.7 C) 98.6 F (37 C)  98.6 F (37 C)  Resp: 18 18  17   Height:      Weight:   70.5 kg   SpO2: 99% 100%  100%  TempSrc: Oral Oral  Oral  BMI (Calculated):   26.67      GENERAL: No apparent distress.  Nontoxic. HEENT: MMM.  Vision and hearing grossly intact.  NECK: Supple.  No apparent JVD.  RESP: 100% on RA.  No  IWOB.  Fair aeration bilaterally. CVS:  RRR. Heart sounds normal.  ABD/GI/GU: Bowel sounds present. Soft. Non tender.  MSK/EXT:  Moves extremities. No apparent deformity.  Trace BLE edema. SKIN: no apparent skin lesion or wound NEURO: Awake and alert.  Oriented appropriately.  No apparent focal neuro deficit. PSYCH: Calm. Normal affect.   Discharge Instructions  Discharge Instructions     (HEART FAILURE PATIENTS) Call MD:  Anytime you have any of the following symptoms: 1) 3  pound weight gain in 24 hours or 5 pounds in 1 week 2) shortness of breath, with or without a dry hacking cough 3) swelling in the hands, feet or stomach 4) if you have to sleep on extra pillows at night in order to breathe.   Complete by: As directed    Call MD for:  difficulty breathing, headache or visual disturbances   Complete by: As directed    Call MD for:  extreme fatigue   Complete by: As directed    Call MD for:  persistant dizziness or light-headedness   Complete by: As directed    Diet - low sodium heart healthy   Complete by: As directed    Increase activity slowly   Complete by: As directed       Allergies as of 08/16/2021       Reactions   Penicillins    Propranolol Hcl         Medication List     STOP taking these medications    diclofenac 75 MG EC tablet Commonly known as: VOLTAREN   hydrOXYzine 10 MG tablet Commonly known as: ATARAX   lisinopril 10 MG tablet Commonly known as: ZESTRIL   valsartan-hydrochlorothiazide 320-25 MG tablet Commonly known as: Diovan HCT       TAKE these medications    ALPRAZolam 0.25 MG tablet Commonly known as: XANAX Take 1 tablet (0.25 mg total) by mouth 2 (two) times daily as needed for anxiety.   B-12 PO Take 1 tablet by mouth daily.   D3 PO Take 1 capsule by mouth daily.   ferrous sulfate 325 (65 FE) MG tablet Take 1 tablet (325 mg total) by mouth 2 (two) times daily with a meal.   furosemide 20 MG tablet Commonly known as: LASIX Take 2 tablets (40 mg total) by mouth daily for 7 days, THEN 1 tablet (20 mg total) daily. Start taking on: August 16, 2021 What changed: See the new instructions.   ICAPS AREDS 2 PO Take 1 capsule by mouth daily. What changed: Another medication with the same name was removed. Continue taking this medication, and follow the directions you see here.   metoprolol succinate 25 MG 24 hr tablet Commonly known as: TOPROL-XL Take 25 mg by mouth daily.   pantoprazole 40 MG  tablet Commonly known as: PROTONIX Take 1 tablet (40 mg total) by mouth daily at 6 (six) AM.   polyethylene glycol powder 17 GM/SCOOP powder Commonly known as: MiraLax Take 17 g by mouth 2 (two) times daily as needed for moderate constipation.   potassium chloride 10 MEQ tablet Commonly known as: KLOR-CON Take 10 mEq by mouth daily as needed. Take 52mEq daily as needed when taking a dose of lasix   senna-docusate 8.6-50 MG tablet Commonly known as: Senokot-S Take 1 tablet by mouth 2 (two) times daily between meals as needed for mild constipation. What changed: when to take this   valsartan 40 MG tablet Commonly known as: Diovan Take 1 tablet (40 mg total)  by mouth daily.        Consultations: Gastroenterology  Procedures/Studies: EGD on 08/13/2021 1. GERD with large caliber esophageal stricture. 2. Gastric ulcers and erosions without stigmata. Likely cause for dark stools and iron deficiency anemia 3. Otherwise unremarkable EGD. Recommendation 1. Resume previous diet 2. Pantoprazole 40 mg daily indefinitely 3. Follow-up biopsies (we will) 4. Minimize NSAIDs 5. Solid iron sulfate 325 mg twice daily with meals and 6. PCP should monitor blood counts as outpatient to assess for adequate response to iron replacement  7. Please reach out for problems or questions. Outpatient GI follow-up required at this time. GI will sign off.   DG Chest 2 View  Result Date: 08/09/2021 CLINICAL DATA:  Reason for exam: sob The pt is c/o some sob bi-lateral leg swelling and hx of chf EXAM: CHEST - 2 VIEW COMPARISON:  None. FINDINGS: Cardiac silhouette mildly enlarged. No mediastinal or hilar masses or evidence of adenopathy. Prominent aortic arch. Clear lungs.  No pleural effusion or pneumothorax. Bilateral advanced glenohumeral joint arthropathic changes. IMPRESSION: 1. No acute cardiopulmonary disease. Electronically Signed   By: Lajean Manes M.D.   On: 08/09/2021 19:53   ECHOCARDIOGRAM  COMPLETE  Result Date: 08/12/2021    ECHOCARDIOGRAM REPORT   Patient Name:   Cassandra Wu Boettner Date of Exam: 08/12/2021 Medical Rec #:  [id number]             Height:       64.0 in Accession #:    [id number]            Weight:       155.9 lb Date of Birth:  1924/11/23            BSA:          1.760 m Patient Age:    89 years              BP:           138/80 mmHg Patient Gender: F                     HR:           91 bpm. Exam Location:  Inpatient Procedure: 2D Echo, Cardiac Doppler and Color Doppler Indications:    Dyspnea  History:        Patient has no prior history of Echocardiogram examinations.                 Risk Factors:Hypertension.  Sonographer:    Bernadene Person RDCS Referring Phys: [id number] [place]  1. Left ventricular ejection fraction, by estimation, is 60 to 65%. The left ventricle has normal function. The left ventricle has no regional wall motion abnormalities. There is mild left ventricular hypertrophy. Left ventricular diastolic parameters are consistent with Grade I diastolic dysfunction (impaired relaxation). Elevated left atrial pressure.  2. Right ventricular systolic function is normal. The right ventricular size is normal. There is normal pulmonary artery systolic pressure.  3. Left atrial size was mildly dilated.  4. The mitral valve is normal in structure. No evidence of mitral valve regurgitation. No evidence of mitral stenosis.  5. The aortic valve is tricuspid. There is mild calcification of the aortic valve. There is mild thickening of the aortic valve. Aortic valve regurgitation is not visualized. No aortic stenosis is present.  6. The inferior vena cava is normal in size with <50% respiratory variability, suggesting right atrial pressure of 8 mmHg. FINDINGS  Left Ventricle:  Left ventricular ejection fraction, by estimation, is 60 to 65%. The left ventricle has normal function. The left ventricle has no regional wall motion abnormalities. The left ventricular  internal cavity size was normal in size. There is  mild left ventricular hypertrophy. Left ventricular diastolic parameters are consistent with Grade I diastolic dysfunction (impaired relaxation). Elevated left atrial pressure. Right Ventricle: The right ventricular size is normal. No increase in right ventricular wall thickness. Right ventricular systolic function is normal. There is normal pulmonary artery systolic pressure. The tricuspid regurgitant velocity is 2.27 m/s, and  with an assumed right atrial pressure of 8 mmHg, the estimated right ventricular systolic pressure is 60.6 mmHg. Left Atrium: Left atrial size was mildly dilated. Right Atrium: Right atrial size was normal in size. Pericardium: There is no evidence of pericardial effusion. Mitral Valve: The mitral valve is normal in structure. No evidence of mitral valve regurgitation. No evidence of mitral valve stenosis. Tricuspid Valve: The tricuspid valve is normal in structure. Tricuspid valve regurgitation is mild . No evidence of tricuspid stenosis. Aortic Valve: The aortic valve is tricuspid. There is mild calcification of the aortic valve. There is mild thickening of the aortic valve. There is mild aortic valve annular calcification. Aortic valve regurgitation is not visualized. No aortic stenosis  is present. Aortic valve mean gradient measures 4.1 mmHg. Aortic valve peak gradient measures 9.7 mmHg. Aortic valve area, by VTI measures 2.81 cm. Pulmonic Valve: The pulmonic valve was not well visualized. Pulmonic valve regurgitation is not visualized. No evidence of pulmonic stenosis. Aorta: The aortic root is normal in size and structure. Venous: The inferior vena cava is normal in size with less than 50% respiratory variability, suggesting right atrial pressure of 8 mmHg. IAS/Shunts: The interatrial septum was not well visualized.  LEFT VENTRICLE PLAX 2D LVIDd:         3.60 cm   Diastology LVIDs:         2.50 cm   LV e' medial:    4.68 cm/s LV PW:          1.10 cm   LV E/e' medial:  18.6 LV IVS:        1.10 cm   LV e' lateral:   7.01 cm/s LVOT diam:     2.00 cm   LV E/e' lateral: 12.4 LV SV:         75 LV SV Index:   43 LVOT Area:     3.14 cm  RIGHT VENTRICLE RV S prime:     11.70 cm/s TAPSE (M-mode): 1.9 cm LEFT ATRIUM             Index        RIGHT ATRIUM           Index LA diam:        3.70 cm 2.10 cm/m   RA Area:     12.60 cm LA Vol (A2C):   53.5 ml 30.40 ml/m  RA Volume:   27.40 ml  15.57 ml/m LA Vol (A4C):   60.3 ml 34.27 ml/m LA Biplane Vol: 59.2 ml 33.64 ml/m  AORTIC VALVE AV Area (Vmax):    2.77 cm AV Area (Vmean):   3.10 cm AV Area (VTI):     2.81 cm AV Vmax:           155.66 cm/s AV Vmean:          91.335 cm/s AV VTI:  0.267 m AV Peak Grad:      9.7 mmHg AV Mean Grad:      4.1 mmHg LVOT Vmax:         137.00 cm/s LVOT Vmean:        90.000 cm/s LVOT VTI:          0.239 m LVOT/AV VTI ratio: 0.90  AORTA Ao Root diam: 3.40 cm Ao Asc diam:  3.30 cm MITRAL VALVE                TRICUSPID VALVE MV Area (PHT): 3.91 cm     TR Peak grad:   20.6 mmHg MV Decel Time: 194 msec     TR Vmax:        227.00 cm/s MV E velocity: 86.90 cm/s MV A velocity: 124.00 cm/s  SHUNTS MV E/A ratio:  0.70         Systemic VTI:  0.24 m                             Systemic Diam: 2.00 cm Carlyle Dolly MD Electronically signed by Carlyle Dolly MD Signature Date/Time: 08/12/2021/11:05:39 AM    Final        The results of significant diagnostics from this hospitalization (including imaging, microbiology, ancillary and laboratory) are listed below for reference.     Microbiology: Recent Results (from the past 240 hour(s))  Resp Panel by RT-PCR (Flu A&B, Covid) Nasopharyngeal Swab     Status: None   Collection Time: 08/10/21  4:09 AM   Specimen: Nasopharyngeal Swab; Nasopharyngeal(NP) swabs in vial transport medium  Result Value Ref Range Status   SARS Coronavirus 2 by RT PCR NEGATIVE NEGATIVE Final    Comment: (NOTE) SARS-CoV-2 target nucleic acids are  NOT DETECTED.  The SARS-CoV-2 RNA is generally detectable in upper respiratory specimens during the acute phase of infection. The lowest concentration of SARS-CoV-2 viral copies this assay can detect is 138 copies/mL. A negative result does not preclude SARS-Cov-2 infection and should not be used as the sole basis for treatment or other patient management decisions. A negative result may occur with  improper specimen collection/handling, submission of specimen other than nasopharyngeal swab, presence of viral mutation(s) within the areas targeted by this assay, and inadequate number of viral copies(<138 copies/mL). A negative result must be combined with clinical observations, patient history, and epidemiological information. The expected result is Negative.  Fact Sheet for Patients:  EntrepreneurPulse.com.au  Fact Sheet for Healthcare Providers:  IncredibleEmployment.be  This test is no t yet approved or cleared by the [place] FDA and  has been authorized for detection and/or diagnosis of SARS-CoV-2 by FDA under an Emergency Use Authorization (EUA). This EUA will remain  in effect (meaning this test can be used) for the duration of the COVID-19 declaration under Section 564(b)(1) of the Act, 21 U.S.C.section 360bbb-3(b)(1), unless the authorization is terminated  or revoked sooner.       Influenza A by PCR NEGATIVE NEGATIVE Final   Influenza B by PCR NEGATIVE NEGATIVE Final    Comment: (NOTE) The Xpert Xpress SARS-CoV-2/FLU/RSV plus assay is intended as an aid in the diagnosis of influenza from Nasopharyngeal swab specimens and should not be used as a sole basis for treatment. Nasal washings and aspirates are unacceptable for Xpert Xpress SARS-CoV-2/FLU/RSV testing.  Fact Sheet for Patients: EntrepreneurPulse.com.au  Fact Sheet for Healthcare Providers: IncredibleEmployment.be  This test is not  yet approved or cleared  by the [place] and has been authorized for detection and/or diagnosis of SARS-CoV-2 by FDA under an Emergency Use Authorization (EUA). This EUA will remain in effect (meaning this test can be used) for the duration of the COVID-19 declaration under Section 564(b)(1) of the Act, 21 U.S.C. section 360bbb-3(b)(1), unless the authorization is terminated or revoked.  Performed at [facility], [place] [address] [place], [place] [address]   Resp Panel by RT-PCR (Flu A&B, Covid) Nasopharyngeal Swab     Status: None   Collection Time: 08/15/21  3:04 PM   Specimen: Nasopharyngeal Swab; Nasopharyngeal(NP) swabs in vial transport medium  Result Value Ref Range Status   SARS Coronavirus 2 by RT PCR NEGATIVE NEGATIVE Final    Comment: (NOTE) SARS-CoV-2 target nucleic acids are NOT DETECTED.  The SARS-CoV-2 RNA is generally detectable in upper respiratory specimens during the acute phase of infection. The lowest concentration of SARS-CoV-2 viral copies this assay can detect is 138 copies/mL. A negative result does not preclude SARS-Cov-2 infection and should not be used as the sole basis for treatment or other patient management decisions. A negative result may occur with  improper specimen collection/handling, submission of specimen other than nasopharyngeal swab, presence of viral mutation(s) within the areas targeted by this assay, and inadequate number of viral copies(<138 copies/mL). A negative result must be combined with clinical observations, patient history, and epidemiological information. The expected result is Negative.  Fact Sheet for Patients:  EntrepreneurPulse.com.au  Fact Sheet for Healthcare Providers:  IncredibleEmployment.be  This test is no t yet approved or cleared by the [place] FDA and  has been authorized for detection and/or diagnosis of SARS-CoV-2 by FDA under an Emergency Use  Authorization (EUA). This EUA will remain  in effect (meaning this test can be used) for the duration of the COVID-19 declaration under Section 564(b)(1) of the Act, 21 U.S.C.section 360bbb-3(b)(1), unless the authorization is terminated  or revoked sooner.       Influenza A by PCR NEGATIVE NEGATIVE Final   Influenza B by PCR NEGATIVE NEGATIVE Final    Comment: (NOTE) The Xpert Xpress SARS-CoV-2/FLU/RSV plus assay is intended as an aid in the diagnosis of influenza from Nasopharyngeal swab specimens and should not be used as a sole basis for treatment. Nasal washings and aspirates are unacceptable for Xpert Xpress SARS-CoV-2/FLU/RSV testing.  Fact Sheet for Patients: EntrepreneurPulse.com.au  Fact Sheet for Healthcare Providers: IncredibleEmployment.be  This test is not yet approved or cleared by the [place] FDA and has been authorized for detection and/or diagnosis of SARS-CoV-2 by FDA under an Emergency Use Authorization (EUA). This EUA will remain in effect (meaning this test can be used) for the duration of the COVID-19 declaration under Section 564(b)(1) of the Act, 21 U.S.C. section 360bbb-3(b)(1), unless the authorization is terminated or revoked.  Performed at [facility], [place] [address] [place], [place] [address]      Labs:  CBC: Recent Labs  Lab 08/09/21 1847 08/10/21 1146 08/10/21 2300 08/13/21 0047 08/13/21 1338 08/14/21 0047 08/14/21 1127 08/16/21 0521  WBC 3.1* 2.6*  --   --   --   --   --   --   HGB 8.1* 8.4*   < > 7.8* 7.2* 7.6* 8.8* 8.0*  HCT 26.8* 26.1*   < > 23.4* 21.7* 24.0* 28.0* 25.8*  MCV 81.5 79.3*  --   --   --   --   --   --   PLT 193  194  --   --   --   --   --   --    < > = values in this interval not displayed.   BMP &GFR Recent Labs  Lab 08/12/21 0006 08/13/21 0047 08/14/21 0047 08/15/21 0212 08/15/21 1514 08/15/21 2224 08/16/21 0041 08/16/21 0521  NA 132* 127* 130* 125*  131* 129* 129* 132*  K 3.5 3.4* 3.4* 4.3  --   --  4.2  --   CL 96* 92* 94* 93*  --   --  94*  --   CO2 28 27 28 27   --   --  27  --   GLUCOSE 127* 114* 113* 109*  --   --  103*  --   BUN 16 14 14 17   --   --  16  --   CREATININE 0.66 0.64 0.63 0.59  --   --  0.56  --   CALCIUM 9.0 8.6* 8.7* 9.2  --   --  9.0  --   MG  --  1.7 1.8 1.9  --   --   --  1.9  PHOS  --   --   --  3.1  --   --  3.5  --    Estimated Creatinine Clearance: 41.4 mL/min (by C-G formula based on SCr of 0.56 mg/dL). Liver & Pancreas: Recent Labs  Lab 08/15/21 0212 08/16/21 0041  ALBUMIN 2.6* 2.6*   No results for input(s): LIPASE, AMYLASE in the last 168 hours. No results for input(s): AMMONIA in the last 168 hours. Diabetic: No results for input(s): HGBA1C in the last 72 hours. No results for input(s): GLUCAP in the last 168 hours. Cardiac Enzymes: No results for input(s): CKTOTAL, CKMB, CKMBINDEX, TROPONINI in the last 168 hours. No results for input(s): PROBNP in the last 8760 hours. Coagulation Profile: No results for input(s): INR, PROTIME in the last 168 hours. Thyroid Function Tests: No results for input(s): TSH, T4TOTAL, FREET4, T3FREE, THYROIDAB in the last 72 hours. Lipid Profile: No results for input(s): CHOL, HDL, LDLCALC, TRIG, CHOLHDL, LDLDIRECT in the last 72 hours. Anemia Panel: No results for input(s): VITAMINB12, FOLATE, FERRITIN, TIBC, IRON, RETICCTPCT in the last 72 hours.  Urine analysis:    Component Value Date/Time   LABSPEC 1.020 03/11/2019 1835   PHURINE 6.5 03/11/2019 1835   GLUCOSEU NEGATIVE 03/11/2019 1835   HGBUR NEGATIVE 03/11/2019 1835   BILIRUBINUR NEGATIVE 03/11/2019 1835   KETONESUR NEGATIVE 03/11/2019 1835   PROTEINUR NEGATIVE 03/11/2019 1835   UROBILINOGEN 0.2 03/11/2019 1835   NITRITE NEGATIVE 03/11/2019 1835   LEUKOCYTESUR NEGATIVE 03/11/2019 1835   Sepsis Labs: Invalid input(s): PROCALCITONIN, LACTICIDVEN   Time coordinating discharge: 55  minutes  SIGNED:  Mercy Riding, MD  [facility] 08/16/2021, 7:51 AM

## 2021-08-16 NOTE — Progress Notes (Signed)
Report given to [place], RN

## 2021-09-10 ENCOUNTER — Other Ambulatory Visit: Payer: Self-pay | Admitting: *Deleted

## 2021-09-11 DIAGNOSIS — K922 Gastrointestinal hemorrhage, unspecified: Secondary | ICD-10-CM | POA: Diagnosis not present

## 2021-09-11 DIAGNOSIS — D649 Anemia, unspecified: Secondary | ICD-10-CM | POA: Diagnosis not present

## 2021-09-11 DIAGNOSIS — M25562 Pain in left knee: Secondary | ICD-10-CM | POA: Diagnosis not present

## 2021-09-11 DIAGNOSIS — K259 Gastric ulcer, unspecified as acute or chronic, without hemorrhage or perforation: Secondary | ICD-10-CM | POA: Diagnosis not present

## 2021-09-11 DIAGNOSIS — I10 Essential (primary) hypertension: Secondary | ICD-10-CM | POA: Diagnosis not present

## 2021-09-11 DIAGNOSIS — R609 Edema, unspecified: Secondary | ICD-10-CM | POA: Diagnosis not present

## 2021-09-11 DIAGNOSIS — R2689 Other abnormalities of gait and mobility: Secondary | ICD-10-CM | POA: Diagnosis not present

## 2021-09-11 DIAGNOSIS — I509 Heart failure, unspecified: Secondary | ICD-10-CM | POA: Diagnosis not present

## 2021-09-11 DIAGNOSIS — I890 Lymphedema, not elsewhere classified: Secondary | ICD-10-CM | POA: Diagnosis not present

## 2021-09-11 DIAGNOSIS — K222 Esophageal obstruction: Secondary | ICD-10-CM | POA: Diagnosis not present

## 2021-09-11 DIAGNOSIS — I5033 Acute on chronic diastolic (congestive) heart failure: Secondary | ICD-10-CM | POA: Diagnosis not present

## 2021-09-11 DIAGNOSIS — R0600 Dyspnea, unspecified: Secondary | ICD-10-CM | POA: Diagnosis not present

## 2021-09-11 DIAGNOSIS — G5600 Carpal tunnel syndrome, unspecified upper limb: Secondary | ICD-10-CM | POA: Diagnosis not present

## 2021-09-11 DIAGNOSIS — R5381 Other malaise: Secondary | ICD-10-CM | POA: Diagnosis not present

## 2021-09-11 DIAGNOSIS — M6281 Muscle weakness (generalized): Secondary | ICD-10-CM | POA: Diagnosis not present

## 2021-09-11 DIAGNOSIS — D72819 Decreased white blood cell count, unspecified: Secondary | ICD-10-CM | POA: Diagnosis not present

## 2021-09-11 DIAGNOSIS — R262 Difficulty in walking, not elsewhere classified: Secondary | ICD-10-CM | POA: Diagnosis not present

## 2021-09-11 DIAGNOSIS — D709 Neutropenia, unspecified: Secondary | ICD-10-CM | POA: Diagnosis not present

## 2021-09-11 DIAGNOSIS — Z9010 Acquired absence of unspecified breast and nipple: Secondary | ICD-10-CM | POA: Diagnosis not present

## 2021-09-11 DIAGNOSIS — K250 Acute gastric ulcer with hemorrhage: Secondary | ICD-10-CM | POA: Diagnosis not present

## 2021-09-11 DIAGNOSIS — G459 Transient cerebral ischemic attack, unspecified: Secondary | ICD-10-CM | POA: Diagnosis not present

## 2021-09-11 DIAGNOSIS — M25561 Pain in right knee: Secondary | ICD-10-CM | POA: Diagnosis not present

## 2021-09-11 DIAGNOSIS — R0789 Other chest pain: Secondary | ICD-10-CM | POA: Diagnosis not present

## 2021-09-11 DIAGNOSIS — G4700 Insomnia, unspecified: Secondary | ICD-10-CM | POA: Diagnosis not present

## 2021-09-11 DIAGNOSIS — R195 Other fecal abnormalities: Secondary | ICD-10-CM | POA: Diagnosis not present

## 2021-09-11 DIAGNOSIS — K5900 Constipation, unspecified: Secondary | ICD-10-CM | POA: Diagnosis not present

## 2021-09-12 ENCOUNTER — Telehealth: Payer: Self-pay | Admitting: Hematology and Oncology

## 2021-09-12 NOTE — Telephone Encounter (Signed)
Scheduled appt per 1/26 referral. Spoke to pt's daughter who is aware of appt date and time. She is aware pt needs to arrive 15 mins prior to appt time.

## 2021-09-16 DIAGNOSIS — D709 Neutropenia, unspecified: Secondary | ICD-10-CM | POA: Diagnosis not present

## 2021-09-16 DIAGNOSIS — R5381 Other malaise: Secondary | ICD-10-CM | POA: Diagnosis not present

## 2021-09-16 DIAGNOSIS — K259 Gastric ulcer, unspecified as acute or chronic, without hemorrhage or perforation: Secondary | ICD-10-CM | POA: Diagnosis not present

## 2021-09-16 DIAGNOSIS — I509 Heart failure, unspecified: Secondary | ICD-10-CM | POA: Diagnosis not present

## 2021-09-25 DIAGNOSIS — R5381 Other malaise: Secondary | ICD-10-CM | POA: Diagnosis not present

## 2021-09-25 DIAGNOSIS — D649 Anemia, unspecified: Secondary | ICD-10-CM | POA: Diagnosis not present

## 2021-09-25 DIAGNOSIS — G4700 Insomnia, unspecified: Secondary | ICD-10-CM | POA: Diagnosis not present

## 2021-09-25 DIAGNOSIS — I509 Heart failure, unspecified: Secondary | ICD-10-CM | POA: Diagnosis not present

## 2021-09-25 DIAGNOSIS — D72819 Decreased white blood cell count, unspecified: Secondary | ICD-10-CM | POA: Diagnosis not present

## 2021-09-25 DIAGNOSIS — K5900 Constipation, unspecified: Secondary | ICD-10-CM | POA: Diagnosis not present

## 2021-09-25 DIAGNOSIS — M25561 Pain in right knee: Secondary | ICD-10-CM | POA: Diagnosis not present

## 2021-09-25 DIAGNOSIS — I10 Essential (primary) hypertension: Secondary | ICD-10-CM | POA: Diagnosis not present

## 2021-10-02 ENCOUNTER — Telehealth: Payer: Self-pay | Admitting: Hematology and Oncology

## 2021-10-02 ENCOUNTER — Other Ambulatory Visit: Payer: Self-pay

## 2021-10-02 ENCOUNTER — Inpatient Hospital Stay: Payer: Medicare Other | Attending: Hematology and Oncology | Admitting: Hematology and Oncology

## 2021-10-02 ENCOUNTER — Inpatient Hospital Stay: Payer: Medicare Other

## 2021-10-02 VITALS — BP 138/85 | HR 85 | Temp 97.8°F | Resp 17

## 2021-10-02 DIAGNOSIS — D708 Other neutropenia: Secondary | ICD-10-CM | POA: Diagnosis not present

## 2021-10-02 DIAGNOSIS — Z853 Personal history of malignant neoplasm of breast: Secondary | ICD-10-CM | POA: Insufficient documentation

## 2021-10-02 DIAGNOSIS — D500 Iron deficiency anemia secondary to blood loss (chronic): Secondary | ICD-10-CM

## 2021-10-02 DIAGNOSIS — I10 Essential (primary) hypertension: Secondary | ICD-10-CM | POA: Insufficient documentation

## 2021-10-02 DIAGNOSIS — D709 Neutropenia, unspecified: Secondary | ICD-10-CM | POA: Insufficient documentation

## 2021-10-02 LAB — CBC WITH DIFFERENTIAL (CANCER CENTER ONLY)
Abs Immature Granulocytes: 0.01 10*3/uL (ref 0.00–0.07)
Basophils Absolute: 0 10*3/uL (ref 0.0–0.1)
Basophils Relative: 0 %
Eosinophils Absolute: 0.1 10*3/uL (ref 0.0–0.5)
Eosinophils Relative: 2 %
HCT: 31.9 % — ABNORMAL LOW (ref 36.0–46.0)
Hemoglobin: 10.1 g/dL — ABNORMAL LOW (ref 12.0–15.0)
Immature Granulocytes: 0 %
Lymphocytes Relative: 33 %
Lymphs Abs: 0.9 10*3/uL (ref 0.7–4.0)
MCH: 24.9 pg — ABNORMAL LOW (ref 26.0–34.0)
MCHC: 31.7 g/dL (ref 30.0–36.0)
MCV: 78.8 fL — ABNORMAL LOW (ref 80.0–100.0)
Monocytes Absolute: 0.4 10*3/uL (ref 0.1–1.0)
Monocytes Relative: 13 %
Neutro Abs: 1.5 10*3/uL — ABNORMAL LOW (ref 1.7–7.7)
Neutrophils Relative %: 52 %
Platelet Count: 244 10*3/uL (ref 150–400)
RBC: 4.05 MIL/uL (ref 3.87–5.11)
RDW: 18.9 % — ABNORMAL HIGH (ref 11.5–15.5)
WBC Count: 2.9 10*3/uL — ABNORMAL LOW (ref 4.0–10.5)
nRBC: 0 % (ref 0.0–0.2)

## 2021-10-02 LAB — HEPATITIS B SURFACE ANTIBODY,QUALITATIVE: Hep B S Ab: NONREACTIVE

## 2021-10-02 LAB — CMP (CANCER CENTER ONLY)
ALT: 14 U/L (ref 0–44)
AST: 22 U/L (ref 15–41)
Albumin: 3.6 g/dL (ref 3.5–5.0)
Alkaline Phosphatase: 75 U/L (ref 38–126)
Anion gap: 6 (ref 5–15)
BUN: 17 mg/dL (ref 8–23)
CO2: 26 mmol/L (ref 22–32)
Calcium: 9.3 mg/dL (ref 8.9–10.3)
Chloride: 97 mmol/L — ABNORMAL LOW (ref 98–111)
Creatinine: 0.74 mg/dL (ref 0.44–1.00)
GFR, Estimated: >60 mL/min (ref >60)
Glucose, Bld: 92 mg/dL (ref 70–99)
Potassium: 4.7 mmol/L (ref 3.5–5.1)
Sodium: 129 mmol/L — ABNORMAL LOW (ref 135–145)
Total Bilirubin: 0.3 mg/dL (ref 0.3–1.2)
Total Protein: 7.1 g/dL (ref 6.5–8.1)

## 2021-10-02 LAB — RETIC PANEL
Immature Retic Fract: 12.1 % (ref 2.3–15.9)
RBC.: 4 MIL/uL (ref 3.87–5.11)
Retic Count, Absolute: 53.6 10*3/uL (ref 19.0–186.0)
Retic Ct Pct: 1.3 % (ref 0.4–3.1)
Reticulocyte Hemoglobin: 30.7 pg (ref >27.9)

## 2021-10-02 LAB — HEPATITIS C ANTIBODY: HCV Ab: NONREACTIVE

## 2021-10-02 LAB — HEPATITIS B CORE ANTIBODY, TOTAL: Hep B Core Total Ab: NONREACTIVE

## 2021-10-02 LAB — HEPATITIS B SURFACE ANTIGEN: Hepatitis B Surface Ag: NONREACTIVE

## 2021-10-02 LAB — VITAMIN B12: Vitamin B-12: 1047 pg/mL — ABNORMAL HIGH (ref 180–914)

## 2021-10-02 LAB — IRON AND IRON BINDING CAPACITY (CC-WL,HP ONLY)
Iron: 42 ug/dL (ref 28–170)
Saturation Ratios: 16 % (ref 10.4–31.8)
TIBC: 261 ug/dL (ref 250–450)
UIBC: 219 ug/dL

## 2021-10-02 NOTE — Telephone Encounter (Signed)
Called pt per 2/21 pt req/inbasket, no answer, spoke with pt daughter on file (VIA), pt daughter instructed to call back, no answer again,notified desk nurse

## 2021-10-02 NOTE — Progress Notes (Signed)
[place] [phone]   Fax:([phone]) [place] NOTE  Patient Care Team: Joseph Art, MD as PCP - General (Internal Medicine)  Hematological/Oncological History # Neutropenia  # Iron Deficiency Anemia 2/2 to GI Bleeding 08/10/2021: WBC 2.6, Hgb 8.4, MCV 79.3, Plt 194 08/12/2021: Iron 14, Ferritin 14, TIBC 315, Sat 4%. Vitamin b12 473 08/13/2021: EGD performed which showed 2 non-bleeding gastric ulcers and esophagitis. Though to be NSAID induced.  10/02/2021: establish care with Dr. Lorenso Courier   CHIEF COMPLAINTS/PURPOSE OF CONSULTATION:  "Neutropenia/Anemia "  HISTORY OF PRESENTING ILLNESS:  Cassandra Wu 88 y.o. female with medical history significant for carpal tunnel syndrome and breast cancer who presents for evaluation of neutropenia/anemia.   On review of the previous records Cassandra Wu was made to the hospital from 08/09/2021 until 08/14/2021 due to shortness of breath, bilateral lower extremity edema, and melena.  During her hospitalization she was found to have severe iron deficiency anemia as well as white blood cell count of 2.6.  EGD was performed on 08/13/2021 which showed 2 nonbleeding gastric ulcers as well as esophagitis.  This was thought to be NSAID induced.  She was given IV iron therapy and discharged.  Due to concern for this iron deficiency anemia and neutropenia she was referred to hematology for further evaluation and management.  On exam today [name] notes that she does have some constipation when taking iron pills.  She notes that she has had only 2 bowel movements since last Thursday.  She notes that she took some "powerful laxatives" on Wednesday and did have reasonable bowel movements.  She notes that she is not having any dark stools or overt signs of blood in the stool.  She reports they are normal brown color.  She notes that she has had low white blood cell counts for quite some time, and that white blood cell count of  approximately 2.7 is average for her.  She notes that her daughter has low platelets but no low white blood cells.  She is also had no recent issues with infections or a COVID infection.  On further discussion she notes that in terms of her diet she does have some food allergies and has to avoid pork and nightshade vegetables such as tomatoes, peppers, and okra.  She notes that she is not currently using any orange juice because the acid upsets her stomach.  She notes that she rarely eats red meat and prefers fish chicken and [place].  Her family history is remarkable for lupus on her mother side of the family.  She notes that she is a never smoker never drinker and previously worked as a Music therapist.  On further discussion she does report that she had a mastectomy in 1988 and was on tamoxifen therapy for 5 years.  She did not undergo any radiation or chemotherapy.  She notes that she feels much better since her discharge from the hospital but wishes that she could further improve.  She notes that she does have swollen ankles and does feel somewhat weak.  She currently denies any fevers, chills, sweats, nausea, vomiting or diarrhea.  A full 10 point ROS is listed below.  MEDICAL HISTORY:  Past Medical History:  Diagnosis Date   Breast cancer [name])    Carpal tunnel syndrome    Hypertension     SURGICAL HISTORY: Past Surgical History:  Procedure Laterality Date   BIOPSY  08/13/2021   Procedure: BIOPSY;  Surgeon:  Irene Shipper, MD;  Location: [facility] ENDOSCOPY;  Service: Endoscopy;;   ESOPHAGOGASTRODUODENOSCOPY (EGD) WITH PROPOFOL N/A 08/13/2021   Procedure: ESOPHAGOGASTRODUODENOSCOPY (EGD) WITH PROPOFOL;  Surgeon: Irene Shipper, MD;  Location: [facility] ENDOSCOPY;  Service: Endoscopy;  Laterality: N/A;   left breast mastectomy (other)     TONSILLECTOMY      SOCIAL HISTORY: Social History   Socioeconomic History   Marital status: Widowed    Spouse name: Not on file   Number of children: Not  on file   Years of education: Not on file   Highest education level: Not on file  Occupational History   Not on file  Tobacco Use   Smoking status: Never   Smokeless tobacco: Never  Substance and Sexual Activity   Alcohol use: No   Drug use: Not on file   Sexual activity: Not on file  Other Topics Concern   Not on file  Social History Narrative   Widowed.    Social Determinants of Health   Financial Resource Strain: Not on file  Food Insecurity: Not on file  Transportation Needs: Not on file  Physical Activity: Not on file  Stress: Not on file  Social Connections: Not on file  Intimate Partner Violence: Not on file    FAMILY HISTORY: No family history on file.  ALLERGIES:  is allergic to penicillins and propranolol hcl.  MEDICATIONS:  Current Outpatient Medications  Medication Sig Dispense Refill   Dexlansoprazole 30 MG capsule DR Take 30 mg by mouth daily.     diclofenac Sodium (VOLTAREN) 1 % GEL Apply topically 4 (four) times daily.     ALPRAZolam (XANAX) 0.25 MG tablet Take 1 tablet (0.25 mg total) by mouth 2 (two) times daily as needed for anxiety. 30 tablet 0   Cholecalciferol (D3 PO) Take 1 capsule by mouth daily.     Cyanocobalamin (B-12 PO) Take 1 tablet by mouth daily.     ferrous sulfate 325 (65 FE) MG tablet Take 1 tablet (325 mg total) by mouth 2 (two) times daily with a meal. 180 tablet 3   furosemide (LASIX) 20 MG tablet Take 2 tablets (40 mg total) by mouth daily for 7 days, THEN 1 tablet (20 mg total) daily. 90 tablet 0   metoprolol succinate (TOPROL-XL) 25 MG 24 hr tablet Take 25 mg by mouth daily.     Multiple Vitamins-Minerals (ICAPS AREDS 2 PO) Take 1 capsule by mouth daily.     polyethylene glycol powder (MIRALAX) 17 GM/SCOOP powder Take 17 g by mouth 2 (two) times daily as needed for moderate constipation. 255 g 0   potassium chloride (KLOR-CON) 10 MEQ tablet Take 10 mEq by mouth daily as needed. Take 73mEq daily as needed when taking a dose of  lasix     senna-docusate (SENOKOT-S) 8.6-50 MG tablet Take 1 tablet by mouth 2 (two) times daily between meals as needed for mild constipation. 180 tablet 0   valsartan (DIOVAN) 40 MG tablet Take 1 tablet (40 mg total) by mouth daily. 30 tablet 11   No current facility-administered medications for this visit.    REVIEW OF SYSTEMS:   Constitutional: ( - ) fevers, ( - )  chills , ( - ) night sweats Eyes: ( - ) blurriness of vision, ( - ) double vision, ( - ) watery eyes Ears, nose, mouth, throat, and face: ( - ) mucositis, ( - ) sore throat Respiratory: ( - ) cough, ( - ) dyspnea, ( - ) wheezes Cardiovascular: ( - )  palpitation, ( - ) chest discomfort, ( - ) lower extremity swelling Gastrointestinal:  ( - ) nausea, ( - ) heartburn, ( - ) change in bowel habits Skin: ( - ) abnormal skin rashes Lymphatics: ( - ) new lymphadenopathy, ( - ) easy bruising Neurological: ( - ) numbness, ( - ) tingling, ( - ) new weaknesses Behavioral/Psych: ( - ) mood change, ( - ) new changes  All other systems were reviewed with the patient and are negative.  PHYSICAL EXAMINATION:  Vitals:   10/02/21 1402  BP: 138/85  Pulse: 85  Resp: 17  Temp: 97.8 F (36.6 C)  SpO2: 100%   Filed Weights    GENERAL: well appearing elderly female in NAD  SKIN: skin color, texture, turgor are normal, no rashes or significant lesions EYES: conjunctiva are pink and non-injected, sclera clear LUNGS: clear to auscultation and percussion with normal breathing effort HEART: regular rate & rhythm and no murmurs and no lower extremity edema Musculoskeletal: no cyanosis of digits and no clubbing  PSYCH: alert & oriented x 3, fluent speech NEURO: no focal motor/sensory deficits  LABORATORY DATA:  I have reviewed the data as listed CBC Latest Ref Rng & Units 10/02/2021 08/16/2021 08/14/2021  WBC 4.0 - 10.5 K/uL 2.9(L) - -  Hemoglobin 12.0 - 15.0 g/dL 10.1(L) 8.0(L) 8.8(L)  Hematocrit 36.0 - 46.0 % 31.9(L) 25.8(L) 28.0(L)   Platelets 150 - 400 K/uL 244 - -    CMP Latest Ref Rng & Units 10/02/2021 08/16/2021 08/16/2021  Glucose 70 - 99 mg/dL 92 - -  BUN 8 - 23 mg/dL 17 - -  Creatinine 0.44 - 1.00 mg/dL 0.74 - -  Sodium 135 - 145 mmol/L 129(L) 129(L) 132(L)  Potassium 3.5 - 5.1 mmol/L 4.7 - -  Chloride 98 - 111 mmol/L 97(L) - -  CO2 22 - 32 mmol/L 26 - -  Calcium 8.9 - 10.3 mg/dL 9.3 - -  Total Protein 6.5 - 8.1 g/dL 7.1 - -  Total Bilirubin 0.3 - 1.2 mg/dL 0.3 - -  Alkaline Phos 38 - 126 U/L 75 - -  AST 15 - 41 U/L 22 - -  ALT 0 - 44 U/L 14 - -     ASSESSMENT & PLAN Raveena Stockton Kranz 88 y.o. female with medical history significant for carpal tunnel syndrome and breast cancer who presents for evaluation of neutropenia/anemia.   After review of the labs, review of the records, and discussion with the patient the patients findings are most consistent with neutropenia, iron deficiency anemia, and prior history of breast cancer.  The patient's breast cancer was adequately treated in 1988 and she is long since in survivorship.  Given her advanced age she does not wish to pursue any further screening imaging or testing for breast cancer.  I do believe this is appropriate given that she may not be able to tolerate treatments at her advanced age if an abnormality were to be found.  In terms of neutropenia the patient has had longstanding neutropenia but fortunately has an Eddy greater than 1.5.  We will order hepatitis serologies as well as nutritional panel in order to assess for possible etiology.  Additionally her iron deficiency anemia is clearly secondary to her heavy GI bleed during her prior hospitalization.  Her hemoglobin levels are rising we will reassess her iron levels to ensure she has adequate nutrition in order to help bolster her blood counts.  We will plan to see the patient back in approximate  3 months time in order sure her blood counts continue to improve.  She voiced understanding of the plan moving  forward.  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  Fortunately her [place] has consistently been greater than 1.5 and she has no signs or symptoms concerning for recurrent infection --Labs today show white blood cell count 2.9, hemoglobin 10.1, MCV 78.8, platelets of 244, and ANC 1.5 --Return to clinic in 3 months time to reevaluate  #Breast Cancer --s/p mastectomy in 1988 with adjuvant tamoxifen x 5 years  --patient currently in survivorship -- Discussed with patient who did not wish to pursue further mammograms/breast exams given her advanced age and the unlikelihood that she would do anything about abnormal results. -- Continue to monitor  Orders Placed This Encounter  Procedures   CBC with Differential (Cancer Center Only)    Standing Status:   Future    Number of Occurrences:   1    Standing Expiration Date:   10/02/2022   CMP ([place] only)    Standing Status:   Future    Number of Occurrences:   1    Standing Expiration Date:   10/02/2022   Ferritin    Standing Status:   Future    Number of Occurrences:   1    Standing Expiration Date:   10/02/2022   Iron and Iron Binding Capacity ([facility] only)    Standing Status:   Future    Number of Occurrences:   1    Standing Expiration Date:   10/02/2022   Retic Panel    Standing Status:   Future    Number of Occurrences:   1    Standing Expiration Date:   10/02/2022   Vitamin B12    Standing Status:   Future    Number of Occurrences:   1    Standing Expiration Date:   10/02/2022   Methylmalonic acid, serum    Standing Status:   Future    Number of Occurrences:   1    Standing Expiration Date:   10/02/2022   Hepatitis C antibody    Standing Status:    Future    Number of Occurrences:   1    Standing Expiration Date:   10/02/2022   Hepatitis B surface antigen    Standing Status:   Future    Number of Occurrences:   1    Standing Expiration Date:   10/02/2022   Hepatitis B surface antibody    Standing Status:   Future    Number of Occurrences:   1    Standing Expiration Date:   10/02/2022   Hepatitis B core antibody, total    Standing Status:   Future    Number of Occurrences:   1    Standing Expiration Date:   10/02/2022    All questions were answered. The patient knows to call the clinic with any problems, questions or concerns.  A total of more than 60 minutes were spent on this encounter with face-to-face time and non-face-to-face time, including preparing to see the patient, ordering tests and/or medications, counseling the patient and coordination of care as outlined above.   Ledell Peoples, MD Department of Hematology/Oncology [facility]  at [facility] Phone: [phone] Pager: [phone] Email: [email or web address]  10/08/2021 9:58 AM

## 2021-10-03 LAB — FERRITIN: Ferritin: 199 ng/mL (ref 11–307)

## 2021-10-08 ENCOUNTER — Telehealth: Payer: Self-pay | Admitting: *Deleted

## 2021-10-08 NOTE — Telephone Encounter (Signed)
Notified of message below

## 2021-10-08 NOTE — Telephone Encounter (Signed)
-----   Message from Orson Slick, MD sent at 10/08/2021  9:58 AM EST ----- Please let Cassandra Wu know that her hemoglobin level has improved to 10.1.  Her iron stores are also well boosted after the IV iron therapy she received in the hospital.  We did not find any other abnormalities in her blood.  We will plan to see her back in approximately 3 months time in order to assure that her hemoglobin continues to increase towards normal level. ----- Message ----- From: Interface, Lab In [place] Sent: 10/02/2021   3:40 PM EST To: Orson Slick, MD

## 2021-10-09 DIAGNOSIS — I110 Hypertensive heart disease with heart failure: Secondary | ICD-10-CM | POA: Diagnosis not present

## 2021-10-09 DIAGNOSIS — I890 Lymphedema, not elsewhere classified: Secondary | ICD-10-CM | POA: Diagnosis not present

## 2021-10-09 DIAGNOSIS — I5033 Acute on chronic diastolic (congestive) heart failure: Secondary | ICD-10-CM | POA: Diagnosis not present

## 2021-10-09 DIAGNOSIS — D509 Iron deficiency anemia, unspecified: Secondary | ICD-10-CM | POA: Diagnosis not present

## 2021-10-11 DIAGNOSIS — I890 Lymphedema, not elsewhere classified: Secondary | ICD-10-CM | POA: Diagnosis not present

## 2021-10-11 DIAGNOSIS — I10 Essential (primary) hypertension: Secondary | ICD-10-CM | POA: Diagnosis not present

## 2021-10-11 DIAGNOSIS — D509 Iron deficiency anemia, unspecified: Secondary | ICD-10-CM | POA: Diagnosis not present

## 2021-10-11 DIAGNOSIS — M1990 Unspecified osteoarthritis, unspecified site: Secondary | ICD-10-CM | POA: Diagnosis not present

## 2021-10-11 DIAGNOSIS — I110 Hypertensive heart disease with heart failure: Secondary | ICD-10-CM | POA: Diagnosis not present

## 2021-10-11 DIAGNOSIS — I5033 Acute on chronic diastolic (congestive) heart failure: Secondary | ICD-10-CM | POA: Diagnosis not present

## 2021-10-14 DIAGNOSIS — I890 Lymphedema, not elsewhere classified: Secondary | ICD-10-CM | POA: Diagnosis not present

## 2021-10-14 DIAGNOSIS — I110 Hypertensive heart disease with heart failure: Secondary | ICD-10-CM | POA: Diagnosis not present

## 2021-10-14 DIAGNOSIS — D509 Iron deficiency anemia, unspecified: Secondary | ICD-10-CM | POA: Diagnosis not present

## 2021-10-14 DIAGNOSIS — I5033 Acute on chronic diastolic (congestive) heart failure: Secondary | ICD-10-CM | POA: Diagnosis not present

## 2021-10-16 DIAGNOSIS — M7989 Other specified soft tissue disorders: Secondary | ICD-10-CM | POA: Diagnosis not present

## 2021-10-16 DIAGNOSIS — L299 Pruritus, unspecified: Secondary | ICD-10-CM | POA: Diagnosis not present

## 2021-10-16 DIAGNOSIS — I890 Lymphedema, not elsewhere classified: Secondary | ICD-10-CM | POA: Diagnosis not present

## 2021-10-16 DIAGNOSIS — R6889 Other general symptoms and signs: Secondary | ICD-10-CM | POA: Diagnosis not present

## 2021-10-16 DIAGNOSIS — R200 Anesthesia of skin: Secondary | ICD-10-CM | POA: Diagnosis not present

## 2021-10-16 DIAGNOSIS — Z09 Encounter for follow-up examination after completed treatment for conditions other than malignant neoplasm: Secondary | ICD-10-CM | POA: Diagnosis not present

## 2021-10-16 DIAGNOSIS — I10 Essential (primary) hypertension: Secondary | ICD-10-CM | POA: Diagnosis not present

## 2021-10-16 DIAGNOSIS — D509 Iron deficiency anemia, unspecified: Secondary | ICD-10-CM | POA: Diagnosis not present

## 2021-10-16 DIAGNOSIS — I509 Heart failure, unspecified: Secondary | ICD-10-CM | POA: Diagnosis not present

## 2021-10-16 DIAGNOSIS — M159 Polyosteoarthritis, unspecified: Secondary | ICD-10-CM | POA: Diagnosis not present

## 2021-10-16 LAB — METHYLMALONIC ACID, SERUM: Methylmalonic Acid, Quantitative: 158 nmol/L (ref 0–378)

## 2021-10-17 DIAGNOSIS — D509 Iron deficiency anemia, unspecified: Secondary | ICD-10-CM | POA: Diagnosis not present

## 2021-10-17 DIAGNOSIS — I5033 Acute on chronic diastolic (congestive) heart failure: Secondary | ICD-10-CM | POA: Diagnosis not present

## 2021-10-17 DIAGNOSIS — I890 Lymphedema, not elsewhere classified: Secondary | ICD-10-CM | POA: Diagnosis not present

## 2021-10-17 DIAGNOSIS — I110 Hypertensive heart disease with heart failure: Secondary | ICD-10-CM | POA: Diagnosis not present

## 2021-10-24 ENCOUNTER — Inpatient Hospital Stay: Payer: Medicare Other | Admitting: Physician Assistant

## 2021-10-28 DIAGNOSIS — D509 Iron deficiency anemia, unspecified: Secondary | ICD-10-CM | POA: Diagnosis not present

## 2021-10-28 DIAGNOSIS — I110 Hypertensive heart disease with heart failure: Secondary | ICD-10-CM | POA: Diagnosis not present

## 2021-10-28 DIAGNOSIS — I5033 Acute on chronic diastolic (congestive) heart failure: Secondary | ICD-10-CM | POA: Diagnosis not present

## 2021-10-28 DIAGNOSIS — I890 Lymphedema, not elsewhere classified: Secondary | ICD-10-CM | POA: Diagnosis not present

## 2021-10-30 DIAGNOSIS — Z515 Encounter for palliative care: Secondary | ICD-10-CM | POA: Diagnosis not present

## 2021-10-30 DIAGNOSIS — R609 Edema, unspecified: Secondary | ICD-10-CM | POA: Diagnosis not present

## 2021-10-30 DIAGNOSIS — I5032 Chronic diastolic (congestive) heart failure: Secondary | ICD-10-CM | POA: Diagnosis not present

## 2021-10-31 DIAGNOSIS — I890 Lymphedema, not elsewhere classified: Secondary | ICD-10-CM | POA: Diagnosis not present

## 2021-10-31 DIAGNOSIS — I5033 Acute on chronic diastolic (congestive) heart failure: Secondary | ICD-10-CM | POA: Diagnosis not present

## 2021-10-31 DIAGNOSIS — I110 Hypertensive heart disease with heart failure: Secondary | ICD-10-CM | POA: Diagnosis not present

## 2021-10-31 DIAGNOSIS — D509 Iron deficiency anemia, unspecified: Secondary | ICD-10-CM | POA: Diagnosis not present

## 2021-11-06 DIAGNOSIS — I890 Lymphedema, not elsewhere classified: Secondary | ICD-10-CM | POA: Diagnosis not present

## 2021-11-06 DIAGNOSIS — I5033 Acute on chronic diastolic (congestive) heart failure: Secondary | ICD-10-CM | POA: Diagnosis not present

## 2021-11-06 DIAGNOSIS — I110 Hypertensive heart disease with heart failure: Secondary | ICD-10-CM | POA: Diagnosis not present

## 2021-11-06 DIAGNOSIS — D509 Iron deficiency anemia, unspecified: Secondary | ICD-10-CM | POA: Diagnosis not present

## 2021-11-07 DIAGNOSIS — I5033 Acute on chronic diastolic (congestive) heart failure: Secondary | ICD-10-CM | POA: Diagnosis not present

## 2021-11-07 DIAGNOSIS — D509 Iron deficiency anemia, unspecified: Secondary | ICD-10-CM | POA: Diagnosis not present

## 2021-11-07 DIAGNOSIS — I890 Lymphedema, not elsewhere classified: Secondary | ICD-10-CM | POA: Diagnosis not present

## 2021-11-07 DIAGNOSIS — I110 Hypertensive heart disease with heart failure: Secondary | ICD-10-CM | POA: Diagnosis not present

## 2021-11-12 DIAGNOSIS — I890 Lymphedema, not elsewhere classified: Secondary | ICD-10-CM | POA: Diagnosis not present

## 2021-11-12 DIAGNOSIS — D509 Iron deficiency anemia, unspecified: Secondary | ICD-10-CM | POA: Diagnosis not present

## 2021-11-12 DIAGNOSIS — I5033 Acute on chronic diastolic (congestive) heart failure: Secondary | ICD-10-CM | POA: Diagnosis not present

## 2021-11-12 DIAGNOSIS — I110 Hypertensive heart disease with heart failure: Secondary | ICD-10-CM | POA: Diagnosis not present

## 2021-11-13 DIAGNOSIS — I5030 Unspecified diastolic (congestive) heart failure: Secondary | ICD-10-CM | POA: Diagnosis not present

## 2021-11-13 DIAGNOSIS — I890 Lymphedema, not elsewhere classified: Secondary | ICD-10-CM | POA: Diagnosis not present

## 2021-11-13 DIAGNOSIS — R011 Cardiac murmur, unspecified: Secondary | ICD-10-CM | POA: Diagnosis not present

## 2021-11-13 DIAGNOSIS — K2970 Gastritis, unspecified, without bleeding: Secondary | ICD-10-CM | POA: Diagnosis not present

## 2021-11-13 DIAGNOSIS — I10 Essential (primary) hypertension: Secondary | ICD-10-CM | POA: Diagnosis not present

## 2021-11-13 DIAGNOSIS — I4891 Unspecified atrial fibrillation: Secondary | ICD-10-CM | POA: Diagnosis not present

## 2021-11-15 DIAGNOSIS — I5033 Acute on chronic diastolic (congestive) heart failure: Secondary | ICD-10-CM | POA: Diagnosis not present

## 2021-11-15 DIAGNOSIS — D509 Iron deficiency anemia, unspecified: Secondary | ICD-10-CM | POA: Diagnosis not present

## 2021-11-15 DIAGNOSIS — I890 Lymphedema, not elsewhere classified: Secondary | ICD-10-CM | POA: Diagnosis not present

## 2021-11-15 DIAGNOSIS — I110 Hypertensive heart disease with heart failure: Secondary | ICD-10-CM | POA: Diagnosis not present

## 2021-11-17 DIAGNOSIS — I5033 Acute on chronic diastolic (congestive) heart failure: Secondary | ICD-10-CM | POA: Diagnosis not present

## 2021-11-18 DIAGNOSIS — M13811 Other specified arthritis, right shoulder: Secondary | ICD-10-CM | POA: Diagnosis not present

## 2021-11-18 DIAGNOSIS — M170 Bilateral primary osteoarthritis of knee: Secondary | ICD-10-CM | POA: Diagnosis not present

## 2021-11-20 DIAGNOSIS — I890 Lymphedema, not elsewhere classified: Secondary | ICD-10-CM | POA: Diagnosis not present

## 2021-11-20 DIAGNOSIS — M7989 Other specified soft tissue disorders: Secondary | ICD-10-CM | POA: Diagnosis not present

## 2021-11-20 DIAGNOSIS — M159 Polyosteoarthritis, unspecified: Secondary | ICD-10-CM | POA: Diagnosis not present

## 2021-11-20 DIAGNOSIS — I509 Heart failure, unspecified: Secondary | ICD-10-CM | POA: Diagnosis not present

## 2021-11-20 DIAGNOSIS — Z515 Encounter for palliative care: Secondary | ICD-10-CM | POA: Diagnosis not present

## 2021-11-20 DIAGNOSIS — D509 Iron deficiency anemia, unspecified: Secondary | ICD-10-CM | POA: Diagnosis not present

## 2021-11-20 DIAGNOSIS — Z23 Encounter for immunization: Secondary | ICD-10-CM | POA: Diagnosis not present

## 2021-11-20 DIAGNOSIS — Z0000 Encounter for general adult medical examination without abnormal findings: Secondary | ICD-10-CM | POA: Diagnosis not present

## 2021-11-20 DIAGNOSIS — G629 Polyneuropathy, unspecified: Secondary | ICD-10-CM | POA: Diagnosis not present

## 2021-11-20 DIAGNOSIS — I5032 Chronic diastolic (congestive) heart failure: Secondary | ICD-10-CM | POA: Diagnosis not present

## 2021-11-20 DIAGNOSIS — H6121 Impacted cerumen, right ear: Secondary | ICD-10-CM | POA: Diagnosis not present

## 2021-11-20 DIAGNOSIS — I10 Essential (primary) hypertension: Secondary | ICD-10-CM | POA: Diagnosis not present

## 2021-11-20 DIAGNOSIS — R609 Edema, unspecified: Secondary | ICD-10-CM | POA: Diagnosis not present

## 2021-11-22 DIAGNOSIS — I110 Hypertensive heart disease with heart failure: Secondary | ICD-10-CM | POA: Diagnosis not present

## 2021-11-22 DIAGNOSIS — I5033 Acute on chronic diastolic (congestive) heart failure: Secondary | ICD-10-CM | POA: Diagnosis not present

## 2021-11-22 DIAGNOSIS — I890 Lymphedema, not elsewhere classified: Secondary | ICD-10-CM | POA: Diagnosis not present

## 2021-11-22 DIAGNOSIS — D509 Iron deficiency anemia, unspecified: Secondary | ICD-10-CM | POA: Diagnosis not present

## 2021-11-26 DIAGNOSIS — I5033 Acute on chronic diastolic (congestive) heart failure: Secondary | ICD-10-CM | POA: Diagnosis not present

## 2021-11-26 DIAGNOSIS — I890 Lymphedema, not elsewhere classified: Secondary | ICD-10-CM | POA: Diagnosis not present

## 2021-11-26 DIAGNOSIS — D509 Iron deficiency anemia, unspecified: Secondary | ICD-10-CM | POA: Diagnosis not present

## 2021-11-26 DIAGNOSIS — I110 Hypertensive heart disease with heart failure: Secondary | ICD-10-CM | POA: Diagnosis not present

## 2021-11-28 DIAGNOSIS — I110 Hypertensive heart disease with heart failure: Secondary | ICD-10-CM | POA: Diagnosis not present

## 2021-11-28 DIAGNOSIS — I890 Lymphedema, not elsewhere classified: Secondary | ICD-10-CM | POA: Diagnosis not present

## 2021-11-28 DIAGNOSIS — D509 Iron deficiency anemia, unspecified: Secondary | ICD-10-CM | POA: Diagnosis not present

## 2021-11-28 DIAGNOSIS — I5033 Acute on chronic diastolic (congestive) heart failure: Secondary | ICD-10-CM | POA: Diagnosis not present

## 2021-12-02 DIAGNOSIS — K5904 Chronic idiopathic constipation: Secondary | ICD-10-CM | POA: Diagnosis not present

## 2021-12-02 DIAGNOSIS — K2100 Gastro-esophageal reflux disease with esophagitis, without bleeding: Secondary | ICD-10-CM | POA: Diagnosis not present

## 2021-12-03 DIAGNOSIS — D509 Iron deficiency anemia, unspecified: Secondary | ICD-10-CM | POA: Diagnosis not present

## 2021-12-03 DIAGNOSIS — I890 Lymphedema, not elsewhere classified: Secondary | ICD-10-CM | POA: Diagnosis not present

## 2021-12-03 DIAGNOSIS — I5033 Acute on chronic diastolic (congestive) heart failure: Secondary | ICD-10-CM | POA: Diagnosis not present

## 2021-12-03 DIAGNOSIS — I110 Hypertensive heart disease with heart failure: Secondary | ICD-10-CM | POA: Diagnosis not present

## 2021-12-05 DIAGNOSIS — D509 Iron deficiency anemia, unspecified: Secondary | ICD-10-CM | POA: Diagnosis not present

## 2021-12-05 DIAGNOSIS — I890 Lymphedema, not elsewhere classified: Secondary | ICD-10-CM | POA: Diagnosis not present

## 2021-12-05 DIAGNOSIS — I110 Hypertensive heart disease with heart failure: Secondary | ICD-10-CM | POA: Diagnosis not present

## 2021-12-05 DIAGNOSIS — I5033 Acute on chronic diastolic (congestive) heart failure: Secondary | ICD-10-CM | POA: Diagnosis not present

## 2021-12-06 DIAGNOSIS — I110 Hypertensive heart disease with heart failure: Secondary | ICD-10-CM | POA: Diagnosis not present

## 2021-12-06 DIAGNOSIS — D509 Iron deficiency anemia, unspecified: Secondary | ICD-10-CM | POA: Diagnosis not present

## 2021-12-06 DIAGNOSIS — I890 Lymphedema, not elsewhere classified: Secondary | ICD-10-CM | POA: Diagnosis not present

## 2021-12-06 DIAGNOSIS — I5033 Acute on chronic diastolic (congestive) heart failure: Secondary | ICD-10-CM | POA: Diagnosis not present

## 2021-12-09 ENCOUNTER — Ambulatory Visit: Payer: Medicare HMO | Admitting: Internal Medicine

## 2021-12-11 DIAGNOSIS — D509 Iron deficiency anemia, unspecified: Secondary | ICD-10-CM | POA: Diagnosis not present

## 2021-12-11 DIAGNOSIS — I110 Hypertensive heart disease with heart failure: Secondary | ICD-10-CM | POA: Diagnosis not present

## 2021-12-11 DIAGNOSIS — I890 Lymphedema, not elsewhere classified: Secondary | ICD-10-CM | POA: Diagnosis not present

## 2021-12-11 DIAGNOSIS — I5033 Acute on chronic diastolic (congestive) heart failure: Secondary | ICD-10-CM | POA: Diagnosis not present

## 2021-12-12 DIAGNOSIS — I890 Lymphedema, not elsewhere classified: Secondary | ICD-10-CM | POA: Diagnosis not present

## 2021-12-12 DIAGNOSIS — I5033 Acute on chronic diastolic (congestive) heart failure: Secondary | ICD-10-CM | POA: Diagnosis not present

## 2021-12-12 DIAGNOSIS — D509 Iron deficiency anemia, unspecified: Secondary | ICD-10-CM | POA: Diagnosis not present

## 2021-12-12 DIAGNOSIS — I110 Hypertensive heart disease with heart failure: Secondary | ICD-10-CM | POA: Diagnosis not present

## 2021-12-17 DIAGNOSIS — I5033 Acute on chronic diastolic (congestive) heart failure: Secondary | ICD-10-CM | POA: Diagnosis not present

## 2021-12-18 DIAGNOSIS — I110 Hypertensive heart disease with heart failure: Secondary | ICD-10-CM | POA: Diagnosis not present

## 2021-12-18 DIAGNOSIS — Z515 Encounter for palliative care: Secondary | ICD-10-CM | POA: Diagnosis not present

## 2021-12-18 DIAGNOSIS — I5033 Acute on chronic diastolic (congestive) heart failure: Secondary | ICD-10-CM | POA: Diagnosis not present

## 2021-12-18 DIAGNOSIS — I890 Lymphedema, not elsewhere classified: Secondary | ICD-10-CM | POA: Diagnosis not present

## 2021-12-18 DIAGNOSIS — I5032 Chronic diastolic (congestive) heart failure: Secondary | ICD-10-CM | POA: Diagnosis not present

## 2021-12-18 DIAGNOSIS — D509 Iron deficiency anemia, unspecified: Secondary | ICD-10-CM | POA: Diagnosis not present

## 2021-12-18 DIAGNOSIS — R609 Edema, unspecified: Secondary | ICD-10-CM | POA: Diagnosis not present

## 2021-12-19 DIAGNOSIS — I5033 Acute on chronic diastolic (congestive) heart failure: Secondary | ICD-10-CM | POA: Diagnosis not present

## 2021-12-19 DIAGNOSIS — D509 Iron deficiency anemia, unspecified: Secondary | ICD-10-CM | POA: Diagnosis not present

## 2021-12-19 DIAGNOSIS — I110 Hypertensive heart disease with heart failure: Secondary | ICD-10-CM | POA: Diagnosis not present

## 2021-12-19 DIAGNOSIS — I890 Lymphedema, not elsewhere classified: Secondary | ICD-10-CM | POA: Diagnosis not present

## 2021-12-20 DIAGNOSIS — I890 Lymphedema, not elsewhere classified: Secondary | ICD-10-CM | POA: Diagnosis not present

## 2021-12-20 DIAGNOSIS — D509 Iron deficiency anemia, unspecified: Secondary | ICD-10-CM | POA: Diagnosis not present

## 2021-12-20 DIAGNOSIS — I5033 Acute on chronic diastolic (congestive) heart failure: Secondary | ICD-10-CM | POA: Diagnosis not present

## 2021-12-20 DIAGNOSIS — I110 Hypertensive heart disease with heart failure: Secondary | ICD-10-CM | POA: Diagnosis not present

## 2021-12-24 DIAGNOSIS — I890 Lymphedema, not elsewhere classified: Secondary | ICD-10-CM | POA: Diagnosis not present

## 2021-12-24 DIAGNOSIS — D509 Iron deficiency anemia, unspecified: Secondary | ICD-10-CM | POA: Diagnosis not present

## 2021-12-24 DIAGNOSIS — I110 Hypertensive heart disease with heart failure: Secondary | ICD-10-CM | POA: Diagnosis not present

## 2021-12-24 DIAGNOSIS — I5033 Acute on chronic diastolic (congestive) heart failure: Secondary | ICD-10-CM | POA: Diagnosis not present

## 2021-12-27 DIAGNOSIS — I110 Hypertensive heart disease with heart failure: Secondary | ICD-10-CM | POA: Diagnosis not present

## 2021-12-27 DIAGNOSIS — D509 Iron deficiency anemia, unspecified: Secondary | ICD-10-CM | POA: Diagnosis not present

## 2021-12-27 DIAGNOSIS — I5033 Acute on chronic diastolic (congestive) heart failure: Secondary | ICD-10-CM | POA: Diagnosis not present

## 2021-12-27 DIAGNOSIS — I890 Lymphedema, not elsewhere classified: Secondary | ICD-10-CM | POA: Diagnosis not present

## 2021-12-30 ENCOUNTER — Inpatient Hospital Stay: Payer: Medicare Other

## 2021-12-30 ENCOUNTER — Other Ambulatory Visit: Payer: Self-pay | Admitting: Hematology and Oncology

## 2021-12-30 ENCOUNTER — Inpatient Hospital Stay: Payer: Medicare Other | Admitting: Hematology and Oncology

## 2021-12-30 DIAGNOSIS — D500 Iron deficiency anemia secondary to blood loss (chronic): Secondary | ICD-10-CM

## 2021-12-31 DIAGNOSIS — D509 Iron deficiency anemia, unspecified: Secondary | ICD-10-CM | POA: Diagnosis not present

## 2021-12-31 DIAGNOSIS — I5033 Acute on chronic diastolic (congestive) heart failure: Secondary | ICD-10-CM | POA: Diagnosis not present

## 2021-12-31 DIAGNOSIS — I110 Hypertensive heart disease with heart failure: Secondary | ICD-10-CM | POA: Diagnosis not present

## 2021-12-31 DIAGNOSIS — I890 Lymphedema, not elsewhere classified: Secondary | ICD-10-CM | POA: Diagnosis not present

## 2022-01-02 DIAGNOSIS — I890 Lymphedema, not elsewhere classified: Secondary | ICD-10-CM | POA: Diagnosis not present

## 2022-01-02 DIAGNOSIS — I5033 Acute on chronic diastolic (congestive) heart failure: Secondary | ICD-10-CM | POA: Diagnosis not present

## 2022-01-02 DIAGNOSIS — D509 Iron deficiency anemia, unspecified: Secondary | ICD-10-CM | POA: Diagnosis not present

## 2022-01-02 DIAGNOSIS — I110 Hypertensive heart disease with heart failure: Secondary | ICD-10-CM | POA: Diagnosis not present

## 2022-01-07 DIAGNOSIS — I890 Lymphedema, not elsewhere classified: Secondary | ICD-10-CM | POA: Diagnosis not present

## 2022-01-07 DIAGNOSIS — I5033 Acute on chronic diastolic (congestive) heart failure: Secondary | ICD-10-CM | POA: Diagnosis not present

## 2022-01-07 DIAGNOSIS — D509 Iron deficiency anemia, unspecified: Secondary | ICD-10-CM | POA: Diagnosis not present

## 2022-01-07 DIAGNOSIS — I110 Hypertensive heart disease with heart failure: Secondary | ICD-10-CM | POA: Diagnosis not present

## 2022-01-08 DIAGNOSIS — K2100 Gastro-esophageal reflux disease with esophagitis, without bleeding: Secondary | ICD-10-CM | POA: Diagnosis not present

## 2022-01-08 DIAGNOSIS — D509 Iron deficiency anemia, unspecified: Secondary | ICD-10-CM | POA: Diagnosis not present

## 2022-01-08 DIAGNOSIS — K5904 Chronic idiopathic constipation: Secondary | ICD-10-CM | POA: Diagnosis not present

## 2022-01-09 DIAGNOSIS — I890 Lymphedema, not elsewhere classified: Secondary | ICD-10-CM | POA: Diagnosis not present

## 2022-01-09 DIAGNOSIS — I110 Hypertensive heart disease with heart failure: Secondary | ICD-10-CM | POA: Diagnosis not present

## 2022-01-09 DIAGNOSIS — D509 Iron deficiency anemia, unspecified: Secondary | ICD-10-CM | POA: Diagnosis not present

## 2022-01-09 DIAGNOSIS — I5033 Acute on chronic diastolic (congestive) heart failure: Secondary | ICD-10-CM | POA: Diagnosis not present

## 2022-01-13 DIAGNOSIS — I890 Lymphedema, not elsewhere classified: Secondary | ICD-10-CM | POA: Diagnosis not present

## 2022-01-13 DIAGNOSIS — D509 Iron deficiency anemia, unspecified: Secondary | ICD-10-CM | POA: Diagnosis not present

## 2022-01-13 DIAGNOSIS — I110 Hypertensive heart disease with heart failure: Secondary | ICD-10-CM | POA: Diagnosis not present

## 2022-01-13 DIAGNOSIS — I5033 Acute on chronic diastolic (congestive) heart failure: Secondary | ICD-10-CM | POA: Diagnosis not present

## 2022-01-16 DIAGNOSIS — D509 Iron deficiency anemia, unspecified: Secondary | ICD-10-CM | POA: Diagnosis not present

## 2022-01-16 DIAGNOSIS — I890 Lymphedema, not elsewhere classified: Secondary | ICD-10-CM | POA: Diagnosis not present

## 2022-01-16 DIAGNOSIS — I110 Hypertensive heart disease with heart failure: Secondary | ICD-10-CM | POA: Diagnosis not present

## 2022-01-16 DIAGNOSIS — I5033 Acute on chronic diastolic (congestive) heart failure: Secondary | ICD-10-CM | POA: Diagnosis not present

## 2022-01-17 DIAGNOSIS — I5033 Acute on chronic diastolic (congestive) heart failure: Secondary | ICD-10-CM | POA: Diagnosis not present

## 2022-01-21 DIAGNOSIS — I890 Lymphedema, not elsewhere classified: Secondary | ICD-10-CM | POA: Diagnosis not present

## 2022-01-21 DIAGNOSIS — I110 Hypertensive heart disease with heart failure: Secondary | ICD-10-CM | POA: Diagnosis not present

## 2022-01-21 DIAGNOSIS — D509 Iron deficiency anemia, unspecified: Secondary | ICD-10-CM | POA: Diagnosis not present

## 2022-01-21 DIAGNOSIS — I5033 Acute on chronic diastolic (congestive) heart failure: Secondary | ICD-10-CM | POA: Diagnosis not present

## 2022-01-23 DIAGNOSIS — I890 Lymphedema, not elsewhere classified: Secondary | ICD-10-CM | POA: Diagnosis not present

## 2022-01-23 DIAGNOSIS — I110 Hypertensive heart disease with heart failure: Secondary | ICD-10-CM | POA: Diagnosis not present

## 2022-01-23 DIAGNOSIS — I5033 Acute on chronic diastolic (congestive) heart failure: Secondary | ICD-10-CM | POA: Diagnosis not present

## 2022-01-23 DIAGNOSIS — D509 Iron deficiency anemia, unspecified: Secondary | ICD-10-CM | POA: Diagnosis not present

## 2022-01-28 DIAGNOSIS — I110 Hypertensive heart disease with heart failure: Secondary | ICD-10-CM | POA: Diagnosis not present

## 2022-01-28 DIAGNOSIS — I5033 Acute on chronic diastolic (congestive) heart failure: Secondary | ICD-10-CM | POA: Diagnosis not present

## 2022-01-28 DIAGNOSIS — I890 Lymphedema, not elsewhere classified: Secondary | ICD-10-CM | POA: Diagnosis not present

## 2022-01-28 DIAGNOSIS — D509 Iron deficiency anemia, unspecified: Secondary | ICD-10-CM | POA: Diagnosis not present

## 2022-01-30 DIAGNOSIS — I5033 Acute on chronic diastolic (congestive) heart failure: Secondary | ICD-10-CM | POA: Diagnosis not present

## 2022-01-30 DIAGNOSIS — I110 Hypertensive heart disease with heart failure: Secondary | ICD-10-CM | POA: Diagnosis not present

## 2022-01-30 DIAGNOSIS — I890 Lymphedema, not elsewhere classified: Secondary | ICD-10-CM | POA: Diagnosis not present

## 2022-01-30 DIAGNOSIS — D509 Iron deficiency anemia, unspecified: Secondary | ICD-10-CM | POA: Diagnosis not present

## 2022-02-03 DIAGNOSIS — D509 Iron deficiency anemia, unspecified: Secondary | ICD-10-CM | POA: Diagnosis not present

## 2022-02-03 DIAGNOSIS — I5033 Acute on chronic diastolic (congestive) heart failure: Secondary | ICD-10-CM | POA: Diagnosis not present

## 2022-02-03 DIAGNOSIS — I110 Hypertensive heart disease with heart failure: Secondary | ICD-10-CM | POA: Diagnosis not present

## 2022-02-03 DIAGNOSIS — I890 Lymphedema, not elsewhere classified: Secondary | ICD-10-CM | POA: Diagnosis not present

## 2022-02-16 DIAGNOSIS — I5033 Acute on chronic diastolic (congestive) heart failure: Secondary | ICD-10-CM | POA: Diagnosis not present

## 2022-02-24 DIAGNOSIS — I5033 Acute on chronic diastolic (congestive) heart failure: Secondary | ICD-10-CM | POA: Diagnosis not present

## 2022-03-04 DIAGNOSIS — Z515 Encounter for palliative care: Secondary | ICD-10-CM | POA: Diagnosis not present

## 2022-03-04 DIAGNOSIS — I5032 Chronic diastolic (congestive) heart failure: Secondary | ICD-10-CM | POA: Diagnosis not present

## 2022-03-04 DIAGNOSIS — R609 Edema, unspecified: Secondary | ICD-10-CM | POA: Diagnosis not present

## 2022-03-19 DIAGNOSIS — I5033 Acute on chronic diastolic (congestive) heart failure: Secondary | ICD-10-CM | POA: Diagnosis not present

## 2022-03-24 DIAGNOSIS — I509 Heart failure, unspecified: Secondary | ICD-10-CM | POA: Diagnosis not present

## 2022-03-24 DIAGNOSIS — M7989 Other specified soft tissue disorders: Secondary | ICD-10-CM | POA: Diagnosis not present

## 2022-03-24 DIAGNOSIS — I890 Lymphedema, not elsewhere classified: Secondary | ICD-10-CM | POA: Diagnosis not present

## 2022-03-24 DIAGNOSIS — I10 Essential (primary) hypertension: Secondary | ICD-10-CM | POA: Diagnosis not present

## 2022-03-24 DIAGNOSIS — R350 Frequency of micturition: Secondary | ICD-10-CM | POA: Diagnosis not present

## 2022-03-24 DIAGNOSIS — R300 Dysuria: Secondary | ICD-10-CM | POA: Diagnosis not present

## 2022-03-24 DIAGNOSIS — M159 Polyosteoarthritis, unspecified: Secondary | ICD-10-CM | POA: Diagnosis not present

## 2022-03-24 DIAGNOSIS — D509 Iron deficiency anemia, unspecified: Secondary | ICD-10-CM | POA: Diagnosis not present

## 2022-03-24 DIAGNOSIS — G629 Polyneuropathy, unspecified: Secondary | ICD-10-CM | POA: Diagnosis not present

## 2022-03-24 DIAGNOSIS — K219 Gastro-esophageal reflux disease without esophagitis: Secondary | ICD-10-CM | POA: Diagnosis not present

## 2022-03-26 DIAGNOSIS — E785 Hyperlipidemia, unspecified: Secondary | ICD-10-CM | POA: Diagnosis not present

## 2022-03-26 DIAGNOSIS — G629 Polyneuropathy, unspecified: Secondary | ICD-10-CM | POA: Diagnosis not present

## 2022-03-26 DIAGNOSIS — D649 Anemia, unspecified: Secondary | ICD-10-CM | POA: Diagnosis not present

## 2022-03-26 DIAGNOSIS — I10 Essential (primary) hypertension: Secondary | ICD-10-CM | POA: Diagnosis not present

## 2022-04-09 DIAGNOSIS — Z515 Encounter for palliative care: Secondary | ICD-10-CM | POA: Diagnosis not present

## 2022-04-09 DIAGNOSIS — I5032 Chronic diastolic (congestive) heart failure: Secondary | ICD-10-CM | POA: Diagnosis not present

## 2022-04-09 DIAGNOSIS — R609 Edema, unspecified: Secondary | ICD-10-CM | POA: Diagnosis not present

## 2022-04-19 DIAGNOSIS — I5033 Acute on chronic diastolic (congestive) heart failure: Secondary | ICD-10-CM | POA: Diagnosis not present

## 2022-05-19 DIAGNOSIS — I5033 Acute on chronic diastolic (congestive) heart failure: Secondary | ICD-10-CM | POA: Diagnosis not present

## 2022-06-19 DIAGNOSIS — I5033 Acute on chronic diastolic (congestive) heart failure: Secondary | ICD-10-CM | POA: Diagnosis not present

## 2022-07-03 ENCOUNTER — Emergency Department (HOSPITAL_COMMUNITY): Payer: Medicare Other

## 2022-07-03 ENCOUNTER — Other Ambulatory Visit: Payer: Self-pay

## 2022-07-03 ENCOUNTER — Inpatient Hospital Stay (HOSPITAL_COMMUNITY)
Admission: EM | Admit: 2022-07-03 | Discharge: 2022-07-15 | DRG: 177 | Disposition: A | Discharge status: Skilled Nursing Facility | Payer: Medicare Other | Attending: Internal Medicine | Admitting: Internal Medicine

## 2022-07-03 ENCOUNTER — Encounter (HOSPITAL_COMMUNITY): Payer: Self-pay

## 2022-07-03 DIAGNOSIS — R531 Weakness: Secondary | ICD-10-CM

## 2022-07-03 DIAGNOSIS — I890 Lymphedema, not elsewhere classified: Secondary | ICD-10-CM | POA: Diagnosis not present

## 2022-07-03 DIAGNOSIS — W44F9XA Other object of natural or organic material, entering into or through a natural orifice, initial encounter: Secondary | ICD-10-CM | POA: Diagnosis present

## 2022-07-03 DIAGNOSIS — M19012 Primary osteoarthritis, left shoulder: Secondary | ICD-10-CM | POA: Diagnosis not present

## 2022-07-03 DIAGNOSIS — I5032 Chronic diastolic (congestive) heart failure: Secondary | ICD-10-CM | POA: Diagnosis present

## 2022-07-03 DIAGNOSIS — I517 Cardiomegaly: Secondary | ICD-10-CM | POA: Diagnosis not present

## 2022-07-03 DIAGNOSIS — Z884 Allergy status to anesthetic agent status: Secondary | ICD-10-CM | POA: Diagnosis not present

## 2022-07-03 DIAGNOSIS — E871 Hypo-osmolality and hyponatremia: Secondary | ICD-10-CM | POA: Diagnosis not present

## 2022-07-03 DIAGNOSIS — J9601 Acute respiratory failure with hypoxia: Secondary | ICD-10-CM | POA: Diagnosis present

## 2022-07-03 DIAGNOSIS — Z79899 Other long term (current) drug therapy: Secondary | ICD-10-CM

## 2022-07-03 DIAGNOSIS — Z9010 Acquired absence of unspecified breast and nipple: Secondary | ICD-10-CM | POA: Diagnosis not present

## 2022-07-03 DIAGNOSIS — Z8673 Personal history of transient ischemic attack (TIA), and cerebral infarction without residual deficits: Secondary | ICD-10-CM | POA: Diagnosis not present

## 2022-07-03 DIAGNOSIS — K219 Gastro-esophageal reflux disease without esophagitis: Secondary | ICD-10-CM | POA: Diagnosis not present

## 2022-07-03 DIAGNOSIS — N1330 Unspecified hydronephrosis: Secondary | ICD-10-CM | POA: Diagnosis not present

## 2022-07-03 DIAGNOSIS — I10 Essential (primary) hypertension: Secondary | ICD-10-CM | POA: Diagnosis present

## 2022-07-03 DIAGNOSIS — N179 Acute kidney failure, unspecified: Secondary | ICD-10-CM | POA: Diagnosis present

## 2022-07-03 DIAGNOSIS — I110 Hypertensive heart disease with heart failure: Secondary | ICD-10-CM | POA: Diagnosis not present

## 2022-07-03 DIAGNOSIS — D649 Anemia, unspecified: Secondary | ICD-10-CM | POA: Diagnosis present

## 2022-07-03 DIAGNOSIS — J1282 Pneumonia due to coronavirus disease 2019: Secondary | ICD-10-CM | POA: Diagnosis not present

## 2022-07-03 DIAGNOSIS — R791 Abnormal coagulation profile: Secondary | ICD-10-CM | POA: Diagnosis not present

## 2022-07-03 DIAGNOSIS — R059 Cough, unspecified: Secondary | ICD-10-CM | POA: Diagnosis not present

## 2022-07-03 DIAGNOSIS — E876 Hypokalemia: Secondary | ICD-10-CM | POA: Diagnosis not present

## 2022-07-03 DIAGNOSIS — R54 Age-related physical debility: Secondary | ICD-10-CM | POA: Diagnosis not present

## 2022-07-03 DIAGNOSIS — Z66 Do not resuscitate: Secondary | ICD-10-CM | POA: Diagnosis not present

## 2022-07-03 DIAGNOSIS — R1110 Vomiting, unspecified: Secondary | ICD-10-CM | POA: Diagnosis not present

## 2022-07-03 DIAGNOSIS — Z853 Personal history of malignant neoplasm of breast: Secondary | ICD-10-CM | POA: Diagnosis not present

## 2022-07-03 DIAGNOSIS — R0902 Hypoxemia: Secondary | ICD-10-CM | POA: Diagnosis not present

## 2022-07-03 DIAGNOSIS — Z9013 Acquired absence of bilateral breasts and nipples: Secondary | ICD-10-CM

## 2022-07-03 DIAGNOSIS — Z880 Allergy status to penicillin: Secondary | ICD-10-CM | POA: Diagnosis not present

## 2022-07-03 DIAGNOSIS — I700 Atherosclerosis of aorta: Secondary | ICD-10-CM | POA: Diagnosis not present

## 2022-07-03 DIAGNOSIS — T17918A Gastric contents in respiratory tract, part unspecified causing other injury, initial encounter: Secondary | ICD-10-CM | POA: Diagnosis present

## 2022-07-03 DIAGNOSIS — R0602 Shortness of breath: Secondary | ICD-10-CM | POA: Diagnosis not present

## 2022-07-03 DIAGNOSIS — U071 COVID-19: Secondary | ICD-10-CM | POA: Diagnosis not present

## 2022-07-03 DIAGNOSIS — R7989 Other specified abnormal findings of blood chemistry: Secondary | ICD-10-CM | POA: Diagnosis not present

## 2022-07-03 DIAGNOSIS — E860 Dehydration: Secondary | ICD-10-CM | POA: Diagnosis not present

## 2022-07-03 DIAGNOSIS — R112 Nausea with vomiting, unspecified: Secondary | ICD-10-CM | POA: Diagnosis not present

## 2022-07-03 DIAGNOSIS — K5900 Constipation, unspecified: Secondary | ICD-10-CM | POA: Diagnosis not present

## 2022-07-03 DIAGNOSIS — J690 Pneumonitis due to inhalation of food and vomit: Secondary | ICD-10-CM | POA: Diagnosis not present

## 2022-07-03 DIAGNOSIS — Z7401 Bed confinement status: Secondary | ICD-10-CM | POA: Diagnosis not present

## 2022-07-03 DIAGNOSIS — M19011 Primary osteoarthritis, right shoulder: Secondary | ICD-10-CM | POA: Diagnosis not present

## 2022-07-03 LAB — CBC WITH DIFFERENTIAL/PLATELET
Abs Immature Granulocytes: 0.01 10*3/uL (ref 0.00–0.07)
Basophils Absolute: 0 10*3/uL (ref 0.0–0.1)
Basophils Relative: 0 %
Eosinophils Absolute: 0 10*3/uL (ref 0.0–0.5)
Eosinophils Relative: 0 %
HCT: 27.4 % — ABNORMAL LOW (ref 36.0–46.0)
Hemoglobin: 9.2 g/dL — ABNORMAL LOW (ref 12.0–15.0)
Immature Granulocytes: 0 %
Lymphocytes Relative: 24 %
Lymphs Abs: 0.7 10*3/uL (ref 0.7–4.0)
MCH: 27.5 pg (ref 26.0–34.0)
MCHC: 33.6 g/dL (ref 30.0–36.0)
MCV: 82 fL (ref 80.0–100.0)
Monocytes Absolute: 0.5 10*3/uL (ref 0.1–1.0)
Monocytes Relative: 15 %
Neutro Abs: 1.8 10*3/uL (ref 1.7–7.7)
Neutrophils Relative %: 61 %
Platelets: 193 10*3/uL (ref 150–400)
RBC: 3.34 MIL/uL — ABNORMAL LOW (ref 3.87–5.11)
RDW: 13.9 % (ref 11.5–15.5)
WBC: 3 10*3/uL — ABNORMAL LOW (ref 4.0–10.5)
nRBC: 0 % (ref 0.0–0.2)

## 2022-07-03 LAB — COMPREHENSIVE METABOLIC PANEL
ALT: 12 U/L (ref 0–44)
AST: 23 U/L (ref 15–41)
Albumin: 3 g/dL — ABNORMAL LOW (ref 3.5–5.0)
Alkaline Phosphatase: 67 U/L (ref 38–126)
Anion gap: 12 (ref 5–15)
BUN: 15 mg/dL (ref 8–23)
CO2: 26 mmol/L (ref 22–32)
Calcium: 9 mg/dL (ref 8.9–10.3)
Chloride: 92 mmol/L — ABNORMAL LOW (ref 98–111)
Creatinine, Ser: 0.99 mg/dL (ref 0.44–1.00)
GFR, Estimated: 53 mL/min — ABNORMAL LOW (ref >60)
Glucose, Bld: 97 mg/dL (ref 70–99)
Potassium: 3.9 mmol/L (ref 3.5–5.1)
Sodium: 130 mmol/L — ABNORMAL LOW (ref 135–145)
Total Bilirubin: 0.8 mg/dL (ref 0.3–1.2)
Total Protein: 6.9 g/dL (ref 6.5–8.1)

## 2022-07-03 LAB — RESP PANEL BY RT-PCR (FLU A&B, COVID) ARPGX2
Influenza A by PCR: NEGATIVE
Influenza B by PCR: NEGATIVE
SARS Coronavirus 2 by RT PCR: POSITIVE — AB

## 2022-07-03 LAB — BRAIN NATRIURETIC PEPTIDE: B Natriuretic Peptide: 166.5 pg/mL — ABNORMAL HIGH (ref 0.0–100.0)

## 2022-07-03 LAB — LACTIC ACID, PLASMA
Lactic Acid, Venous: 0.8 mmol/L (ref 0.5–1.9)
Lactic Acid, Venous: 0.9 mmol/L (ref 0.5–1.9)

## 2022-07-03 LAB — TROPONIN I (HIGH SENSITIVITY)
Troponin I (High Sensitivity): 33 ng/L — ABNORMAL HIGH (ref <18)
Troponin I (High Sensitivity): 30 ng/L — ABNORMAL HIGH (ref <18)

## 2022-07-03 MED ORDER — ACETAMINOPHEN 650 MG RE SUPP: 650.0000 mg | Freq: Four times a day (QID) | RECTAL | Status: DC | PRN

## 2022-07-03 MED ORDER — ENOXAPARIN SODIUM 40 MG/0.4ML IJ SOSY
40.0000 mg | PREFILLED_SYRINGE | INTRAMUSCULAR | Status: DC
  Administered 2022-07-03 – 2022-07-14 (×12): 40 mg via SUBCUTANEOUS
  Filled 2022-07-03 (×12): qty 0.4

## 2022-07-03 MED ORDER — PANTOPRAZOLE SODIUM 40 MG PO TBEC
40.0000 mg | DELAYED_RELEASE_TABLET | Freq: Every day | ORAL | Status: DC
  Administered 2022-07-04 – 2022-07-15 (×12): 40 mg via ORAL
  Filled 2022-07-03 (×13): qty 1

## 2022-07-03 MED ORDER — FUROSEMIDE 20 MG PO TABS: 20.0000 mg | ORAL_TABLET | ORAL | Status: DC

## 2022-07-03 MED ORDER — IRBESARTAN 75 MG PO TABS
37.5000 mg | ORAL_TABLET | Freq: Every day | ORAL | Status: DC
  Administered 2022-07-04: 37.5 mg via ORAL
  Filled 2022-07-03: qty 0.5

## 2022-07-03 MED ORDER — ACETAMINOPHEN 325 MG PO TABS
650.0000 mg | ORAL_TABLET | Freq: Four times a day (QID) | ORAL | Status: DC | PRN
  Administered 2022-07-06 – 2022-07-14 (×8): 650 mg via ORAL
  Filled 2022-07-03 (×9): qty 2

## 2022-07-03 MED ORDER — POLYETHYLENE GLYCOL 3350 17 G PO PACK: 17.0000 g | PACK | Freq: Every day | ORAL | Status: DC | PRN

## 2022-07-03 MED ORDER — ONDANSETRON HCL 4 MG/2ML IJ SOLN
4.0000 mg | Freq: Four times a day (QID) | INTRAMUSCULAR | Status: AC | PRN
  Administered 2022-07-03 – 2022-07-05 (×4): 4 mg via INTRAVENOUS
  Filled 2022-07-03 (×4): qty 2

## 2022-07-03 MED ORDER — FUROSEMIDE 40 MG PO TABS
40.0000 mg | ORAL_TABLET | Freq: Every day | ORAL | Status: DC
  Administered 2022-07-04 – 2022-07-05 (×2): 40 mg via ORAL
  Filled 2022-07-03 (×2): qty 1

## 2022-07-03 MED ORDER — SODIUM CHLORIDE 0.9% FLUSH
3.0000 mL | Freq: Two times a day (BID) | INTRAVENOUS | Status: DC
  Administered 2022-07-03 – 2022-07-15 (×22): 3 mL via INTRAVENOUS

## 2022-07-03 MED ORDER — NIRMATRELVIR/RITONAVIR (PAXLOVID) TABLET (RENAL DOSING)
2.0000 | ORAL_TABLET | Freq: Two times a day (BID) | ORAL | Status: AC
  Administered 2022-07-03 – 2022-07-06 (×6): 2 via ORAL
  Filled 2022-07-03: qty 20

## 2022-07-03 MED ORDER — FUROSEMIDE 20 MG PO TABS: 40.0000 mg | ORAL_TABLET | ORAL | Status: DC

## 2022-07-03 MED ORDER — NIRMATRELVIR/RITONAVIR (PAXLOVID)TABLET: 3.0000 | ORAL_TABLET | Freq: Two times a day (BID) | ORAL | Status: DC

## 2022-07-03 MED ORDER — LACTATED RINGERS IV SOLN: INTRAVENOUS | Status: DC

## 2022-07-03 MED ORDER — METOPROLOL SUCCINATE ER 25 MG PO TB24
25.0000 mg | ORAL_TABLET | Freq: Every day | ORAL | Status: DC
  Administered 2022-07-04 – 2022-07-08 (×5): 25 mg via ORAL
  Filled 2022-07-03 (×5): qty 1

## 2022-07-03 NOTE — Progress Notes (Signed)
Pt arrived from ED to unit. Wilhemena Durie, RN and Mikeal Hawthorne, Agricultural consultant received and settled pt. Tele called by Gari Crown, RN.

## 2022-07-03 NOTE — ED Triage Notes (Addendum)
Pt arrived POV from home after testing positive for COVID. Per daughter yesterday when pt test positive she had irregular breathing, tachycardia, fever and weakness. Pt's daughter states she understands these are normal COVID symptoms but because of those symptoms she cannot take care of her at home in this state it is beyond her care.

## 2022-07-03 NOTE — H&P (Addendum)
History and Physical   Cassandra Wu VOH:[id number] DOB: [birth year] DOA: 07/03/2022  PCP: Pcp, No   Patient coming from: Home  Chief Complaint: COVID-19, generalized weakness  HPI: Cassandra Wu is a 88 y.o. female with medical history significant of hypertension, TIA, anemia, diastolic CHF, lymphedema, history of mastectomy, history of breast cancer, history of GI bleed presenting with COVID and generalized decline.  Patient became unwell 2 days ago.  Has had cough, fever as high as 102 yesterday, unable to walk today unaided.  Reporting some mild shortness of breath and decreased p.o. intake.  As above she was diagnosed with COVID outpatient and started on Paxlovid last night and has received 2 doses.  Family unable to care for patient given her weakness and decline with COVID-19.  Denies chills, chest pain, abdominal pain, constipation, diarrhea, nausea, vomiting.  ED Course: Vital signs in the ED significant for respirate in the 20s, blood pressure in the 694W to 546E systolic.  Lab workup included CMP with sodium stable at 130, chloride 92, albumin 3.0.  CBC with leukopenia at 3.0 and hemoglobin stable at 9.2.  Troponin mildly elevated at 33 with repeat pending.  Lactic acid normal with repeat pending.  BNP pending.  Respiratory panel for flu and COVID to confirm pending.  Patient received IV fluids in the ED.  Review of Systems: As per HPI otherwise all other systems reviewed and are negative.  Past Medical History:  Diagnosis Date   Breast cancer [facility])    Carpal tunnel syndrome    Hypertension     Past Surgical History:  Procedure Laterality Date   BIOPSY  08/13/2021   Procedure: BIOPSY;  Surgeon: Irene Shipper, MD;  Location: [facility] ENDOSCOPY;  Service: Endoscopy;;   ESOPHAGOGASTRODUODENOSCOPY (EGD) WITH PROPOFOL N/A 08/13/2021   Procedure: ESOPHAGOGASTRODUODENOSCOPY (EGD) WITH PROPOFOL;  Surgeon: Irene Shipper, MD;  Location: [facility] ENDOSCOPY;  Service: Endoscopy;   Laterality: N/A;   left breast mastectomy (other)     TONSILLECTOMY      Social History  reports that she has never smoked. She has never used smokeless tobacco. She reports that she does not drink alcohol. No history on file for drug use.  Allergies  Allergen Reactions   Penicillins    Propranolol Hcl     History reviewed. No pertinent family history.   Prior to Admission medications   Medication Sig Start Date End Date Taking? Authorizing Provider  ALPRAZolam (XANAX) 0.25 MG tablet Take 1 tablet (0.25 mg total) by mouth 2 (two) times daily as needed for anxiety. 08/14/21   Mercy Riding, MD  Cholecalciferol (D3 PO) Take 1 capsule by mouth daily.    [provider]  Cyanocobalamin (B-12 PO) Take 1 tablet by mouth daily.    [provider]  Dexlansoprazole 30 MG capsule DR Take 30 mg by mouth daily.    [provider]  diclofenac Sodium (VOLTAREN) 1 % GEL Apply topically 4 (four) times daily.    [provider]  ferrous sulfate 325 (65 FE) MG tablet Take 1 tablet (325 mg total) by mouth 2 (two) times daily with a meal. 08/14/21   Gonfa, Charlesetta Ivory, MD  furosemide (LASIX) 20 MG tablet Take 2 tablets (40 mg total) by mouth daily for 7 days, THEN 1 tablet (20 mg total) daily. 08/16/21 11/14/21  Mercy Riding, MD  metoprolol succinate (TOPROL-XL) 25 MG 24 hr tablet Take 25 mg by mouth daily. 07/02/21   [provider]  Multiple  Vitamins-Minerals (ICAPS AREDS 2 PO) Take 1 capsule by mouth daily.    [provider]  polyethylene glycol powder (MIRALAX) 17 GM/SCOOP powder Take 17 g by mouth 2 (two) times daily as needed for moderate constipation. 08/14/21   Mercy Riding, MD  potassium chloride (KLOR-CON) 10 MEQ tablet Take 10 mEq by mouth daily as needed. Take 65mq daily as needed when taking a dose of lasix 05/31/21   [provider]  senna-docusate (SENOKOT-S) 8.6-50 MG tablet Take 1 tablet by mouth 2 (two) times daily between meals as  needed for mild constipation. 08/14/21   GMercy Riding MD  valsartan (DIOVAN) 40 MG tablet Take 1 tablet (40 mg total) by mouth daily. 08/16/21 08/16/22  GMercy Riding MD  gabapentin (NEURONTIN) 300 MG capsule Take 300 mg by mouth 3 (three) times daily.    03/12/19  [provider]    Physical Exam: Vitals:   07/03/22 1258 07/03/22 1415 07/03/22 1615 07/03/22 1725  BP: (!) 151/88 (!) 143/89 (!) 153/88   Pulse: 84 80 80   Resp: 20 (!) 23 (!) 22   Temp: 98.9 F (37.2 C)   99.6 F (37.6 C)  TempSrc:    Oral  SpO2: 98% 97% 96%     Physical Exam Constitutional:      General: She is not in acute distress.    Appearance: Normal appearance.  HENT:     Head: Normocephalic and atraumatic.     Mouth/Throat:     Mouth: Mucous membranes are moist.     Pharynx: Oropharynx is clear.  Eyes:     Extraocular Movements: Extraocular movements intact.     Pupils: Pupils are equal, round, and reactive to light.  Cardiovascular:     Rate and Rhythm: Normal rate and regular rhythm.     Pulses: Normal pulses.     Heart sounds: Normal heart sounds.  Pulmonary:     Effort: Pulmonary effort is normal. No respiratory distress.     Breath sounds: Rales present.  Abdominal:     General: Bowel sounds are normal. There is no distension.     Palpations: Abdomen is soft.     Tenderness: There is no abdominal tenderness.  Musculoskeletal:        General: No swelling or deformity.  Skin:    General: Skin is warm and dry.  Neurological:     General: No focal deficit present.     Mental Status: Mental status is at baseline.    Labs on Admission: I have personally reviewed following labs and imaging studies  CBC: Recent Labs  Lab 07/03/22 1251  WBC 3.0*  NEUTROABS 1.8  HGB 9.2*  HCT 27.4*  MCV 82.0  PLT 1413   Basic Metabolic Panel: Recent Labs  Lab 07/03/22 1251  NA 130*  K 3.9  CL 92*  CO2 26  GLUCOSE 97  BUN 15  CREATININE 0.99  CALCIUM 9.0    GFR: CrCl cannot be  calculated (Unknown ideal weight.).  Liver Function Tests: Recent Labs  Lab 07/03/22 1251  AST 23  ALT 12  ALKPHOS 67  BILITOT 0.8  PROT 6.9  ALBUMIN 3.0*    Urine analysis:    Component Value Date/Time   LABSPEC 1.020 03/11/2019 1835   PHURINE 6.5 03/11/2019 1835   GLUCOSEU NEGATIVE 03/11/2019 1[place]07/31/2020 1[place]07/31/2020 1[place]07/31/2020 1[place]07/31/2020 1835  UROBILINOGEN 0.2 03/11/2019 1835   NITRITE NEGATIVE 03/11/2019 [place] 03/11/2019 1835    Radiological Exams on Admission: DG Chest Port 1 View  Result Date: 07/03/2022 CLINICAL DATA:  COVID EXAM: PORTABLE CHEST 1 VIEW COMPARISON:  08/09/2021 FINDINGS: Cardiomegaly. Subtle diffuse interstitial opacity. Severe bilateral glenohumeral arthrosis. IMPRESSION: Cardiomegaly with subtle diffuse interstitial opacity, similar in appearance to prior examination and may reflect edema or chronic interstitial change. No new or focal airspace opacity. Electronically Signed   By: Delanna Ahmadi M.D.   On: 07/03/2022 15:07    EKG: Independently reviewed.  Sinus rhythm at 83 bpm.  Baseline artifact.  Nonspecific T wave changes.  Low voltage multiple leads.  Assessment/Plan Principal Problem:   COVID-19 virus infection Active Problems:   Essential hypertension   History of TIA (transient ischemic attack)   MASTECTOMY, BILATERAL, HX OF   Anemia   Chronic diastolic (congestive) heart failure (HCC)   Generalized weakness   COVID-19 infection Generalized weakness > Patient with 2 days of symptoms including cough, fever diagnosed with COVID-19 outpatient.  Started on Paxlovid last night and has taken 2 doses.  Today unable to walk unaided and patient brought to the ED due to family unable to adequately care for patient at home. > Patient also reports decreased p.o. intake and some mild shortness of breath.  Noted to be tachypneic  in the ED but otherwise stable. > Imaging workup included chest x-ray with diffuse interstitial opacities similar to previous unclear if this represents COVID but no discrete opacities noted.  Also noted to be leukopenic consistent with COVID-19.  Confirmatory COVID-19 test pending. - Monitor on telemetry - Continue with outpatient Paxlovid - PT and OT eval and treat - Daily labs - Gentle IV fluids overnight considering decreased p.o. intake - Supportive care - As needed albuterol  Hypertension - Replace home valsartan with formulary irbesartan - Continue home metoprolol and Lasix  Anemia > Hemoglobin stable at 9.2 - Continue to trend CBC  Chronic diastolic CHF > Last echo earlier this year with EF 60-65%, G1 DD, normal RV function. - Continue with metoprolol and ARB as above - Continue with Lasix given rales on exam in setting of COVID and CHF - Gentle IV fluids overnight as above due to concern for decreased p.o. intake  History of GI bleed - Continue home PPI  DVT prophylaxis: Lovenox Code Status:   Full Family Communication:  Updated at bedside Disposition Plan:   Patient is from:  Home  Anticipated DC to:  Home  Anticipated DC date:  1 to 3 days  Anticipated DC barriers: None  Consults called:  None Admission status:  Observation, telemetry  Severity of Illness: The appropriate patient status for this patient is OBSERVATION. Observation status is judged to be reasonable and necessary in order to provide the required intensity of service to ensure the patient's safety. The patient's presenting symptoms, physical exam findings, and initial radiographic and laboratory data in the context of their medical condition is felt to place them at decreased risk for further clinical deterioration. Furthermore, it is anticipated that the patient will be medically stable for discharge from the hospital within 2 midnights of admission.    Marcelyn Bruins MD [facility]  How to contact the [facility] Attending or Consulting provider [place] or covering provider during after hours [place], for this patient?   Check the care team in [facility] and look for a) attending/consulting [place] provider listed and b)  the [facility] team listed Log into [email or web address] and use [place]'s universal password to access. If you do not have the password, please contact the hospital operator. Locate the [facility] provider you are looking for under [facility] and page to a number that you can be directly reached. If you still have difficulty reaching the provider, please page the [facility] (Director on Call) for the Hospitalists listed on amion for assistance.  07/03/2022, 5:32 PM

## 2022-07-03 NOTE — ED Notes (Signed)
ED TO INPATIENT HANDOFF REPORT  ED Nurse Name and Phone #: Andee Poles [phone]  S Name/Age/Gender Cassandra Wu 88 y.o. female Room/Bed: 034C/034C  Code Status   Code Status: Full Code  Home/SNF/Other Home Patient oriented to: self, place, time, and situation Is this baseline? Yes   Triage Complete: Triage complete  Chief Complaint COVID-19 virus infection [U07.1]  Triage Note Pt arrived POV from home after testing positive for COVID. Per daughter yesterday when pt test positive she had irregular breathing, tachycardia, fever and weakness. Pt's daughter states she understands these are normal COVID symptoms but because of those symptoms she cannot take care of her at home in this state it is beyond her care.    Allergies Allergies  Allergen Reactions   Penicillins    Propranolol Hcl     Level of Care/Admitting Diagnosis ED Disposition     ED Disposition  Admit   Condition  --   Comment  Hospital Area: [place] [[id number]]  Level of Care: Telemetry Medical [104]  May place patient in observation at [facility] or [place] if equivalent level of care is available:: No  Covid Evaluation: Confirmed COVID Positive  Diagnosis: COVID-19 virus infection [[id number]]  Admitting Physician: Marcelyn Bruins [[id number]]  Attending Physician: Marcelyn Bruins [[id number]]          B Medical/Surgery History Past Medical History:  Diagnosis Date   Breast cancer ([place])    Carpal tunnel syndrome    Hypertension    Past Surgical History:  Procedure Laterality Date   BIOPSY  08/13/2021   Procedure: BIOPSY;  Surgeon: Irene Shipper, MD;  Location: [place];  Service: Endoscopy;;   ESOPHAGOGASTRODUODENOSCOPY (EGD) WITH PROPOFOL N/A 08/13/2021   Procedure: ESOPHAGOGASTRODUODENOSCOPY (EGD) WITH PROPOFOL;  Surgeon: Irene Shipper, MD;  Location: [place];  Service: Endoscopy;  Laterality: N/A;   left breast mastectomy (other)     TONSILLECTOMY        A IV Location/Drains/Wounds Patient Lines/Drains/Airways Status     Active Line/Drains/Airways     Name Placement date Placement time Site Days   Peripheral IV 07/03/22 20 G Posterior;Right Forearm 07/03/22  1424  Forearm  less than 1            Intake/Output Last 24 hours No intake or output data in the 24 hours ending 07/03/22 1705  Labs/Imaging Results for orders placed or performed during the hospital encounter of 07/03/22 (from the past 48 hour(s))  Comprehensive metabolic panel     Status: Abnormal   Collection Time: 07/03/22 12:51 PM  Result Value Ref Range   Sodium 130 (L) 135 - 145 mmol/L   Potassium 3.9 3.5 - 5.1 mmol/L   Chloride 92 (L) 98 - 111 mmol/L   CO2 26 22 - 32 mmol/L   Glucose, Bld 97 70 - 99 mg/dL    Comment: Glucose reference range applies only to samples taken after fasting for at least 8 hours.   BUN 15 8 - 23 mg/dL   Creatinine, Ser 0.99 0.44 - 1.00 mg/dL   Calcium 9.0 8.9 - 10.3 mg/dL   Total Protein 6.9 6.5 - 8.1 g/dL   Albumin 3.0 (L) 3.5 - 5.0 g/dL   AST 23 15 - 41 U/L   ALT 12 0 - 44 U/L   Alkaline Phosphatase 67 38 - 126 U/L   Total Bilirubin 0.8 0.3 - 1.2 mg/dL   GFR, Estimated 53 (L) >60 mL/min    Comment: (NOTE) Calculated  using the CKD-EPI Creatinine Equation (2021)    Anion gap 12 5 - 15    Comment: Performed at [facility], [place] [address] [place], [place] [address]  Troponin I (High Sensitivity)     Status: Abnormal   Collection Time: 07/03/22 12:51 PM  Result Value Ref Range   Troponin I (High Sensitivity) 33 (H) <18 ng/L    Comment: (NOTE) Elevated high sensitivity troponin I (hsTnI) values and significant  changes across serial measurements may suggest ACS but many other  chronic and acute conditions are known to elevate hsTnI results.  Refer to the "Links" section for chest pain algorithms and additional  guidance. Performed at [facility], [place] [address], [place], [place] [address]   CBC with  Differential     Status: Abnormal   Collection Time: 07/03/22 12:51 PM  Result Value Ref Range   WBC 3.0 (L) 4.0 - 10.5 K/uL   RBC 3.34 (L) 3.87 - 5.11 MIL/uL   Hemoglobin 9.2 (L) 12.0 - 15.0 g/dL   HCT 27.4 (L) 36.0 - 46.0 %   MCV 82.0 80.0 - 100.0 fL   MCH 27.5 26.0 - 34.0 pg   MCHC 33.6 30.0 - 36.0 g/dL   RDW 13.9 11.5 - 15.5 %   Platelets 193 150 - 400 K/uL   nRBC 0.0 0.0 - 0.2 %   Neutrophils Relative % 61 %   Neutro Abs 1.8 1.7 - 7.7 K/uL   Lymphocytes Relative 24 %   Lymphs Abs 0.7 0.7 - 4.0 K/uL   Monocytes Relative 15 %   Monocytes Absolute 0.5 0.1 - 1.0 K/uL   Eosinophils Relative 0 %   Eosinophils Absolute 0.0 0.0 - 0.5 K/uL   Basophils Relative 0 %   Basophils Absolute 0.0 0.0 - 0.1 K/uL   Immature Granulocytes 0 %   Abs Immature Granulocytes 0.01 0.00 - 0.07 K/uL    Comment: Performed at [facility] [address] [place], [place] [address]  Lactic acid, plasma     Status: None   Collection Time: 07/03/22  2:24 PM  Result Value Ref Range   Lactic Acid, Venous 0.8 0.5 - 1.9 mmol/L    Comment: Performed at [facility] [address] [place], [place] [address]   DG Chest Port 1 View  Result Date: 07/03/2022 CLINICAL DATA:  COVID EXAM: PORTABLE CHEST 1 VIEW COMPARISON:  08/09/2021 FINDINGS: Cardiomegaly. Subtle diffuse interstitial opacity. Severe bilateral glenohumeral arthrosis. IMPRESSION: Cardiomegaly with subtle diffuse interstitial opacity, similar in appearance to prior examination and may reflect edema or chronic interstitial change. No new or focal airspace opacity. Electronically Signed   By: Delanna Ahmadi M.D.   On: 07/03/2022 15:07    Pending Labs Unresulted Labs (From admission, onward)     Start     Ordered   07/10/22 0500  Creatinine, serum  (enoxaparin (LOVENOX)    CrCl >/= 30 ml/min)  Weekly,   R     Comments: while on enoxaparin therapy    07/03/22 1656   07/04/22 0500  Comprehensive metabolic panel  Tomorrow morning,   R         07/03/22 1656   07/04/22 0500  CBC  Tomorrow morning,   R        07/03/22 1656   07/04/22 0500  C-reactive protein  Daily at 5am,   R      07/03/22 1702   07/04/22 0500  Ferritin  Daily at  5am,   R      07/03/22 1702   07/03/22 1412  Urinalysis, Routine w reflex microscopic  Once,   URGENT        07/03/22 1411   07/03/22 1411  Brain natriuretic peptide  Once,   URGENT        07/03/22 1411   07/03/22 1411  Lactic acid, plasma  Now then every 2 hours,   R      07/03/22 1411   07/03/22 1411  Resp Panel by RT-PCR (Flu A&B, Covid) Anterior Nasal Swab  Once,   URGENT        07/03/22 1411            Vitals/Pain Today's Vitals   07/03/22 1258 07/03/22 1304 07/03/22 1415 07/03/22 1615  BP: (!) 151/88  (!) 143/89 (!) 153/88  Pulse: 84  80 80  Resp: 20  (!) 23 (!) 22  Temp: 98.9 F (37.2 C)     SpO2: 98%  97% 96%  PainSc:  0-No pain      Isolation Precautions Airborne and Contact precautions  Medications Medications  lactated ringers infusion ( Intravenous New Bag/Given 07/03/22 1426)  metoprolol succinate (TOPROL-XL) 24 hr tablet 25 mg (has no administration in time range)  irbesartan (AVAPRO) tablet 37.5 mg (has no administration in time range)  pantoprazole (PROTONIX) EC tablet 40 mg (has no administration in time range)  enoxaparin (LOVENOX) injection 40 mg (has no administration in time range)  nirmatrelvir/ritonavir EUA (PAXLOVID) 3 tablet (has no administration in time range)  sodium chloride flush (NS) 0.9 % injection 3 mL (has no administration in time range)  acetaminophen (TYLENOL) tablet 650 mg (has no administration in time range)    Or  acetaminophen (TYLENOL) suppository 650 mg (has no administration in time range)  polyethylene glycol (MIRALAX / GLYCOLAX) packet 17 g (has no administration in time range)    Mobility non-ambulatory Low fall risk   Focused Assessments    R Recommendations: See Admitting Provider Note  Report given to:    Additional Notes:

## 2022-07-03 NOTE — ED Provider Notes (Signed)
[facility] DEPARTMENT Provider Note   CSN: [id number] Arrival date & time: 07/03/22  1251     History  Chief Complaint  Patient presents with   Covid Positive    Cassandra Wu is a 88 y.o. female.  HPI Patient became ill about 2 days ago.  She has had harsh cough and symptoms of fever up to 102 last night.  Today she was too weak to walk independently.  Patient reports she did have a headache although does not have chest pain at this time.  She does feel mildly short of breath.  Patient was started on Paxlovid last night and has had 2 doses.  She has not had vomiting but has had decreased oral intake.  She only drank a few sips of fluids and a few bites of food today.    Home Medications Prior to Admission medications   Medication Sig Start Date End Date Taking? Authorizing Provider  ALPRAZolam (XANAX) 0.25 MG tablet Take 1 tablet (0.25 mg total) by mouth 2 (two) times daily as needed for anxiety. 08/14/21   Mercy Riding, MD  Cholecalciferol (D3 PO) Take 1 capsule by mouth daily.    [provider]  Cyanocobalamin (B-12 PO) Take 1 tablet by mouth daily.    [provider]  Dexlansoprazole 30 MG capsule DR Take 30 mg by mouth daily.    [provider]  diclofenac Sodium (VOLTAREN) 1 % GEL Apply topically 4 (four) times daily.    [provider]  ferrous sulfate 325 (65 FE) MG tablet Take 1 tablet (325 mg total) by mouth 2 (two) times daily with a meal. 08/14/21   Gonfa, Charlesetta Ivory, MD  furosemide (LASIX) 20 MG tablet Take 2 tablets (40 mg total) by mouth daily for 7 days, THEN 1 tablet (20 mg total) daily. 08/16/21 11/14/21  Mercy Riding, MD  metoprolol succinate (TOPROL-XL) 25 MG 24 hr tablet Take 25 mg by mouth daily. 07/02/21   [provider]  Multiple Vitamins-Minerals (ICAPS AREDS 2 PO) Take 1 capsule by mouth daily.    [provider]  polyethylene glycol powder (MIRALAX) 17 GM/SCOOP powder Take  17 g by mouth 2 (two) times daily as needed for moderate constipation. 08/14/21   Mercy Riding, MD  potassium chloride (KLOR-CON) 10 MEQ tablet Take 10 mEq by mouth daily as needed. Take 12mq daily as needed when taking a dose of lasix 05/31/21   [provider]  senna-docusate (SENOKOT-S) 8.6-50 MG tablet Take 1 tablet by mouth 2 (two) times daily between meals as needed for mild constipation. 08/14/21   GMercy Riding MD  valsartan (DIOVAN) 40 MG tablet Take 1 tablet (40 mg total) by mouth daily. 08/16/21 08/16/22  GMercy Riding MD  gabapentin (NEURONTIN) 300 MG capsule Take 300 mg by mouth 3 (three) times daily.    03/12/19  [provider]      Allergies    Penicillins and Propranolol hcl    Review of Systems   Review of Systems  Physical Exam Updated Vital Signs BP (!) 153/88   Pulse 80   Temp 98.9 F (37.2 C)   Resp (!) 22   SpO2 96%  Physical Exam Constitutional:      Comments: Patient is alert.  Frequent cough but no respiratory distress at rest.  HENT:     Mouth/Throat:     Mouth: Mucous membranes are dry.     Pharynx: Oropharynx is clear.  Eyes:     Extraocular Movements: Extraocular movements intact.  Cardiovascular:     Rate and Rhythm: Normal rate and regular rhythm.  Pulmonary:     Comments: Mild increased work of breathing.  Frequent cough.  Occasional crackles at the bases. Abdominal:     General: There is no distension.     Palpations: Abdomen is soft.     Tenderness: There is no abdominal tenderness. There is no guarding.  Musculoskeletal:        General: Normal range of motion.     Comments: 2+ edema bilateral lower extremities.  No Wounds to the feet.  Neurological:     General: No focal deficit present.     Coordination: Coordination normal.  Psychiatric:        Mood and Affect: Mood normal.     ED Results / Procedures / Treatments   Labs (all labs ordered are listed, but only abnormal results are displayed) Labs Reviewed   COMPREHENSIVE METABOLIC PANEL - Abnormal; Notable for the following components:      Result Value   Sodium 130 (*)    Chloride 92 (*)    Albumin 3.0 (*)    GFR, Estimated 53 (*)    All other components within normal limits  CBC WITH DIFFERENTIAL/PLATELET - Abnormal; Notable for the following components:   WBC 3.0 (*)    RBC 3.34 (*)    Hemoglobin 9.2 (*)    HCT 27.4 (*)    All other components within normal limits  TROPONIN I (HIGH SENSITIVITY) - Abnormal; Notable for the following components:   Troponin I (High Sensitivity) 33 (*)    All other components within normal limits  RESP PANEL BY RT-PCR (FLU A&B, COVID) ARPGX2  LACTIC ACID, PLASMA  BRAIN NATRIURETIC PEPTIDE  LACTIC ACID, PLASMA  URINALYSIS, ROUTINE W REFLEX MICROSCOPIC  TROPONIN I (HIGH SENSITIVITY)    EKG EKG Interpretation  Date/Time:  Thursday July 03 2022 13:02:21 EST Ventricular Rate:  83 PR Interval:  116 QRS Duration: 86 QT Interval:  374 QTC Calculation: 439 R Axis:   15 Text Interpretation: Normal sinus rhythm Cannot rule out Anterior infarct , age undetermined Abnormal ECG When compared with ECG of 09-Aug-2021 18:31, PREVIOUS ECG IS PRESENT artifact but no sig change from previous Confirmed by Charlesetta Shanks [phone]) on 07/03/2022 1:53:26 PM  Radiology DG Chest Port 1 View  Result Date: 07/03/2022 CLINICAL DATA:  COVID EXAM: PORTABLE CHEST 1 VIEW COMPARISON:  08/09/2021 FINDINGS: Cardiomegaly. Subtle diffuse interstitial opacity. Severe bilateral glenohumeral arthrosis. IMPRESSION: Cardiomegaly with subtle diffuse interstitial opacity, similar in appearance to prior examination and may reflect edema or chronic interstitial change. No new or focal airspace opacity. Electronically Signed   By: Delanna Ahmadi M.D.   On: 07/03/2022 15:07    Procedures Procedures    Medications Ordered in ED Medications  lactated ringers infusion ( Intravenous New Bag/Given 07/03/22 1426)    ED Course/ Medical  Decision Making/ A&P                           Medical Decision Making Amount and/or Complexity of Data Reviewed Labs: ordered. Radiology: ordered.  Risk Prescription drug management.   Patient presents symptomatic with COVID test positive at home.  She has had decreased oral intake and now significant generalized weakness.  Proceed with general testing chest x-ray and basic lab work.  Patient has hyponatremia, mild.  She has had some prior hyponatremia.  I  suspect some dehydration and poor oral intake.  Will initiate lactic Ringer's at maintenance rate.  Patient also has history of CHF.  She does have mild crackle on exam.  Family reports that her lower extremity swelling is very chronic and better than usual at this time.  Chest x-ray reviewed by radiology is for some fibrotic changes versus mild edema.  At this time I do suspect her severe general weakness likely is related to dehydration more so than CHF.  With patient significant advanced age, poor oral intake and COVID-positive we will plan for admission.        Final Clinical Impression(s) / ED Diagnoses Final diagnoses:  COVID  General weakness  Advanced age    Rx / Talpa Orders ED Discharge Orders     None         Charlesetta Shanks, MD 07/03/22 [phone]

## 2022-07-04 ENCOUNTER — Observation Stay (HOSPITAL_COMMUNITY): Payer: Medicare Other

## 2022-07-04 DIAGNOSIS — N179 Acute kidney failure, unspecified: Secondary | ICD-10-CM | POA: Diagnosis present

## 2022-07-04 DIAGNOSIS — U071 COVID-19: Secondary | ICD-10-CM | POA: Diagnosis present

## 2022-07-04 DIAGNOSIS — Z66 Do not resuscitate: Secondary | ICD-10-CM | POA: Diagnosis present

## 2022-07-04 DIAGNOSIS — R1110 Vomiting, unspecified: Secondary | ICD-10-CM | POA: Diagnosis not present

## 2022-07-04 DIAGNOSIS — I700 Atherosclerosis of aorta: Secondary | ICD-10-CM | POA: Diagnosis not present

## 2022-07-04 DIAGNOSIS — I890 Lymphedema, not elsewhere classified: Secondary | ICD-10-CM | POA: Diagnosis present

## 2022-07-04 DIAGNOSIS — I10 Essential (primary) hypertension: Secondary | ICD-10-CM | POA: Diagnosis not present

## 2022-07-04 DIAGNOSIS — Z853 Personal history of malignant neoplasm of breast: Secondary | ICD-10-CM | POA: Diagnosis not present

## 2022-07-04 DIAGNOSIS — W44F9XA Other object of natural or organic material, entering into or through a natural orifice, initial encounter: Secondary | ICD-10-CM | POA: Diagnosis present

## 2022-07-04 DIAGNOSIS — Z79899 Other long term (current) drug therapy: Secondary | ICD-10-CM | POA: Diagnosis not present

## 2022-07-04 DIAGNOSIS — J9601 Acute respiratory failure with hypoxia: Secondary | ICD-10-CM | POA: Diagnosis present

## 2022-07-04 DIAGNOSIS — R059 Cough, unspecified: Secondary | ICD-10-CM | POA: Diagnosis not present

## 2022-07-04 DIAGNOSIS — R0602 Shortness of breath: Secondary | ICD-10-CM | POA: Diagnosis not present

## 2022-07-04 DIAGNOSIS — R7989 Other specified abnormal findings of blood chemistry: Secondary | ICD-10-CM | POA: Diagnosis not present

## 2022-07-04 DIAGNOSIS — R0902 Hypoxemia: Secondary | ICD-10-CM | POA: Diagnosis not present

## 2022-07-04 DIAGNOSIS — I5032 Chronic diastolic (congestive) heart failure: Secondary | ICD-10-CM | POA: Diagnosis present

## 2022-07-04 DIAGNOSIS — R54 Age-related physical debility: Secondary | ICD-10-CM | POA: Diagnosis present

## 2022-07-04 DIAGNOSIS — T17918A Gastric contents in respiratory tract, part unspecified causing other injury, initial encounter: Secondary | ICD-10-CM | POA: Diagnosis present

## 2022-07-04 DIAGNOSIS — E860 Dehydration: Secondary | ICD-10-CM | POA: Diagnosis present

## 2022-07-04 DIAGNOSIS — R112 Nausea with vomiting, unspecified: Secondary | ICD-10-CM | POA: Diagnosis not present

## 2022-07-04 DIAGNOSIS — Z884 Allergy status to anesthetic agent status: Secondary | ICD-10-CM | POA: Diagnosis not present

## 2022-07-04 DIAGNOSIS — D649 Anemia, unspecified: Secondary | ICD-10-CM | POA: Diagnosis present

## 2022-07-04 DIAGNOSIS — N1330 Unspecified hydronephrosis: Secondary | ICD-10-CM | POA: Diagnosis not present

## 2022-07-04 DIAGNOSIS — J1282 Pneumonia due to coronavirus disease 2019: Secondary | ICD-10-CM | POA: Diagnosis present

## 2022-07-04 DIAGNOSIS — Z880 Allergy status to penicillin: Secondary | ICD-10-CM | POA: Diagnosis not present

## 2022-07-04 DIAGNOSIS — E871 Hypo-osmolality and hyponatremia: Secondary | ICD-10-CM | POA: Diagnosis present

## 2022-07-04 DIAGNOSIS — E876 Hypokalemia: Secondary | ICD-10-CM | POA: Diagnosis not present

## 2022-07-04 DIAGNOSIS — J690 Pneumonitis due to inhalation of food and vomit: Secondary | ICD-10-CM | POA: Diagnosis present

## 2022-07-04 DIAGNOSIS — R531 Weakness: Secondary | ICD-10-CM | POA: Diagnosis present

## 2022-07-04 DIAGNOSIS — Z8673 Personal history of transient ischemic attack (TIA), and cerebral infarction without residual deficits: Secondary | ICD-10-CM | POA: Diagnosis not present

## 2022-07-04 DIAGNOSIS — Z9013 Acquired absence of bilateral breasts and nipples: Secondary | ICD-10-CM | POA: Diagnosis not present

## 2022-07-04 DIAGNOSIS — I110 Hypertensive heart disease with heart failure: Secondary | ICD-10-CM | POA: Diagnosis present

## 2022-07-04 LAB — COMPREHENSIVE METABOLIC PANEL
ALT: 14 U/L (ref 0–44)
AST: 26 U/L (ref 15–41)
Albumin: 3.1 g/dL — ABNORMAL LOW (ref 3.5–5.0)
Alkaline Phosphatase: 68 U/L (ref 38–126)
Anion gap: 14 (ref 5–15)
BUN: 15 mg/dL (ref 8–23)
CO2: 26 mmol/L (ref 22–32)
Calcium: 9.2 mg/dL (ref 8.9–10.3)
Chloride: 92 mmol/L — ABNORMAL LOW (ref 98–111)
Creatinine, Ser: 0.98 mg/dL (ref 0.44–1.00)
GFR, Estimated: 53 mL/min — ABNORMAL LOW (ref >60)
Glucose, Bld: 104 mg/dL — ABNORMAL HIGH (ref 70–99)
Potassium: 3.9 mmol/L (ref 3.5–5.1)
Sodium: 132 mmol/L — ABNORMAL LOW (ref 135–145)
Total Bilirubin: 0.9 mg/dL (ref 0.3–1.2)
Total Protein: 7.3 g/dL (ref 6.5–8.1)

## 2022-07-04 LAB — CBC
HCT: 30.6 % — ABNORMAL LOW (ref 36.0–46.0)
Hemoglobin: 10 g/dL — ABNORMAL LOW (ref 12.0–15.0)
MCH: 26.7 pg (ref 26.0–34.0)
MCHC: 32.7 g/dL (ref 30.0–36.0)
MCV: 81.8 fL (ref 80.0–100.0)
Platelets: 220 10*3/uL (ref 150–400)
RBC: 3.74 MIL/uL — ABNORMAL LOW (ref 3.87–5.11)
RDW: 13.9 % (ref 11.5–15.5)
WBC: 7.6 10*3/uL (ref 4.0–10.5)
nRBC: 0 % (ref 0.0–0.2)

## 2022-07-04 LAB — URINALYSIS, ROUTINE W REFLEX MICROSCOPIC
Bilirubin Urine: NEGATIVE
Glucose, UA: NEGATIVE mg/dL
Ketones, ur: 5 mg/dL — AB
Nitrite: POSITIVE — AB
Protein, ur: 100 mg/dL — AB
Specific Gravity, Urine: 1.015 (ref 1.005–1.030)
WBC, UA: >50 WBC/hpf — ABNORMAL HIGH (ref 0–5)
pH: 5 (ref 5.0–8.0)

## 2022-07-04 LAB — C-REACTIVE PROTEIN: CRP: 10.7 mg/dL — ABNORMAL HIGH (ref <1.0)

## 2022-07-04 LAB — FERRITIN: Ferritin: 324 ng/mL — ABNORMAL HIGH (ref 11–307)

## 2022-07-04 MED ORDER — METRONIDAZOLE 500 MG/100ML IV SOLN
500.0000 mg | Freq: Two times a day (BID) | INTRAVENOUS | Status: AC
  Administered 2022-07-04 – 2022-07-08 (×10): 500 mg via INTRAVENOUS
  Filled 2022-07-04 (×10): qty 100

## 2022-07-04 MED ORDER — SODIUM CHLORIDE 0.9 % IV SOLN
2.0000 g | Freq: Every day | INTRAVENOUS | Status: AC
  Administered 2022-07-04 – 2022-07-08 (×5): 2 g via INTRAVENOUS
  Filled 2022-07-04 (×6): qty 20

## 2022-07-04 NOTE — Evaluation (Signed)
Occupational Therapy Evaluation Patient Details Name: Cassandra Wu MRN: [id number] DOB: 1925/05/06 Today's Date: 07/04/2022   History of Present Illness Cassandra Wu is a 88 y.o. female with medical history significant of hypertension, TIA, anemia, diastolic CHF, lymphedema, history of mastectomy, history of breast cancer, history of GI bleed presenting with COVID and generalized decline.   Clinical Impression   Patient admitted for the diagnosis above.  Able to speak with daughter to confirm PLOF.  Patient lives alone in her home, and has 24 hour PCA coverage.  The patient walks in/out of the home with RW, participates with grooming, but has assist with bathing and dressing.  Daughter endorses she has mild forgetfulness, but is very sharp at baseline.  The patient uses an iPhone and iPad regularly.  Currently the patient is not at her baseline for mentation, and is needing Max A for mobility, and ADL at bedlevel.  OT to continue efforts in the acute setting, and SNF for post acute rehab will be recommended, depending on progress.       Recommendations for follow up therapy are one component of a multi-disciplinary discharge planning process, led by the attending physician.  Recommendations may be updated based on patient status, additional functional criteria and insurance authorization.   Follow Up Recommendations  Skilled nursing-short term rehab (<3 hours/day)     Assistance Recommended at Discharge Frequent or constant Supervision/Assistance  Patient can return home with the following Assistance with cooking/housework;A lot of help with walking and/or transfers;A lot of help with bathing/dressing/bathroom    Functional Status Assessment  Patient has had a recent decline in their functional status and demonstrates the ability to make significant improvements in function in a reasonable and predictable amount of time.  Equipment Recommendations  None recommended by OT     Recommendations for Other Services       Precautions / Restrictions Precautions Precautions: Fall Precaution Comments: MBS 11/24 Restrictions Weight Bearing Restrictions: No      Mobility Bed Mobility Overal bed mobility: Needs Assistance Bed Mobility: Rolling Rolling: Mod assist         General bed mobility comments: repositioned in bed    Transfers                          Balance                                           ADL either performed or assessed with clinical judgement   ADL   Eating/Feeding: NPO Eating/Feeding Details (indicate cue type and reason): MBS 11/24, results pending                       Toilet Transfer Details (indicate cue type and reason): unable                 Vision   Vision Assessment?: No apparent visual deficits     Perception     Praxis      Pertinent Vitals/Pain Pain Assessment Pain Assessment: No/denies pain     Hand Dominance Right   Extremity/Trunk Assessment Upper Extremity Assessment Upper Extremity Assessment: Generalized weakness RUE Deficits / Details: Per daughter hx of RCT, but able to reach top of her head LUE Deficits / Details: Per daughter hx of RCT, but able to reach top of her head  Lower Extremity Assessment Lower Extremity Assessment: Defer to PT evaluation RLE Deficits / Details: AAROM WFL, strength hip flexion 2/5, knee extension 4-/5, ankle DF 4-/5 RLE Coordination: decreased gross motor LLE Deficits / Details: AAROM WFL, strength hip flexion 2/5, knee extension 4-/5, ankle DF 4-/5 LLE Coordination: decreased gross motor   Cervical / Trunk Assessment Cervical / Trunk Assessment: Kyphotic   Communication Communication Communication: HOH   Cognition Arousal/Alertness: Lethargic Behavior During Therapy: Flat affect Overall Cognitive Status: Impaired/Different from baseline                     Current Attention Level:  Sustained Memory: Decreased short-term memory   Safety/Judgement: Decreased awareness of deficits, Decreased awareness of safety   Problem Solving: Slow processing, Difficulty sequencing       General Comments   VSS    Exercises     Shoulder Instructions      Home Living Family/patient expects to be discharged to:: Private residence Living Arrangements: Alone Available Help at Discharge: Personal care attendant;Available 24 hours/day;Family;Available PRN/intermittently Type of Home: House Home Access: Ramped entrance     Home Layout: One level;Laundry or work area in basement     [employer] Shower/Tub: Teacher, early years/pre: Standard Bathroom Accessibility: Yes How Accessible: Accessible via walker Home Equipment: Conservation officer, nature (2 wheels);Rollator (4 wheels);Tub bench;Hand held shower head;Grab bars - tub/shower          Prior Functioning/Environment Prior Level of Function : Needs assist       Physical Assist : Mobility (physical);ADLs (physical)   ADLs (physical): Bathing;Dressing;IADLs Mobility Comments: pt confused thinking she is at home and reports lives alone though notes from last admission ADLs Comments: Aide assists with ADL/showers and iADL.  Patient able to participate with grooming, self feeding.  Able to use the microwave at RW level.  Uses iPhone and iPad.        OT Problem List: Decreased strength;Decreased activity tolerance;Impaired balance (sitting and/or standing);Decreased cognition      OT Treatment/Interventions: Self-care/ADL training;Therapeutic activities;Patient/family education;DME and/or AE instruction;Balance training;Therapeutic exercise    OT Goals(Current goals can be found in the care plan section) Acute Rehab OT Goals Patient Stated Goal: None stated OT Goal Formulation: With patient Time For Goal Achievement: 07/18/22 Potential to Achieve Goals: Fair ADL Goals Pt Will Perform Grooming: with  set-up;sitting;standing Pt Will Transfer to Toilet: with supervision;ambulating;regular height toilet Pt/caregiver will Perform Home Exercise Program: Increased strength;Both right and left upper extremity;With Supervision  OT Frequency: Min 2X/week    Co-evaluation              AM-PAC OT "6 Clicks" Daily Activity     Outcome Measure Help from another person eating meals?: Total Help from another person taking care of personal grooming?: A Lot Help from another person toileting, which includes using toliet, bedpan, or urinal?: Total Help from another person bathing (including washing, rinsing, drying)?: Total Help from another person to put on and taking off regular upper body clothing?: A Lot Help from another person to put on and taking off regular lower body clothing?: Total 6 Click Score: 8   End of Session Nurse Communication: Mobility status  Activity Tolerance: Patient limited by lethargy Patient left: in bed;with call bell/phone within reach;with nursing/sitter in room;with family/visitor present  OT Visit Diagnosis: Unsteadiness on feet (R26.81);Muscle weakness (generalized) (M62.81);Other symptoms and signs involving cognitive function                Time:  7342-8768 OT Time Calculation (min): 18 min Charges:  OT General Charges $OT Visit: 1 Visit OT Evaluation $OT Eval Moderate Complexity: 1 Mod  07/04/2022  RP, OTR/L  Acute Rehabilitation Services  Office:  [phone]   Metta Clines 07/04/2022, 12:58 PM

## 2022-07-04 NOTE — Progress Notes (Signed)
Patient had vomiting episode while coughing. Desat to 82% on room air and tachypnea and tachycardia noted. Maintain on high back rest, oral suction done, prn Zofran  '4mg'$  iv given,seen by RT on duty, suction done and vomited  large amount of gastric content again. Dr. Aileen Fass notified with orders in place.  Care ongoing  07/04/22 0300  Assess: MEWS Score  Temp 100.2 F (37.9 C)  BP (!) 173/101  MAP (mmHg) 119  Pulse Rate (!) 120  ECG Heart Rate (!) 122  Resp (!) 23  Assess: MEWS Score  MEWS Temp 0  MEWS Systolic 0  MEWS Pulse 2  MEWS RR 1  MEWS LOC 0  MEWS Score 3  MEWS Score Color Yellow  Assess: if the MEWS score is Yellow or Red  Were vital signs taken at a resting state? No  Focused Assessment Change from prior assessment (see assessment flowsheet)  Does the patient meet 2 or more of the SIRS criteria? No  MEWS guidelines implemented *See Row Information* Yes  Treat  MEWS Interventions Administered prn meds/treatments  Pain Scale 0-10  Pain Score 0  Take Vital Signs  Increase Vital Sign Frequency  Yellow: Q 2hr X 2 then Q 4hr X 2, if remains yellow, continue Q 4hrs  Escalate  MEWS: Escalate Yellow: discuss with charge nurse/RN and consider discussing with provider and RRT  Notify: Charge Nurse/RN  Name of Charge Nurse/RN Notified samuel downey  Date Charge Nurse/RN Notified 07/04/22  Time Charge Nurse/RN Notified 0330  Provider Notification  Provider Name/Title Dr. Aileen Fass  Date Provider Notified 07/04/22  Time Provider Notified 0400  Method of Notification Call (secured chat initially)  Notification Reason Change in status  Provider response See new orders;En route  Date of Provider Response 07/04/22  Time of Provider Response 0405  Document  Patient Outcome Stabilized after interventions  Progress note created (see row info) Yes  Assess: SIRS CRITERIA  SIRS Temperature  0  SIRS Pulse 1  SIRS Respirations  1  SIRS WBC 1  SIRS Score Sum  3

## 2022-07-04 NOTE — Progress Notes (Addendum)
PROGRESS NOTE        PATIENT DETAILS Name: Cassandra Wu Age: 88 y.o. Sex: female Date of Birth: Jan 10, 1925 Admit Date: 07/03/2022 Admitting Physician Marcelyn Bruins, MD PCP:Pcp, No  Brief Summary: Patient is a 88 y.o.  female with history of HTN, chronic HFpEF, history of breast cancer (s/p mastectomy in 1998)-who developed COVID-19 infection a few days prior to this hospitalization-presented to the ED with weakness-she was subsequently admitted to the hospitalist service.  Post discharge-patient had a episode of coughing spells following which she vomited and aspirated.  See below for further details.  Significant events: 11/22>>covid +ve at home, started on Paxlovid 11/23>> to ED with generalized weakness/COVID infection-admit to [facility] 11/24>> vomiting after coughing spell-food particles aspirated from airway-suspicion for aspiration pneumonia-started on Unasyn.  Significant studies: 01/02>> echo: EF 93-81%, grade 1 diastolic dysfunction. 11/23>> CXR: No PNA 11/24>> CXR: Chronic interstitial coarsening but no obvious infiltrates.  Significant microbiology data: 11/23>> COVID PCR: Positive 11/23>> influenza PCR: Negative  Procedures: None  Consults: None  Subjective: Appears incredibly frail-soft voice-sitting up in bed-on 2 L of oxygen.  Continues to cough  Objective: Vitals: Blood pressure (!) 155/88, pulse (!) 120, temperature 99.9 F (37.7 C), temperature source Oral, resp. rate (!) 22, height '5\' 1"'$  (1.549 m), weight 66 kg, SpO2 95 %.   Exam: Gen Exam: Not in any distress-very frail appearing. HEENT:atraumatic, normocephalic Chest: Air well-some transmitted upper airway sounds. CVS:S1S2 regular Abdomen:soft non tender, non distended Extremities:+ edema Neurology: Non focal-but with severe generalized weakness. Skin: no rash  Pertinent Labs/Radiology:    Latest Ref Rng & Units 07/04/2022    2:16 AM 07/03/2022   12:51 PM  10/02/2021    3:34 PM  CBC  WBC 4.0 - 10.5 K/uL 7.6  3.0  2.9   Hemoglobin 12.0 - 15.0 g/dL 10.0  9.2  10.1   Hematocrit 36.0 - 46.0 % 30.6  27.4  31.9   Platelets 150 - 400 K/uL 220  193  244     Lab Results  Component Value Date   NA 132 (L) 07/04/2022   K 3.9 07/04/2022   CL 92 (L) 07/04/2022   CO2 26 07/04/2022      Assessment/Plan: Acute hypoxic respiratory failure likely due to aspiration pneumonia Aspiration event overnight-suspect has PNA even though chest x-ray does not show infiltrates. Continue Unasyn Frail/very debilitated but stable on 2-3 L of oxygen. SLP evaluation in progress. Titrate down oxygen as tolerated.  COVID-19 infection No obvious infiltrates on CXR on initial presentation CRP elevated but this could be from an aspiration event overnight. Will watch closely-if hypoxia worsens-start empiric steroids Continue Paxlovid (started prior to this admission-had 2 doses at home)  Chronic HFpEF Resume Lasix Keep in negative balance Volume status relatively stable  HTN BP stable on valsartan/metoprolol Per pharmacy-no longer on valsartan-will stop-monitor on metoprolol.  Normocytic anemia Chronic issue Follows with Dr. Lorenso Courier as an outpatient Follow CBC periodically  Remote history of breast cancer s/p mastectomy 1998-followed by 5 years of tamoxifen  Severe debility/deconditioning Due to COVID-19 infection/acute illness Very frail/deconditioned Given severity of frailty-will be at risk for aspiration going forward  Palliative care Discussed with patient earlier-she understands her tenuous clinical situation. Subsequently discussed with patient's daughter-Ahmedta Via (is a RN)-agreeable with DNR status-goals of care for general medical treatment, if she were to deteriorate-apart from  hospice care no other options. Per patient's daughter-patient is being followed by palliative care as an outpatient.  BMI: Estimated body mass index is 27.49 kg/m  as calculated from the following:   Height as of this encounter: '5\' 1"'$  (1.549 m).   Weight as of this encounter: 66 kg.   Code status:   Code Status: Full Code   DVT Prophylaxis: enoxaparin (LOVENOX) injection 40 mg Start: 07/03/22 1700    Family Communication:  [place] over the phone.  Addendum Got a call from RN to update another daughter-Zaneta Johnnye Sima [phone] her as well. From tomorrow-we will try to have a family update over the phone x 1  Disposition Plan: Status is: Observation The patient will require care spanning > 2 midnights and should be moved to inpatient because: COVID-severe debility/weakness-aspiration event overnight requiring oxygen.   Planned Discharge Destination:Skilled nursing facility   Diet: Diet Order             DIET DYS 2 Room service appropriate? No; Fluid consistency: Thin  Diet effective now                     Antimicrobial agents: Anti-infectives (From admission, onward)    Start     Dose/Rate Route Frequency Ordered Stop   07/04/22 0600  cefTRIAXone (ROCEPHIN) 2 g in sodium chloride 0.9 % 100 mL IVPB        2 g 200 mL/hr over 30 Minutes Intravenous Daily 07/04/22 0428     07/04/22 0600  metroNIDAZOLE (FLAGYL) IVPB 500 mg        500 mg 100 mL/hr over 60 Minutes Intravenous 2 times daily 07/04/22 0428     07/03/22 2200  nirmatrelvir/ritonavir EUA (PAXLOVID) 3 tablet  Status:  Discontinued        3 tablet Oral 2 times daily 07/03/22 1656 07/03/22 2016   07/03/22 2200  nirmatrelvir/ritonavir EUA (renal dosing) (PAXLOVID) 2 tablet        2 tablet Oral 2 times daily 07/03/22 2016 07/08/22 2159        MEDICATIONS: Scheduled Meds:  enoxaparin (LOVENOX) injection  40 mg Subcutaneous Q24H   furosemide  40 mg Oral Daily   irbesartan  37.5 mg Oral Daily   metoprolol succinate  25 mg Oral Daily   nirmatrelvir/ritonavir EUA (renal dosing)  2 tablet Oral BID   pantoprazole  40 mg Oral  Daily   sodium chloride flush  3 mL Intravenous Q12H   Continuous Infusions:  cefTRIAXone (ROCEPHIN)  IV 2 g (07/04/22 0511)   metronidazole 500 mg (07/04/22 0542)   PRN Meds:.acetaminophen **OR** acetaminophen, ondansetron (ZOFRAN) IV, polyethylene glycol   I have personally reviewed following labs and imaging studies  LABORATORY DATA: CBC: Recent Labs  Lab 07/03/22 1251 07/04/22 0216  WBC 3.0* 7.6  NEUTROABS 1.8  --   HGB 9.2* 10.0*  HCT 27.4* 30.6*  MCV 82.0 81.8  PLT 193 932    Basic Metabolic Panel: Recent Labs  Lab 07/03/22 1251 07/04/22 0216  NA 130* 132*  K 3.9 3.9  CL 92* 92*  CO2 26 26  GLUCOSE 97 104*  BUN 15 15  CREATININE 0.99 0.98  CALCIUM 9.0 9.2    GFR: Estimated Creatinine Clearance: 29.9 mL/min (by C-G formula based on SCr of 0.98 mg/dL).  Liver Function Tests: Recent Labs  Lab 07/03/22 1251 07/04/22 0216  AST 23 26  ALT 12 14  ALKPHOS 67 68  BILITOT 0.8 0.9  PROT 6.9 7.3  ALBUMIN 3.0* 3.1*   No results for input(s): "LIPASE", "AMYLASE" in the last 168 hours. No results for input(s): "AMMONIA" in the last 168 hours.  Coagulation Profile: No results for input(s): "INR", "PROTIME" in the last 168 hours.  Cardiac Enzymes: No results for input(s): "CKTOTAL", "CKMB", "CKMBINDEX", "TROPONINI" in the last 168 hours.  BNP (last 3 results) No results for input(s): "PROBNP" in the last 8760 hours.  Lipid Profile: No results for input(s): "CHOL", "HDL", "LDLCALC", "TRIG", "CHOLHDL", "LDLDIRECT" in the last 72 hours.  Thyroid Function Tests: No results for input(s): "TSH", "T4TOTAL", "FREET4", "T3FREE", "THYROIDAB" in the last 72 hours.  Anemia Panel: Recent Labs    07/04/22 0216  FERRITIN 324*    Urine analysis:    Component Value Date/Time   COLORURINE AMBER (A) 07/04/2022 0415   APPEARANCEUR CLOUDY (A) 07/04/2022 0415   LABSPEC 1.015 07/04/2022 0415   PHURINE 5.0 07/04/2022 0415   GLUCOSEU NEGATIVE 07/04/2022 0415    HGBUR SMALL (A) 07/04/2022 0415   BILIRUBINUR NEGATIVE 07/04/2022 0415   KETONESUR 5 (A) 07/04/2022 0415   PROTEINUR 100 (A) 07/04/2022 0415   UROBILINOGEN 0.2 03/11/2019 1835   NITRITE POSITIVE (A) 07/04/2022 0415   LEUKOCYTESUR LARGE (A) 07/04/2022 0415    Sepsis Labs: Lactic Acid, Venous    Component Value Date/Time   LATICACIDVEN 0.9 07/03/2022 1635    MICROBIOLOGY: Recent Results (from the past 240 hour(s))  Resp Panel by RT-PCR (Flu A&B, Covid) Anterior Nasal Swab     Status: Abnormal   Collection Time: 07/03/22  2:11 PM   Specimen: Anterior Nasal Swab  Result Value Ref Range Status   SARS Coronavirus 2 by RT PCR POSITIVE (A) NEGATIVE Final    Comment: (NOTE) SARS-CoV-2 target nucleic acids are DETECTED.  The SARS-CoV-2 RNA is generally detectable in upper respiratory specimens during the acute phase of infection. Positive results are indicative of the presence of the identified virus, but do not rule out bacterial infection or co-infection with other pathogens not detected by the test. Clinical correlation with patient history and other diagnostic information is necessary to determine patient infection status. The expected result is Negative.  Fact Sheet for Patients: EntrepreneurPulse.com.au  Fact Sheet for Healthcare Providers: IncredibleEmployment.be  This test is not yet approved or cleared by the [place] FDA and  has been authorized for detection and/or diagnosis of SARS-CoV-2 by FDA under an Emergency Use Authorization (EUA).  This EUA will remain in effect (meaning this test can be used) for the duration of  the COVID-19 declaration under Section 564(b)(1) of the A ct, 21 U.S.C. section 360bbb-3(b)(1), unless the authorization is terminated or revoked sooner.     Influenza A by PCR NEGATIVE NEGATIVE Final   Influenza B by PCR NEGATIVE NEGATIVE Final    Comment: (NOTE) The Xpert Xpress SARS-CoV-2/FLU/RSV plus  assay is intended as an aid in the diagnosis of influenza from Nasopharyngeal swab specimens and should not be used as a sole basis for treatment. Nasal washings and aspirates are unacceptable for Xpert Xpress SARS-CoV-2/FLU/RSV testing.  Fact Sheet for Patients: EntrepreneurPulse.com.au  Fact Sheet for Healthcare Providers: IncredibleEmployment.be  This test is not yet approved or cleared by the [place] FDA and has been authorized for detection and/or diagnosis of SARS-CoV-2 by FDA under an Emergency Use Authorization (EUA). This EUA will remain in effect (meaning this test can be used) for the duration of the COVID-19 declaration under Section 564(b)(1) of the Act, 21 U.S.C. section 360bbb-3(b)(1), unless the authorization is  terminated or revoked.  Performed at [facility], [place] [address], [place], [place] [address]     RADIOLOGY STUDIES/RESULTS: DG CHEST PORT 1 VIEW  Result Date: 07/04/2022 CLINICAL DATA:  Hypoxia.  Cough, vomiting and aspiration. EXAM: PORTABLE CHEST 1 VIEW COMPARISON:  07/03/2022 FINDINGS: Stable cardiac enlargement. Aortic tortuosity with atherosclerotic calcifications. No pleural effusion or edema. Coarsened interstitial markings are noted bilaterally, unchanged. No airspace opacities. Severe arthropathic changes are noted involving both glenohumeral joints. IMPRESSION: 1. No acute cardiopulmonary abnormalities. 2. Chronic interstitial coarsening. Electronically Signed   By: Kerby Moors M.D.   On: 07/04/2022 05:08   DG Chest Port 1 View  Result Date: 07/03/2022 CLINICAL DATA:  COVID EXAM: PORTABLE CHEST 1 VIEW COMPARISON:  08/09/2021 FINDINGS: Cardiomegaly. Subtle diffuse interstitial opacity. Severe bilateral glenohumeral arthrosis. IMPRESSION: Cardiomegaly with subtle diffuse interstitial opacity, similar in appearance to prior examination and may reflect edema or chronic interstitial change. No new or  focal airspace opacity. Electronically Signed   By: Delanna Ahmadi M.D.   On: 07/03/2022 15:07     LOS: 0 days   Oren Binet, MD  [facility]    To contact the attending provider between 7A-7P or the covering provider during after hours 7P-7A, please log into the web site [email or web address] and access using universal [place] password for that web site. If you do not have the password, please call the hospital operator.  07/04/2022, 10:12 AM

## 2022-07-04 NOTE — Evaluation (Signed)
Clinical/Bedside Swallow Evaluation Patient Details  Name: Cassandra Wu MRN: [id number] Date of Birth: July 23, 1925  Today's Date: 07/04/2022 Time: SLP Start Time (ACUTE ONLY): 7062 SLP Stop Time (ACUTE ONLY): 0844 SLP Time Calculation (min) (ACUTE ONLY): 16 min  Past Medical History:  Past Medical History:  Diagnosis Date   Breast cancer ([place])    Carpal tunnel syndrome    Hypertension    Past Surgical History:  Past Surgical History:  Procedure Laterality Date   BIOPSY  08/13/2021   Procedure: BIOPSY;  Surgeon: Irene Shipper, MD;  Location: [facility] ENDOSCOPY;  Service: Endoscopy;;   ESOPHAGOGASTRODUODENOSCOPY (EGD) WITH PROPOFOL N/A 08/13/2021   Procedure: ESOPHAGOGASTRODUODENOSCOPY (EGD) WITH PROPOFOL;  Surgeon: Irene Shipper, MD;  Location[facility] ENDOSCOPY;  Service: Endoscopy;  Laterality: N/A;   left breast mastectomy (other)     TONSILLECTOMY     HPI:  Cassandra Wu is a 88 y.o. female with medical history significant of hypertension, TIA, anemia, diastolic CHF, lymphedema, history of mastectomy, breast cancer, GI bleed presenting with COVID and generalized decline. Admitted with cough, fever and unble to walk found to have COVID. Cough, followed by emesis, RT suctioned yellow thin liquid from airway. Pt made NPO with ST order.    Assessment / Plan / Recommendation  Clinical Impression  Pt alert and coversisve noted to have wet vocal quality intermittently at baseline. She demonstrates facial symmetry and adequate strength. Dentures placed however ill fitting, without cream in room therefore, removed. Intermittent oral holding/hesitation in transit with thin and puree. Accepted straw sips thin with one delayed cough from multiple trials . Suspect she may have an esophageal component as there was frequent eructation.There is mild concern for pharyngeal phase of swallow and therapist MBS (scheduled 2:00 today) to further assess pharyngeal phase. initiate Dys 2, thin liquids,  dentures donned, full assist, crush meds and remain upright after po's. SLP Visit Diagnosis: Dysphagia, unspecified (R13.10)    Aspiration Risk  Mild aspiration risk    Diet Recommendation Dysphagia 2 (Fine chop);Thin liquid   Liquid Administration via: Straw Medication Administration: Crushed with puree Supervision: Staff to assist with self feeding;Full supervision/cueing for compensatory strategies Compensations: Slow rate;Small sips/bites Postural Changes: Seated upright at 90 degrees;Remain upright for at least 30 minutes after po intake    Other  Recommendations Oral Care Recommendations: Oral care BID    Recommendations for follow up therapy are one component of a multi-disciplinary discharge planning process, led by the attending physician.  Recommendations may be updated based on patient status, additional functional criteria and insurance authorization.  Follow up Recommendations  (TBD)      Assistance Recommended at Discharge    Functional Status Assessment Patient has had a recent decline in their functional status and demonstrates the ability to make significant improvements in function in a reasonable and predictable amount of time.  Frequency and Duration min 2x/week  2 weeks       Prognosis Prognosis for Safe Diet Advancement: Good      Swallow Study   General Date of Onset: 07/04/22 HPI: Cassandra Wu is a 88 y.o. female with medical history significant of hypertension, TIA, anemia, diastolic CHF, lymphedema, history of mastectomy, breast cancer, GI bleed presenting with COVID and generalized decline. Admitted with cough, fever and unble to walk found to have COVID. Cough, followed by emesis, RT suctioned yellow thin liquid from airway. Pt made NPO with ST order. Type of Study: Bedside Swallow Evaluation Previous Swallow Assessment:  (none) Diet Prior  to this Study: NPO Temperature Spikes Noted: No Respiratory Status: Room air History of Recent  Intubation: No Behavior/Cognition: Alert;Cooperative;Pleasant mood Oral Cavity Assessment: Within Functional Limits Oral Care Completed by SLP: No Oral Cavity - Dentition: Dentures, bottom;Dentures, top (needs denture cream) Vision: Functional for self-feeding Self-Feeding Abilities: Needs assist Patient Positioning: Upright in bed Baseline Vocal Quality: Wet Volitional Cough: Weak Volitional Swallow: Able to elicit    Oral/Motor/Sensory Function Overall Oral Motor/Sensory Function: Within functional limits   Ice Chips Ice chips: Not tested   Thin Liquid Thin Liquid: Impaired Presentation: Straw Oral Phase Functional Implications: Oral holding Pharyngeal  Phase Impairments: Cough - Delayed    Nectar Thick Nectar Thick Liquid: Not tested   Honey Thick Honey Thick Liquid: Not tested   Puree Puree: Impaired Oral Phase Functional Implications: Oral holding   Solid     Solid: Not tested (dentures ill fitting)      Houston Siren 07/04/2022,10:08 AM

## 2022-07-04 NOTE — Evaluation (Signed)
Physical Therapy Evaluation Patient Details Name: Cassandra Wu MRN: [id number] DOB: 1925-01-08 Today's Date: 07/04/2022  History of Present Illness  Cassandra Wu is a 88 y.o. female with medical history significant of hypertension, TIA, anemia, diastolic CHF, lymphedema, history of mastectomy, history of breast cancer, history of GI bleed presenting with COVID and generalized decline.  Clinical Impression  Patient presents with decreased mobility due to decreased balance, decreased strength, decreased activity tolerance and decreased cognition.  Unsure if she lived alone as she states she did.  Noted last admission (in Feb) was living alone with aide 6 days a week.  She was not aware of location, time or situation.  She needed max A for EOB mobility.  She will benefit from skilled PT in the acute setting and will likely need STSNF level rehab at d/c.        Recommendations for follow up therapy are one component of a multi-disciplinary discharge planning process, led by the attending physician.  Recommendations may be updated based on patient status, additional functional criteria and insurance authorization.  Follow Up Recommendations Skilled nursing-short term rehab (<3 hours/day) Can patient physically be transported by private vehicle: No    Assistance Recommended at Discharge Frequent or constant Supervision/Assistance  Patient can return home with the following  Two people to help with walking and/or transfers;Two people to help with bathing/dressing/bathroom;Assist for transportation;Help with stairs or ramp for entrance;Assistance with cooking/housework    Equipment Recommendations None recommended by PT  Recommendations for Other Services       Functional Status Assessment Patient has had a recent decline in their functional status and demonstrates the ability to make significant improvements in function in a reasonable and predictable amount of time.      Precautions / Restrictions Precautions Precautions: Fall      Mobility  Bed Mobility Overal bed mobility: Needs Assistance Bed Mobility: Supine to Sit, Sit to Supine     Supine to sit: HOB elevated, Max assist Sit to supine: Mod assist   General bed mobility comments: assist for moving legs slowly to EOB and to lift trunk and scoot hips, to supine assist for legs onto bed and positioning hips    Transfers Overall transfer level: Needs assistance Equipment used: Rolling walker (2 wheels) Transfers: Sit to/from Stand Sit to Stand: Max assist, From elevated surface           General transfer comment: heavy max A to stand and pt with posterior bias unable to extend trunk and pull hips forward so returned to seated EOB    Ambulation/Gait               General Gait Details: unable  Stairs            Wheelchair Mobility    Modified Rankin (Stroke Patients Only)       Balance Overall balance assessment: Needs assistance Sitting-balance support: Feet supported Sitting balance-Leahy Scale: Poor Sitting balance - Comments: min A for balance on EOB falling L and posterior without support   Standing balance support: Bilateral upper extremity supported Standing balance-Leahy Scale: Zero Standing balance comment: Heavy assist with standing attempt                             Pertinent Vitals/Pain Pain Assessment Pain Assessment: No/denies pain    Home Living Family/patient expects to be discharged to:: Private residence Living Arrangements: Children Available Help at Discharge: Personal care attendant (  per chart had PCA 10 months ago for am hours Mon-Sat) Type of Home: House Home Access: Ramped entrance       Home Layout: One level;Laundry or work area in [place]: Conservation officer, nature (2 wheels);Rollator (4 wheels);Tub bench      Prior Function               Mobility Comments: pt confused thinking she is at home and  reports lives alone though notes from last admission       Hand Dominance        Extremity/Trunk Assessment   Upper Extremity Assessment Upper Extremity Assessment: LUE deficits/detail;RUE deficits/detail RUE Deficits / Details: AAROM WFL, strength weaker grip compared to L though pt reports she is R handed, needed cues and assist to reach for walker with R hand to stand LUE Deficits / Details: noted lymphedema throughout, but lifts and accepts some resistance at elbow    Lower Extremity Assessment Lower Extremity Assessment: LLE deficits/detail;RLE deficits/detail RLE Deficits / Details: AAROM WFL, strength hip flexion 2/5, knee extension 4-/5, ankle DF 4-/5 RLE Coordination: decreased gross motor LLE Deficits / Details: AAROM WFL, strength hip flexion 2/5, knee extension 4-/5, ankle DF 4-/5 LLE Coordination: decreased gross motor    Cervical / Trunk Assessment Cervical / Trunk Assessment: Kyphotic  Communication   Communication: HOH  Cognition Arousal/Alertness: Awake/alert Behavior During Therapy: Flat affect Overall Cognitive Status: Impaired/Different from baseline Area of Impairment: Orientation, Memory, Safety/judgement, Attention, Problem solving                 Orientation Level: Disoriented to, Place, Time, Situation Current Attention Level: Sustained Memory: Decreased short-term memory   Safety/Judgement: Decreased awareness of deficits, Decreased awareness of safety   Problem Solving: Slow processing, Decreased initiation, Difficulty sequencing, Requires verbal cues          General Comments      Exercises     Assessment/Plan    PT Assessment Patient needs continued PT services  PT Problem List Decreased strength;Decreased balance;Decreased cognition;Decreased mobility;Decreased activity tolerance;Decreased coordination;Decreased safety awareness       PT Treatment Interventions DME instruction;Functional mobility training;Balance  training;Patient/family education;Therapeutic activities;Therapeutic exercise;Gait training;Cognitive remediation    PT Goals (Current goals can be found in the Care Plan section)  Acute Rehab PT Goals Patient Stated Goal: unable to state PT Goal Formulation: Patient unable to participate in goal setting Time For Goal Achievement: 07/18/22 Potential to Achieve Goals: Fair    Frequency Min 2X/week     Co-evaluation               AM-PAC PT "6 Clicks" Mobility  Outcome Measure Help needed turning from your back to your side while in a flat bed without using bedrails?: A Lot Help needed moving from lying on your back to sitting on the side of a flat bed without using bedrails?: Total Help needed moving to and from a bed to a chair (including a wheelchair)?: Total Help needed standing up from a chair using your arms (e.g., wheelchair or bedside chair)?: Total Help needed to walk in hospital room?: Total Help needed climbing 3-5 steps with a railing? : Total 6 Click Score: 7    End of Session Equipment Utilized During Treatment: Gait belt Activity Tolerance: Patient limited by fatigue Patient left: in bed;with call bell/phone within reach Nurse Communication: Mobility status PT Visit Diagnosis: Other abnormalities of gait and mobility (R26.89);Muscle weakness (generalized) (M62.81);Other symptoms and signs involving the nervous system (R29.898)  Time: 5953-9672 PT Time Calculation (min) (ACUTE ONLY): 30 min   Charges:   PT Evaluation $PT Eval High Complexity: 1 High PT Treatments $Therapeutic Activity: 8-22 mins        Magda Kiel, PT Acute Rehabilitation Services Office:[phone] 07/04/2022   Reginia Naas 07/04/2022, 12:17 PM

## 2022-07-04 NOTE — Progress Notes (Signed)
Speech Language Pathology Treatment: Dysphagia  Patient Details Name: Cassandra Wu MRN: [id number] DOB: 19-Dec-1924 Today's Date: 07/04/2022 Time: 3953-2023 SLP Time Calculation (min) (ACUTE ONLY): 15 min  Assessment / Plan / Recommendation Clinical Impression  Received message from radiology that family does not feel pt could participate in MBS at this time. SLP went to room and spoke with pt's daughter, Cassandra Wu and Dr. Sloan Leiter also arrived. Daughter reports a significant change in her mom's cognition since last night and prefers she have MBS when she is more responsive and "acting like her normal self." Options discussed re: po's (downgrade, defer MBS or no MBS with safest diet) and it was decided to downgrade to a more conservative texture of Dys 1 (puree)/nectar and daughter would like to have MBS done in a day or so. Educated that it would be likely Monday before it could be done. Therapist modified texture to Dys 1, nectar thick and will check in with her prior to scheduling MBS to see if pt appropriate and family is in agreement.    HPI HPI: Cassandra Wu is a 88 y.o. female with medical history significant of hypertension, TIA, anemia, diastolic CHF, lymphedema, history of mastectomy, breast cancer, GI bleed presenting with COVID and generalized decline. Admitted with cough, fever and unble to walk found to have COVID. Cough, followed by emesis, RT suctioned yellow thin liquid from airway. Pt made NPO with ST order.      SLP Plan  Continue with current plan of care      Recommendations for follow up therapy are one component of a multi-disciplinary discharge planning process, led by the attending physician.  Recommendations may be updated based on patient status, additional functional criteria and insurance authorization.    Recommendations  Diet recommendations: Dysphagia 1 (puree);Nectar-thick liquid Liquids provided via: Cup;Straw Medication Administration: Crushed  with puree Supervision: Staff to assist with self feeding;Full supervision/cueing for compensatory strategies Compensations: Slow rate;Small sips/bites Postural Changes and/or Swallow Maneuvers: Seated upright 90 degrees                Oral Care Recommendations: Oral care BID Follow Up Recommendations:  (TBD) Assistance recommended at discharge: Frequent or constant Supervision/Assistance SLP Visit Diagnosis: Dysphagia, unspecified (R13.10) Plan: Continue with current plan of care           Cassandra Wu  07/04/2022, 2:49 PM

## 2022-07-04 NOTE — Progress Notes (Signed)
RT called to bedside by RN after patient had vomited with possible aspiration, RT suctioned patient and a large amount of thin yellow liquid was removed from patients airway. MD made aware, RT will continue to monitor patient.

## 2022-07-04 NOTE — Progress Notes (Addendum)
Received a call from bedside RN regarding the patient coughing, vomiting, and possibly aspirating.  RT was called and the patient was suctioned, food particles were extracted.  Unasyn started.  Chest x-ray ordered.    The patient was made n.p.o. until she passes a swallow evaluation by speech therapist.

## 2022-07-05 ENCOUNTER — Inpatient Hospital Stay (HOSPITAL_COMMUNITY): Payer: Medicare Other

## 2022-07-05 DIAGNOSIS — Z9013 Acquired absence of bilateral breasts and nipples: Secondary | ICD-10-CM | POA: Diagnosis not present

## 2022-07-05 DIAGNOSIS — J690 Pneumonitis due to inhalation of food and vomit: Secondary | ICD-10-CM | POA: Diagnosis not present

## 2022-07-05 DIAGNOSIS — J9601 Acute respiratory failure with hypoxia: Secondary | ICD-10-CM | POA: Diagnosis not present

## 2022-07-05 DIAGNOSIS — U071 COVID-19: Secondary | ICD-10-CM | POA: Diagnosis not present

## 2022-07-05 LAB — COMPREHENSIVE METABOLIC PANEL
ALT: 14 U/L (ref 0–44)
AST: 25 U/L (ref 15–41)
Albumin: 2.7 g/dL — ABNORMAL LOW (ref 3.5–5.0)
Alkaline Phosphatase: 65 U/L (ref 38–126)
Anion gap: 16 — ABNORMAL HIGH (ref 5–15)
BUN: 26 mg/dL — ABNORMAL HIGH (ref 8–23)
CO2: 26 mmol/L (ref 22–32)
Calcium: 9.5 mg/dL (ref 8.9–10.3)
Chloride: 92 mmol/L — ABNORMAL LOW (ref 98–111)
Creatinine, Ser: 1.47 mg/dL — ABNORMAL HIGH (ref 0.44–1.00)
GFR, Estimated: 33 mL/min — ABNORMAL LOW (ref >60)
Glucose, Bld: 126 mg/dL — ABNORMAL HIGH (ref 70–99)
Potassium: 3.5 mmol/L (ref 3.5–5.1)
Sodium: 134 mmol/L — ABNORMAL LOW (ref 135–145)
Total Bilirubin: 0.5 mg/dL (ref 0.3–1.2)
Total Protein: 6.9 g/dL (ref 6.5–8.1)

## 2022-07-05 LAB — C-REACTIVE PROTEIN: CRP: 19.2 mg/dL — ABNORMAL HIGH (ref <1.0)

## 2022-07-05 LAB — CBC
HCT: 30.7 % — ABNORMAL LOW (ref 36.0–46.0)
Hemoglobin: 10.2 g/dL — ABNORMAL LOW (ref 12.0–15.0)
MCH: 27.1 pg (ref 26.0–34.0)
MCHC: 33.2 g/dL (ref 30.0–36.0)
MCV: 81.6 fL (ref 80.0–100.0)
Platelets: 215 10*3/uL (ref 150–400)
RBC: 3.76 MIL/uL — ABNORMAL LOW (ref 3.87–5.11)
RDW: 14.2 % (ref 11.5–15.5)
WBC: 7.9 10*3/uL (ref 4.0–10.5)
nRBC: 0 % (ref 0.0–0.2)

## 2022-07-05 LAB — D-DIMER, QUANTITATIVE: D-Dimer, Quant: 2.94 ug/mL-FEU — ABNORMAL HIGH (ref 0.00–0.50)

## 2022-07-05 MED ORDER — POTASSIUM CHLORIDE CRYS ER 20 MEQ PO TBCR
20.0000 meq | EXTENDED_RELEASE_TABLET | Freq: Once | ORAL | Status: AC
  Administered 2022-07-05: 20 meq via ORAL
  Filled 2022-07-05: qty 1

## 2022-07-05 MED ORDER — SODIUM CHLORIDE 0.9 % IV SOLN: INTRAVENOUS | Status: DC

## 2022-07-05 MED ORDER — ONDANSETRON HCL 4 MG/2ML IJ SOLN
4.0000 mg | Freq: Four times a day (QID) | INTRAMUSCULAR | Status: DC | PRN
  Administered 2022-07-05 – 2022-07-10 (×2): 4 mg via INTRAVENOUS
  Filled 2022-07-05 (×2): qty 2

## 2022-07-05 MED ORDER — ORAL CARE MOUTH RINSE: 15.0000 mL | OROMUCOSAL | Status: DC | PRN

## 2022-07-05 MED ORDER — FUROSEMIDE 40 MG PO TABS
40.0000 mg | ORAL_TABLET | Freq: Every day | ORAL | Status: DC
  Administered 2022-07-07 – 2022-07-13 (×7): 40 mg via ORAL
  Filled 2022-07-05 (×7): qty 1

## 2022-07-05 MED ORDER — ORAL CARE MOUTH RINSE
15.0000 mL | OROMUCOSAL | Status: DC
  Administered 2022-07-06 – 2022-07-15 (×34): 15 mL via OROMUCOSAL

## 2022-07-05 MED ORDER — PROCHLORPERAZINE EDISYLATE 10 MG/2ML IJ SOLN
5.0000 mg | INTRAMUSCULAR | Status: AC
  Administered 2022-07-05: 5 mg via INTRAVENOUS
  Filled 2022-07-05: qty 2

## 2022-07-05 NOTE — Progress Notes (Addendum)
PROGRESS NOTE        PATIENT DETAILS Name: Cassandra Wu Age: 88 y.o. Sex: female Date of Birth: 04-14-1925 Admit Date: 07/03/2022 Admitting Physician Marcelyn Bruins, MD MHD:[id number], Magdalene River, FNP  Brief Summary: Patient is a 88 y.o.  female with history of HTN, chronic HFpEF, history of breast cancer (s/p mastectomy in 1998)-who developed COVID-19 infection a few days prior to this hospitalization-presented to the ED with weakness-she was subsequently admitted to the hospitalist service.  Post discharge-patient had a episode of coughing spells following which she vomited and aspirated.  See below for further details.  Significant events: 11/22>>covid +ve at home, started on Paxlovid 11/23>> to ED with generalized weakness/COVID infection-admit to [facility] 11/24>> vomiting after coughing spell-food particles aspirated from airway-suspicion for aspiration pneumonia-started on Unasyn.  Significant studies: 01/02>> echo: EF 19-41%, grade 1 diastolic dysfunction. 11/23>> CXR: No PNA 11/24>> CXR: Chronic interstitial coarsening but no obvious infiltrates. 11/25>> CXR: No obvious PNA-some chronic interstitial changes.  Significant microbiology data: 11/23>> COVID PCR: Positive 11/23>> influenza PCR: Negative  Procedures: None  Consults: None  Subjective: Looks a whole lot better today!.  Awake/alert-more fluent in her conversations.  Was using the incentive spirometry when I walked in.  On just 2 L of oxygen which I removed-O2 saturation remained in the mid 90s  Objective: Vitals: Blood pressure (!) 147/80, pulse 85, temperature 98.2 F (36.8 C), temperature source Oral, resp. rate 18, height '5\' 1"'$  (1.549 m), weight 68.8 kg, SpO2 92 %.   Exam: Gen Exam:Alert awake-not in any distress HEENT:atraumatic, normocephalic Chest: B/L clear to auscultation anteriorly CVS:S1S2 regular Abdomen:soft non tender, non distended Extremities:+  edema Neurology: Non focal-but with generalized weakness. Skin: no rash  Pertinent Labs/Radiology:    Latest Ref Rng & Units 07/05/2022    6:04 AM 07/04/2022    2:16 AM 07/03/2022   12:51 PM  CBC  WBC 4.0 - 10.5 K/uL 7.9  7.6  3.0   Hemoglobin 12.0 - 15.0 g/dL 10.2  10.0  9.2   Hematocrit 36.0 - 46.0 % 30.7  30.6  27.4   Platelets 150 - 400 K/uL 215  220  193     Lab Results  Component Value Date   NA 134 (L) 07/05/2022   K 3.5 07/05/2022   CL 92 (L) 07/05/2022   CO2 26 07/05/2022      Assessment/Plan: Acute hypoxic respiratory failure likely due to aspiration pneumonia Improved  Titrated off oxygen this morning-stable on room air  Continue Rocephin/Flagyl  SLP following  Plans for possible MBS early next week   COVID-19 infection CXR without any obvious infiltrates  Hypoxia which was likely due to aspiration-better-she is on room air this morning.   CRP continues to uptrend  Given clinical improvement-lack of obvious infiltrates-hold off on starting steroids  Continue Paxlovid   Minimally elevated D-dimer Due to COVID-19 infection Low suspicion for VTE given improving hypoxemia Check Dopplers lower extremity On prophylactic Lovenox  AKI Mild Likely hemodynamically mediated Encourage oral intake Discussed with patient/family-if oral intake remains poor today-May need gentle hydration with IVF Hold furosemide for now  Chronic HFpEF Volume status stable Has some mild chronic leg edema-at baseline-appears unchanged Hold Lasix due to mild AKI  HTN BP stable-continue metoprolol   Normocytic anemia Chronic issue Follows with Dr. Lorenso Courier as an outpatient Follow CBC periodically  Remote  history of breast cancer s/p mastectomy 1998-followed by 5 years of tamoxifen  Severe debility/deconditioning Due to COVID-19 infection/acute illness Much more awake/alert and less frail today compared to yesterday  Continue PT/OT/SLP eval  Mobilize as much as possible  very frail/deconditioned Given severity of frailty-will be at risk for aspiration going forward  Palliative care See prior notes  DNR status  If worsens-May need to start hospice discussion with family  However-Seems to have improved significantly overnight Per patient's daughter-patient is being followed by palliative care as an outpatient.  BMI: Estimated body mass index is 28.66 kg/m as calculated from the following:   Height as of this encounter: '5\' 1"'$  (1.549 m).   Weight as of this encounter: 68.8 kg.   Code status:   Code Status: DNR   DVT Prophylaxis: enoxaparin (LOVENOX) injection 40 mg Start: 07/03/22 1700    Family Communication:  Daughter Zabeta at bedside on 11/25 Daughter-Ahmedta [phone]-updated over the phone on 11/24.  Disposition Plan: Status is: Observation The patient will require care spanning > 2 midnights and should be moved to inpatient because: COVID-severe debility/weakness-aspiration event overnight requiring oxygen.   Planned Discharge Destination:Skilled nursing facility   Diet: Diet Order             DIET - DYS 1 Room service appropriate? No; Fluid consistency: Nectar Thick  Diet effective now                     Antimicrobial agents: Anti-infectives (From admission, onward)    Start     Dose/Rate Route Frequency Ordered Stop   07/04/22 0600  cefTRIAXone (ROCEPHIN) 2 g in sodium chloride 0.9 % 100 mL IVPB        2 g 200 mL/hr over 30 Minutes Intravenous Daily 07/04/22 0428     07/04/22 0600  metroNIDAZOLE (FLAGYL) IVPB 500 mg        500 mg 100 mL/hr over 60 Minutes Intravenous 2 times daily 07/04/22 0428     07/03/22 2200  nirmatrelvir/ritonavir EUA (PAXLOVID) 3 tablet  Status:  Discontinued        3 tablet Oral 2 times daily 07/03/22 1656 07/03/22 2016   07/03/22 2200  nirmatrelvir/ritonavir EUA (renal dosing) (PAXLOVID) 2 tablet        2 tablet Oral 2 times daily 07/03/22 2016 07/06/22 2359         MEDICATIONS: Scheduled Meds:  enoxaparin (LOVENOX) injection  40 mg Subcutaneous Q24H   furosemide  40 mg Oral Daily   metoprolol succinate  25 mg Oral Daily   nirmatrelvir/ritonavir EUA (renal dosing)  2 tablet Oral BID   pantoprazole  40 mg Oral Daily   sodium chloride flush  3 mL Intravenous Q12H   Continuous Infusions:  cefTRIAXone (ROCEPHIN)  IV 2 g (07/05/22 0557)   metronidazole 500 mg (07/05/22 0905)   PRN Meds:.acetaminophen **OR** acetaminophen, polyethylene glycol   I have personally reviewed following labs and imaging studies  LABORATORY DATA: CBC: Recent Labs  Lab 07/03/22 1251 07/04/22 0216 07/05/22 0604  WBC 3.0* 7.6 7.9  NEUTROABS 1.8  --   --   HGB 9.2* 10.0* 10.2*  HCT 27.4* 30.6* 30.7*  MCV 82.0 81.8 81.6  PLT 193 220 215     Basic Metabolic Panel: Recent Labs  Lab 07/03/22 1251 07/04/22 0216 07/05/22 0239  NA 130* 132* 134*  K 3.9 3.9 3.5  CL 92* 92* 92*  CO2 '26 26 26  '$ GLUCOSE 97 104* 126*  BUN 15  15 26*  CREATININE 0.99 0.98 1.47*  CALCIUM 9.0 9.2 9.5     GFR: Estimated Creatinine Clearance: 20.3 mL/min (A) (by C-G formula based on SCr of 1.47 mg/dL (H)).  Liver Function Tests: Recent Labs  Lab 07/03/22 1251 07/04/22 0216 07/05/22 0239  AST '23 26 25  '$ ALT '12 14 14  '$ ALKPHOS 67 68 65  BILITOT 0.8 0.9 0.5  PROT 6.9 7.3 6.9  ALBUMIN 3.0* 3.1* 2.7*    No results for input(s): "LIPASE", "AMYLASE" in the last 168 hours. No results for input(s): "AMMONIA" in the last 168 hours.  Coagulation Profile: No results for input(s): "INR", "PROTIME" in the last 168 hours.  Cardiac Enzymes: No results for input(s): "CKTOTAL", "CKMB", "CKMBINDEX", "TROPONINI" in the last 168 hours.  BNP (last 3 results) No results for input(s): "PROBNP" in the last 8760 hours.  Lipid Profile: No results for input(s): "CHOL", "HDL", "LDLCALC", "TRIG", "CHOLHDL", "LDLDIRECT" in the last 72 hours.  Thyroid Function Tests: No results for  input(s): "TSH", "T4TOTAL", "FREET4", "T3FREE", "THYROIDAB" in the last 72 hours.  Anemia Panel: Recent Labs    07/04/22 0216  FERRITIN 324*     Urine analysis:    Component Value Date/Time   COLORURINE AMBER (A) 07/04/2022 0415   APPEARANCEUR CLOUDY (A) 07/04/2022 0415   LABSPEC 1.015 07/04/2022 0415   PHURINE 5.0 07/04/2022 0415   GLUCOSEU NEGATIVE 07/04/2022 0415   HGBUR SMALL (A) 07/04/2022 0415   BILIRUBINUR NEGATIVE 07/04/2022 0415   KETONESUR 5 (A) 07/04/2022 0415   PROTEINUR 100 (A) 07/04/2022 0415   UROBILINOGEN 0.2 03/11/2019 1835   NITRITE POSITIVE (A) 07/04/2022 0415   LEUKOCYTESUR LARGE (A) 07/04/2022 0415    Sepsis Labs: Lactic Acid, Venous    Component Value Date/Time   LATICACIDVEN 0.9 07/03/2022 1635    MICROBIOLOGY: Recent Results (from the past 240 hour(s))  Resp Panel by RT-PCR (Flu A&B, Covid) Anterior Nasal Swab     Status: Abnormal   Collection Time: 07/03/22  2:11 PM   Specimen: Anterior Nasal Swab  Result Value Ref Range Status   SARS Coronavirus 2 by RT PCR POSITIVE (A) NEGATIVE Final    Comment: (NOTE) SARS-CoV-2 target nucleic acids are DETECTED.  The SARS-CoV-2 RNA is generally detectable in upper respiratory specimens during the acute phase of infection. Positive results are indicative of the presence of the identified virus, but do not rule out bacterial infection or co-infection with other pathogens not detected by the test. Clinical correlation with patient history and other diagnostic information is necessary to determine patient infection status. The expected result is Negative.  Fact Sheet for Patients: EntrepreneurPulse.com.au  Fact Sheet for Healthcare Providers: IncredibleEmployment.be  This test is not yet approved or cleared by the [place] FDA and  has been authorized for detection and/or diagnosis of SARS-CoV-2 by FDA under an Emergency Use Authorization (EUA).  This EUA  will remain in effect (meaning this test can be used) for the duration of  the COVID-19 declaration under Section 564(b)(1) of the A ct, 21 U.S.C. section 360bbb-3(b)(1), unless the authorization is terminated or revoked sooner.     Influenza A by PCR NEGATIVE NEGATIVE Final   Influenza B by PCR NEGATIVE NEGATIVE Final    Comment: (NOTE) The Xpert Xpress SARS-CoV-2/FLU/RSV plus assay is intended as an aid in the diagnosis of influenza from Nasopharyngeal swab specimens and should not be used as a sole basis for treatment. Nasal washings and aspirates are unacceptable for Xpert Xpress SARS-CoV-2/FLU/RSV testing.  Fact  Sheet for Patients: EntrepreneurPulse.com.au  Fact Sheet for Healthcare Providers: IncredibleEmployment.be  This test is not yet approved or cleared by the [place] FDA and has been authorized for detection and/or diagnosis of SARS-CoV-2 by FDA under an Emergency Use Authorization (EUA). This EUA will remain in effect (meaning this test can be used) for the duration of the COVID-19 declaration under Section 564(b)(1) of the Act, 21 U.S.C. section 360bbb-3(b)(1), unless the authorization is terminated or revoked.  Performed at [facility], [place] [address], [place], [place] [address]     RADIOLOGY STUDIES/RESULTS: DG Chest Port 1 View  Result Date: 07/05/2022 CLINICAL DATA:  Shortness of breath. EXAM: PORTABLE CHEST 1 VIEW COMPARISON:  July 04, 2022. FINDINGS: Stable cardiomediastinal silhouette. Lungs are clear. Severe degenerative changes seen involving both glenohumeral joints. IMPRESSION: No active disease. Electronically Signed   By: Marijo Conception M.D.   On: 07/05/2022 08:48   DG CHEST PORT 1 VIEW  Result Date: 07/04/2022 CLINICAL DATA:  Hypoxia.  Cough, vomiting and aspiration. EXAM: PORTABLE CHEST 1 VIEW COMPARISON:  07/03/2022 FINDINGS: Stable cardiac enlargement. Aortic tortuosity with atherosclerotic  calcifications. No pleural effusion or edema. Coarsened interstitial markings are noted bilaterally, unchanged. No airspace opacities. Severe arthropathic changes are noted involving both glenohumeral joints. IMPRESSION: 1. No acute cardiopulmonary abnormalities. 2. Chronic interstitial coarsening. Electronically Signed   By: Kerby Moors M.D.   On: 07/04/2022 05:08   DG Chest Port 1 View  Result Date: 07/03/2022 CLINICAL DATA:  COVID EXAM: PORTABLE CHEST 1 VIEW COMPARISON:  08/09/2021 FINDINGS: Cardiomegaly. Subtle diffuse interstitial opacity. Severe bilateral glenohumeral arthrosis. IMPRESSION: Cardiomegaly with subtle diffuse interstitial opacity, similar in appearance to prior examination and may reflect edema or chronic interstitial change. No new or focal airspace opacity. Electronically Signed   By: Delanna Ahmadi M.D.   On: 07/03/2022 15:07     LOS: 1 day   Oren Binet, MD  [facility]    To contact the attending provider between 7A-7P or the covering provider during after hours 7P-7A, please log into the web site [email or web address] and access using universal [place] password for that web site. If you do not have the password, please call the hospital operator.  07/05/2022, 9:55 AM

## 2022-07-05 NOTE — Plan of Care (Signed)
  Problem: Education: Goal: Knowledge of risk factors and measures for prevention of condition will improve Outcome: Progressing   Problem: Respiratory: Goal: Will maintain a patent airway Outcome: Progressing   

## 2022-07-06 ENCOUNTER — Inpatient Hospital Stay (HOSPITAL_COMMUNITY): Payer: Medicare Other

## 2022-07-06 ENCOUNTER — Other Ambulatory Visit (HOSPITAL_COMMUNITY): Payer: Medicare Other

## 2022-07-06 DIAGNOSIS — U071 COVID-19: Secondary | ICD-10-CM | POA: Diagnosis not present

## 2022-07-06 DIAGNOSIS — J690 Pneumonitis due to inhalation of food and vomit: Secondary | ICD-10-CM | POA: Diagnosis not present

## 2022-07-06 DIAGNOSIS — J9601 Acute respiratory failure with hypoxia: Secondary | ICD-10-CM | POA: Diagnosis not present

## 2022-07-06 DIAGNOSIS — Z9013 Acquired absence of bilateral breasts and nipples: Secondary | ICD-10-CM | POA: Diagnosis not present

## 2022-07-06 LAB — BASIC METABOLIC PANEL
Anion gap: 15 (ref 5–15)
BUN: 26 mg/dL — ABNORMAL HIGH (ref 8–23)
CO2: 23 mmol/L (ref 22–32)
Calcium: 8.7 mg/dL — ABNORMAL LOW (ref 8.9–10.3)
Chloride: 96 mmol/L — ABNORMAL LOW (ref 98–111)
Creatinine, Ser: 1.12 mg/dL — ABNORMAL HIGH (ref 0.44–1.00)
GFR, Estimated: 45 mL/min — ABNORMAL LOW (ref >60)
Glucose, Bld: 104 mg/dL — ABNORMAL HIGH (ref 70–99)
Potassium: 4 mmol/L (ref 3.5–5.1)
Sodium: 134 mmol/L — ABNORMAL LOW (ref 135–145)

## 2022-07-06 LAB — C-REACTIVE PROTEIN: CRP: 15.9 mg/dL — ABNORMAL HIGH (ref <1.0)

## 2022-07-06 LAB — MAGNESIUM: Magnesium: 1.6 mg/dL — ABNORMAL LOW (ref 1.7–2.4)

## 2022-07-06 MED ORDER — SODIUM CHLORIDE 0.9 % IV SOLN: INTRAVENOUS | Status: AC

## 2022-07-06 MED ORDER — MAGNESIUM SULFATE 2 GM/50ML IV SOLN
2.0000 g | Freq: Once | INTRAVENOUS | Status: AC
  Administered 2022-07-06: 2 g via INTRAVENOUS
  Filled 2022-07-06: qty 50

## 2022-07-06 MED ORDER — SODIUM CHLORIDE 0.9 % IV SOLN
6.2500 mg | INTRAVENOUS | Status: AC
  Administered 2022-07-06: 6.25 mg via INTRAVENOUS
  Filled 2022-07-06: qty 0.25

## 2022-07-06 NOTE — Plan of Care (Signed)

## 2022-07-06 NOTE — Progress Notes (Signed)
Received a call from bedside RN regarding the patient having intractable nausea and vomiting.  Presented to the patient's room number.  Radiology in the room taking x-ray of her abdomen.  Per her daughter standing outside the room, her mother had a normal bowel movement the morning of her presentation.  Has not complained of abdominal pain.  Personally reviewed x-rays of chest and abdomen.  Chest x-ray was nonacute.  Abdominal x-ray with findings suggestive of ileus versus small bowel obstruction.  Will repeat abdominal x-ray in the morning.  After a dose of IV Phenergan, the patient started to feel better.  No recurrent nausea or vomiting.  The patient is resting at the time of this dictation.  We will continue gentle IV fluid hydration and replace electrolytes as indicated.

## 2022-07-06 NOTE — Progress Notes (Addendum)
Pt vomiting small amounts green emesis. Pt and daughter report pt has been nauseas since admit but vomiting more today. Says she ate small amt of breakfast but no lunch/dinner. Reports BM today and says still passing gas. Denies abdominal pain at rest but right side abdomen tender on palpation. Surgical scar noted and per daughter was from surgery for small bowel obstruction in 2016.   No relief in nausea/emesis after zofran. MD notified and IV compazine given. Still no relief in nausea. Discussed above with night MD, Dr. [place] Crane and order received for xray and phenergan.  IV flushed before phenergan infusion and IV leaking. IV removed and  new IV placed by IV team. Phenergan given via new IV and nausea improved, no more emesis and pt falling asleep around 0200. Daughter at bedside until pt went to sleep around 0200 then left.   Pt awake around 0215 and states is "feeling better" and nausea improved. No more emesis since phenergan. MD notified of xray results and per MD will repeat xray in am.   Update at 0500: Pt denies N/V or abd pain but still abdomen still tender on palpation. States has been sleeping off and on.

## 2022-07-06 NOTE — Progress Notes (Addendum)
PROGRESS NOTE        PATIENT DETAILS Name: Cassandra Wu Age: 88 y.o. Sex: female Date of Birth: 1924-09-09 Admit Date: 07/03/2022 Admitting Physician Marcelyn Bruins, MD IOM:[id number], Magdalene River, FNP  Brief Summary: Patient is a 88 y.o.  female with history of HTN, chronic HFpEF, history of breast cancer (s/p mastectomy in 1998)-who developed COVID-19 infection a few days prior to this hospitalization-presented to the ED with weakness-she was subsequently admitted to the hospitalist service.  Post discharge-patient had a episode of coughing spells following which she vomited and aspirated.  See below for further details.  Significant events: 11/22>>covid +ve at home, started on Paxlovid 11/23>> to ED with generalized weakness/COVID infection-admit to [facility] 11/24>> vomiting after coughing spell-food particles aspirated from airway-suspicion for aspiration pneumonia-started on Unasyn. 11/25>> several vomiting episodes overnight-abdominal x-ray with possible ileus.  Significant studies: 01/02>> echo: EF 53-64%, grade 1 diastolic dysfunction. 11/23>> CXR: No PNA 11/24>> CXR: Chronic interstitial coarsening but no obvious infiltrates. 11/25>> CXR: No obvious PNA-some chronic interstitial changes.  Significant microbiology data: 11/23>> COVID PCR: Positive 11/23>> influenza PCR: Negative  Procedures: None  Consults: None  Subjective: Numerous episodes of vomiting overnight-but has not vomited since around 2 AM. Abdominal pain Passing flatus Last BM 11/25 Remains on oxygen.  Objective: Vitals: Blood pressure (!) 141/73, pulse 90, temperature 97.7 F (36.5 C), temperature source Axillary, resp. rate 19, height '5\' 1"'$  (1.549 m), weight 69.7 kg, SpO2 95 %.   Exam: Gen Exam:Alert awake-not in any distress HEENT:atraumatic, normocephalic Chest: B/L clear to auscultation anteriorly CVS:S1S2 regular Abdomen:soft non tender, non  distended Extremities:trace edema Neurology: Non focal Skin: no rash  Pertinent Labs/Radiology:    Latest Ref Rng & Units 07/05/2022    6:04 AM 07/04/2022    2:16 AM 07/03/2022   12:51 PM  CBC  WBC 4.0 - 10.5 K/uL 7.9  7.6  3.0   Hemoglobin 12.0 - 15.0 g/dL 10.2  10.0  9.2   Hematocrit 36.0 - 46.0 % 30.7  30.6  27.4   Platelets 150 - 400 K/uL 215  220  193     Lab Results  Component Value Date   NA 134 (L) 07/06/2022   K 4.0 07/06/2022   CL 96 (L) 07/06/2022   CO2 23 07/06/2022      Assessment/Plan: Acute hypoxic respiratory failure likely due to aspiration pneumonia Hypoxia resolved-on room air Continue Rocephin/Flagyl SLP following Seems to be tolerating dysphagia 1 diet with nectar thick liquids without any issues Since vomited overnight-keeping n.p.o. for now until CT abdomen can be done to see if she has SBO/ileus.  COVID-19 infection CXR without any obvious infiltrates  Now on room air CRP now downtrending Continue Paxlovid Hold off on steroids as no hypoxia/no infiltrates on x-ray  Vomiting-ileus versus SBO Numerous episodes of vomiting on 11/25 evening/overnight Keep n.p.o. for now-obtain CT abdomen  Minimally elevated D-dimer Due to COVID-19 infection Low suspicion for VTE given improving hypoxemia Check Dopplers lower extremity On prophylactic Lovenox  AKI Mild Likely hemodynamically mediated Creatinine improving with supportive care. Should be okay to resume furosemide.  Chronic HFpEF Volume status stable Has some puffy legs/mild edema at baseline Now the creatinine is better-okay to resume furosemide.  HTN BP stable-continue metoprolol   Normocytic anemia Chronic issue Follows with Dr. Lorenso Courier as an outpatient Follow CBC periodically  Remote history  of breast cancer s/p mastectomy 1998-followed by 5 years of tamoxifen  Severe debility/deconditioning Due to COVID-19 infection/acute illness Much more awake/alert and less frail today  compared to yesterday  Continue PT/OT/SLP eval  Mobilize as much as possible very frail/deconditioned Given severity of frailty-will be at risk for aspiration going forward  Palliative care See prior notes  DNR status  If worsens-May need to start hospice discussion with family  However-Seems to have improved significantly overnight Per patient's daughter-patient is being followed by palliative care as an outpatient.  BMI: Estimated body mass index is 29.03 kg/m as calculated from the following:   Height as of this encounter: '5\' 1"'$  (1.549 m).   Weight as of this encounter: 69.7 kg.   Code status:   Code Status: DNR   DVT Prophylaxis: enoxaparin (LOVENOX) injection 40 mg Start: 07/03/22 1700    Family Communication:  Daughter Si Raider [phone] voicemail on 11/26. Subsequently d/w daughter Doristine Bosworth later in the morning at bedside  Daughter-Ahmedta Via-[phone]-updated over the phone on 11/24   Disposition Plan: Status is: Observation The patient will require care spanning > 2 midnights and should be moved to inpatient because: COVID-severe debility/weakness-aspiration event overnight requiring oxygen.   Planned Discharge Destination:Skilled nursing facility   Diet: Diet Order             Diet NPO time specified Except for: Ice Chips, Sips with Meds  Diet effective now                     Antimicrobial agents: Anti-infectives (From admission, onward)    Start     Dose/Rate Route Frequency Ordered Stop   07/04/22 0600  cefTRIAXone (ROCEPHIN) 2 g in sodium chloride 0.9 % 100 mL IVPB        2 g 200 mL/hr over 30 Minutes Intravenous Daily 07/04/22 0428     07/04/22 0600  metroNIDAZOLE (FLAGYL) IVPB 500 mg        500 mg 100 mL/hr over 60 Minutes Intravenous 2 times daily 07/04/22 0428     07/03/22 2200  nirmatrelvir/ritonavir EUA (PAXLOVID) 3 tablet  Status:  Discontinued        3 tablet Oral 2 times daily 07/03/22 1656 07/03/22 2016    07/03/22 2200  nirmatrelvir/ritonavir EUA (renal dosing) (PAXLOVID) 2 tablet        2 tablet Oral 2 times daily 07/03/22 2016 07/06/22 2359        MEDICATIONS: Scheduled Meds:  enoxaparin (LOVENOX) injection  40 mg Subcutaneous Q24H   [START ON 07/07/2022] furosemide  40 mg Oral Daily   metoprolol succinate  25 mg Oral Daily   nirmatrelvir/ritonavir EUA (renal dosing)  2 tablet Oral BID   mouth rinse  15 mL Mouth Rinse 4 times per day   pantoprazole  40 mg Oral Daily   sodium chloride flush  3 mL Intravenous Q12H   Continuous Infusions:  sodium chloride 50 mL/hr at 07/06/22 0504   cefTRIAXone (ROCEPHIN)  IV 2 g (07/06/22 0521)   metronidazole 500 mg (07/06/22 0825)   PRN Meds:.acetaminophen **OR** acetaminophen, ondansetron (ZOFRAN) IV, mouth rinse, polyethylene glycol   I have personally reviewed following labs and imaging studies  LABORATORY DATA: CBC: Recent Labs  Lab 07/03/22 1251 07/04/22 0216 07/05/22 0604  WBC 3.0* 7.6 7.9  NEUTROABS 1.8  --   --   HGB 9.2* 10.0* 10.2*  HCT 27.4* 30.6* 30.7*  MCV 82.0 81.8 81.6  PLT 193 220 215  Basic Metabolic Panel: Recent Labs  Lab 07/03/22 1251 07/04/22 0216 07/05/22 0239 07/06/22 0237  NA 130* 132* 134* 134*  K 3.9 3.9 3.5 4.0  CL 92* 92* 92* 96*  CO2 '26 26 26 23  '$ GLUCOSE 97 104* 126* 104*  BUN 15 15 26* 26*  CREATININE 0.99 0.98 1.47* 1.12*  CALCIUM 9.0 9.2 9.5 8.7*  MG  --   --   --  1.6*     GFR: Estimated Creatinine Clearance: 26.8 mL/min (A) (by C-G formula based on SCr of 1.12 mg/dL (H)).  Liver Function Tests: Recent Labs  Lab 07/03/22 1251 07/04/22 0216 07/05/22 0239  AST '23 26 25  '$ ALT '12 14 14  '$ ALKPHOS 67 68 65  BILITOT 0.8 0.9 0.5  PROT 6.9 7.3 6.9  ALBUMIN 3.0* 3.1* 2.7*    No results for input(s): "LIPASE", "AMYLASE" in the last 168 hours. No results for input(s): "AMMONIA" in the last 168 hours.  Coagulation Profile: No results for input(s): "INR", "PROTIME" in the last 168  hours.  Cardiac Enzymes: No results for input(s): "CKTOTAL", "CKMB", "CKMBINDEX", "TROPONINI" in the last 168 hours.  BNP (last 3 results) No results for input(s): "PROBNP" in the last 8760 hours.  Lipid Profile: No results for input(s): "CHOL", "HDL", "LDLCALC", "TRIG", "CHOLHDL", "LDLDIRECT" in the last 72 hours.  Thyroid Function Tests: No results for input(s): "TSH", "T4TOTAL", "FREET4", "T3FREE", "THYROIDAB" in the last 72 hours.  Anemia Panel: Recent Labs    07/04/22 0216  FERRITIN 324*     Urine analysis:    Component Value Date/Time   COLORURINE AMBER (A) 07/04/2022 0415   APPEARANCEUR CLOUDY (A) 07/04/2022 0415   LABSPEC 1.015 07/04/2022 0415   PHURINE 5.0 07/04/2022 0415   GLUCOSEU NEGATIVE 07/04/2022 0415   HGBUR SMALL (A) 07/04/2022 0415   BILIRUBINUR NEGATIVE 07/04/2022 0415   KETONESUR 5 (A) 07/04/2022 0415   PROTEINUR 100 (A) 07/04/2022 0415   UROBILINOGEN 0.2 03/11/2019 1835   NITRITE POSITIVE (A) 07/04/2022 0415   LEUKOCYTESUR LARGE (A) 07/04/2022 0415    Sepsis Labs: Lactic Acid, Venous    Component Value Date/Time   LATICACIDVEN 0.9 07/03/2022 1635    MICROBIOLOGY: Recent Results (from the past 240 hour(s))  Resp Panel by RT-PCR (Flu A&B, Covid) Anterior Nasal Swab     Status: Abnormal   Collection Time: 07/03/22  2:11 PM   Specimen: Anterior Nasal Swab  Result Value Ref Range Status   SARS Coronavirus 2 by RT PCR POSITIVE (A) NEGATIVE Final    Comment: (NOTE) SARS-CoV-2 target nucleic acids are DETECTED.  The SARS-CoV-2 RNA is generally detectable in upper respiratory specimens during the acute phase of infection. Positive results are indicative of the presence of the identified virus, but do not rule out bacterial infection or co-infection with other pathogens not detected by the test. Clinical correlation with patient history and other diagnostic information is necessary to determine patient infection status. The expected result is  Negative.  Fact Sheet for Patients: EntrepreneurPulse.com.au  Fact Sheet for Healthcare Providers: IncredibleEmployment.be  This test is not yet approved or cleared by the [place] FDA and  has been authorized for detection and/or diagnosis of SARS-CoV-2 by FDA under an Emergency Use Authorization (EUA).  This EUA will remain in effect (meaning this test can be used) for the duration of  the COVID-19 declaration under Section 564(b)(1) of the A ct, 21 U.S.C. section 360bbb-3(b)(1), unless the authorization is terminated or revoked sooner.     Influenza A  by PCR NEGATIVE NEGATIVE Final   Influenza B by PCR NEGATIVE NEGATIVE Final    Comment: (NOTE) The Xpert Xpress SARS-CoV-2/FLU/RSV plus assay is intended as an aid in the diagnosis of influenza from Nasopharyngeal swab specimens and should not be used as a sole basis for treatment. Nasal washings and aspirates are unacceptable for Xpert Xpress SARS-CoV-2/FLU/RSV testing.  Fact Sheet for Patients: EntrepreneurPulse.com.au  Fact Sheet for Healthcare Providers: IncredibleEmployment.be  This test is not yet approved or cleared by the [place] FDA and has been authorized for detection and/or diagnosis of SARS-CoV-2 by FDA under an Emergency Use Authorization (EUA). This EUA will remain in effect (meaning this test can be used) for the duration of the COVID-19 declaration under Section 564(b)(1) of the Act, 21 U.S.C. section 360bbb-3(b)(1), unless the authorization is terminated or revoked.  Performed at [facility], [place] [address] [place], [place] [address]     RADIOLOGY STUDIES/RESULTS: DG CHEST PORT 1 VIEW  Result Date: 07/06/2022 CLINICAL DATA:  Nausea and vomiting EXAM: PORTABLE CHEST 1 VIEW COMPARISON:  07/05/2022 FINDINGS: Cardiac shadow is stable. Aortic calcifications are again noted. The lungs are well aerated. No focal  infiltrate or effusion is seen. Chronic degenerative changes of the glenohumeral joints are seen. IMPRESSION: No acute abnormality noted.  No change from the previous day. Electronically Signed   By: Inez Catalina M.D.   On: 07/06/2022 01:26   DG Abd Portable 1V  Result Date: 07/06/2022 CLINICAL DATA:  Nausea and vomiting EXAM: PORTABLE ABDOMEN - 1 VIEW COMPARISON:  None Available. FINDINGS: Scattered large and small bowel gas is noted. Dilated loops of small bowel are noted deep within the pelvis. Tortuous abdominal aorta is noted. No free air is seen. No mass lesion is seen. Degenerative changes of lumbar spine are noted with scoliosis concave to the right. IMPRESSION: Dilated loops of small bowel deep in the pelvis. This may represent an ileus or small-bowel obstruction. CT may be helpful for further evaluation. Electronically Signed   By: Inez Catalina M.D.   On: 07/06/2022 00:44   DG Chest Port 1 View  Result Date: 07/05/2022 CLINICAL DATA:  Shortness of breath. EXAM: PORTABLE CHEST 1 VIEW COMPARISON:  July 04, 2022. FINDINGS: Stable cardiomediastinal silhouette. Lungs are clear. Severe degenerative changes seen involving both glenohumeral joints. IMPRESSION: No active disease. Electronically Signed   By: Marijo Conception M.D.   On: 07/05/2022 08:48     LOS: 2 days   Oren Binet, MD  [facility]    To contact the attending provider between 7A-7P or the covering provider during after hours 7P-7A, please log into the web site [email or web address] and access using universal [place] password for that web site. If you do not have the password, please call the hospital operator.  07/06/2022, 9:59 AM

## 2022-07-07 ENCOUNTER — Encounter (HOSPITAL_COMMUNITY): Payer: Medicare Other

## 2022-07-07 DIAGNOSIS — N179 Acute kidney failure, unspecified: Secondary | ICD-10-CM | POA: Diagnosis not present

## 2022-07-07 DIAGNOSIS — U071 COVID-19: Secondary | ICD-10-CM | POA: Diagnosis not present

## 2022-07-07 DIAGNOSIS — I5032 Chronic diastolic (congestive) heart failure: Secondary | ICD-10-CM | POA: Diagnosis not present

## 2022-07-07 DIAGNOSIS — I10 Essential (primary) hypertension: Secondary | ICD-10-CM | POA: Diagnosis not present

## 2022-07-07 LAB — BASIC METABOLIC PANEL
Anion gap: 12 (ref 5–15)
BUN: 16 mg/dL (ref 8–23)
CO2: 24 mmol/L (ref 22–32)
Calcium: 8.7 mg/dL — ABNORMAL LOW (ref 8.9–10.3)
Chloride: 97 mmol/L — ABNORMAL LOW (ref 98–111)
Creatinine, Ser: 0.75 mg/dL (ref 0.44–1.00)
GFR, Estimated: >60 mL/min (ref >60)
Glucose, Bld: 87 mg/dL (ref 70–99)
Potassium: 3.1 mmol/L — ABNORMAL LOW (ref 3.5–5.1)
Sodium: 133 mmol/L — ABNORMAL LOW (ref 135–145)

## 2022-07-07 LAB — C-REACTIVE PROTEIN: CRP: 13.3 mg/dL — ABNORMAL HIGH (ref <1.0)

## 2022-07-07 MED ORDER — POTASSIUM CHLORIDE 20 MEQ PO PACK
40.0000 meq | PACK | Freq: Once | ORAL | Status: AC
  Administered 2022-07-07: 40 meq via ORAL
  Filled 2022-07-07: qty 2

## 2022-07-07 MED ORDER — LOPERAMIDE HCL 2 MG PO CAPS
2.0000 mg | ORAL_CAPSULE | ORAL | Status: DC | PRN
  Administered 2022-07-07 – 2022-07-08 (×2): 2 mg via ORAL
  Filled 2022-07-07 (×3): qty 1

## 2022-07-07 NOTE — Progress Notes (Signed)
VASCULAR LAB    Doppler has been attempted 4 times between 11/26 and 11/27.  Each time the patient has been in the midst of patient care and/or eating.  I called RN at 1545 to ask if patient was available for exam.  Patient was eating and I was asked to wait at least 30 minutes for the patient to eat and to be cleaned up. The Vascular Lab is unable to re-attempt today and will attempt as schedule permits.       Mercede Rollo, RVT 07/07/2022, 3:56 PM

## 2022-07-07 NOTE — Progress Notes (Signed)
Pt refused mobility

## 2022-07-07 NOTE — Progress Notes (Signed)
  Transition of Care (TOC) Screening Note   Patient Details  Name: Cassandra Wu Date of Birth: 1924/12/05   [facility]) CM/SW Contact:    Cyndi Bender, RN Phone Number: 07/07/2022, 7:12 AM    Transition of Care Department [facility]) has reviewed patient and no TOC needs have been identified at this time. We will continue to monitor patient advancement through interdisciplinary progression rounds. If new patient transition needs arise, please place a TOC consult.

## 2022-07-07 NOTE — Progress Notes (Signed)
Speech Language Pathology Treatment: Dysphagia  Patient Details Name: Cassandra Wu MRN: [id number] DOB: 1925-08-06 Today's Date: 07/07/2022 Time: 9774-1423 SLP Time Calculation (min) (ACUTE ONLY): 18 min  Assessment / Plan / Recommendation Clinical Impression  Pt seen for dysphagia intervention with improvements in oropharyngeal swallow ability from initial assessment. Past several days there was question of SBO/ileus with continued vomiting however per MD note, scans were able to rule out an ileus. She was alert throughout session and observed with trials of thin liquids, puree and regular consistency. Upper and lower dentures donned without denture cream with adequate fit (pt stated she never wears denture cream and declined use of it) prior to regular consistency trials. Vocal quality clear at baseline and one instance of delayed cough noted after thin liquids, however, suspect due decreased respiratory status secondary to Covid vs aspiration. No other s/sx of aspiration noted, however, pt displayed brief oral holding with all consistencies requiring intermittent cues to initiate bolus propulsion.  Given reduction in s/sx of aspiration/dysphagia, recommend diet upgrade to thin liquid and dys 3 (question denture fit with tougher consistencies). Full supervision to monitor alertness, medications whole in puree. SLP to sign off at this time.    HPI HPI: Cassandra Wu is a 88 y.o. female with medical history significant of hypertension, TIA, anemia, diastolic CHF, lymphedema, history of mastectomy, breast cancer, GI bleed presenting with COVID and generalized decline. Admitted with cough, fever and unble to walk found to have COVID. Cough, followed by emesis, RT suctioned yellow thin liquid from airway. Pt made NPO with ST order.      SLP Plan  All goals met      Recommendations for follow up therapy are one component of a multi-disciplinary discharge planning process, led by the  attending physician.  Recommendations may be updated based on patient status, additional functional criteria and insurance authorization.    Recommendations  Diet recommendations: Dysphagia 3 (mechanical soft);Thin liquid Liquids provided via: Cup;Straw Medication Administration: Crushed with puree Supervision: Staff to assist with self feeding Compensations: Slow rate;Small sips/bites Postural Changes and/or Swallow Maneuvers: Seated upright 90 degrees                Oral Care Recommendations: Oral care BID Follow Up Recommendations: No SLP follow up Assistance recommended at discharge: Intermittent Supervision/Assistance SLP Visit Diagnosis: Dysphagia, unspecified (R13.10) Plan: All goals met           Cassandra Wu Student SLP   07/07/2022, 11:11 AM

## 2022-07-07 NOTE — Plan of Care (Signed)

## 2022-07-07 NOTE — Progress Notes (Signed)
PROGRESS NOTE        PATIENT DETAILS Name: Cassandra Wu Age: 88 y.o. Sex: female Date of Birth: August 20, 1924 Admit Date: 07/03/2022 Admitting Physician Marcelyn Bruins, MD [id number], Magdalene [place], FNP  Brief Summary: Patient is a 88 y.o.  female with history of HTN, chronic HFpEF, history of breast cancer (s/p mastectomy in 1998)-who developed COVID-19 infection a few days prior to this hospitalization-presented to the ED with weakness-she was subsequently admitted to the hospitalist service.  Post discharge-patient had a episode of coughing spells following which she vomited and aspirated.  See below for further details.  Significant events: 11/22>>covid +ve at home, started on Paxlovid 11/23>> to ED with generalized weakness/COVID infection-admit to [facility] 11/24>> vomiting after coughing spell-food particles aspirated from airway-suspicion for aspiration pneumonia-started on Unasyn. 11/25>> several vomiting episodes overnight-abdominal x-ray with possible ileus.  Significant studies: 01/02>> echo: EF 22-97%, grade 1 diastolic dysfunction. 11/23>> CXR: No PNA 11/24>> CXR: Chronic interstitial coarsening but no obvious infiltrates. 11/25>> CXR: No obvious PNA-some chronic interstitial changes. 11/26>> CT abdomen/pelvis: No ileus/no SBO.  Mild left hydronephrosis of uncertain etiology.  Left lung base PNA.  Significant microbiology data: 11/23>> COVID PCR: Positive 11/23>> influenza PCR: Negative  Procedures: None  Consults: None  Subjective: No vomiting overnight-tolerating liquid diet. Had a few episodes of loose stool yesterday-none since then. On room air He is overall much better.  Objective: Vitals: Blood pressure (!) 147/77, pulse 99, temperature 98 F (36.7 C), temperature source Oral, resp. rate (!) 23, height '5\' 1"'$  (1.549 m), weight 69.7 kg, SpO2 94 %.   Exam: Gen Exam:Alert awake-not in any distress HEENT:atraumatic,  normocephalic Chest: B/L clear to auscultation anteriorly CVS:S1S2 regular Abdomen:soft non tender, non distended Extremities:trace edema Neurology: Non focal Skin: no rash  Pertinent Labs/Radiology:    Latest Ref Rng & Units 07/05/2022    6:04 AM 07/04/2022    2:16 AM 07/03/2022   12:51 PM  CBC  WBC 4.0 - 10.5 K/uL 7.9  7.6  3.0   Hemoglobin 12.0 - 15.0 g/dL 10.2  10.0  9.2   Hematocrit 36.0 - 46.0 % 30.7  30.6  27.4   Platelets 150 - 400 K/uL 215  220  193     Lab Results  Component Value Date   NA 133 (L) 07/07/2022   K 3.1 (L) 07/07/2022   CL 97 (L) 07/07/2022   CO2 24 07/07/2022      Assessment/Plan: Acute hypoxic respiratory failure likely due to aspiration pneumonia Much improved Hypoxia resolved-on room air Completed Rocephin/Flagyl x 5 days Following.  COVID-19 infection Continue Paxlovid PNA felt to be more from aspiration pneumonia CRP downtrending On room air No indication for steroids  Vomiting Unclear etiology-could be simply from COVID-19 infection CT abdomen without SBO/ileus Tolerated clear liquids on 11/26-okay to advance diet per SLP today.  Minimally elevated D-dimer Due to COVID-19 infection Low suspicion for VTE given improving hypoxemia Check Dopplers lower extremity On prophylactic Lovenox  AKI Mild Likely hemodynamically mediated Resolved  Chronic HFpEF Volume status stable Has some puffy legs/mild edema at baseline Continue furosemide  HTN BP stable-continue metoprolol   Normocytic anemia Chronic issue Follows with Dr. Lorenso Courier as an outpatient Follow CBC periodically  Remote history of breast cancer s/p mastectomy 1998-followed by 5 years of tamoxifen  Severe debility/deconditioning Due to COVID-19 infection/acute illness PT/OT following-SNF recommended  Discussed with daughter Doristine Bosworth today-family agreeable for SNF.  Palliative care See prior notes  DNR status  If worsens-May need to start hospice discussion  with family  However-Seems to have improved significantly overnight Per patient's daughter-patient is being followed by palliative care as an outpatient.  BMI: Estimated body mass index is 29.03 kg/m as calculated from the following:   Height as of this encounter: '5\' 1"'$  (1.549 m).   Weight as of this encounter: 69.7 kg.   Code status:   Code Status: DNR   DVT Prophylaxis: enoxaparin (LOVENOX) injection 40 mg Start: 07/03/22 1700    Family Communication:  [place] over the phone on 11/27-she will update rest of the family members.   Disposition Plan: Status is: Observation The patient will require care spanning > 2 midnights and should be moved to inpatient because: COVID-severe debility/weakness-aspiration event overnight requiring oxygen.   Planned Discharge Destination:Skilled nursing facility   Diet: Diet Order             DIET DYS 3 Room service appropriate? No; Fluid consistency: Thin  Diet effective now                     Antimicrobial agents: Anti-infectives (From admission, onward)    Start     Dose/Rate Route Frequency Ordered Stop   07/04/22 0600  cefTRIAXone (ROCEPHIN) 2 g in sodium chloride 0.9 % 100 mL IVPB        2 g 200 mL/hr over 30 Minutes Intravenous Daily 07/04/22 0428     07/04/22 0600  metroNIDAZOLE (FLAGYL) IVPB 500 mg        500 mg 100 mL/hr over 60 Minutes Intravenous 2 times daily 07/04/22 0428     07/03/22 2200  nirmatrelvir/ritonavir EUA (PAXLOVID) 3 tablet  Status:  Discontinued        3 tablet Oral 2 times daily 07/03/22 1656 07/03/22 2016   07/03/22 2200  nirmatrelvir/ritonavir EUA (renal dosing) (PAXLOVID) 2 tablet        2 tablet Oral 2 times daily 07/03/22 2016 07/06/22 2359        MEDICATIONS: Scheduled Meds:  enoxaparin (LOVENOX) injection  40 mg Subcutaneous Q24H   furosemide  40 mg Oral Daily   metoprolol succinate  25 mg Oral Daily   mouth rinse  15 mL Mouth Rinse 4 times per day    pantoprazole  40 mg Oral Daily   sodium chloride flush  3 mL Intravenous Q12H   Continuous Infusions:  cefTRIAXone (ROCEPHIN)  IV 2 g (07/07/22 0521)   metronidazole 500 mg (07/07/22 0818)   PRN Meds:.acetaminophen **OR** acetaminophen, loperamide, ondansetron (ZOFRAN) IV, mouth rinse, polyethylene glycol   I have personally reviewed following labs and imaging studies  LABORATORY DATA: CBC: Recent Labs  Lab 07/03/22 1251 07/04/22 0216 07/05/22 0604  WBC 3.0* 7.6 7.9  NEUTROABS 1.8  --   --   HGB 9.2* 10.0* 10.2*  HCT 27.4* 30.6* 30.7*  MCV 82.0 81.8 81.6  PLT 193 220 215     Basic Metabolic Panel: Recent Labs  Lab 07/03/22 1251 07/04/22 0216 07/05/22 0239 07/06/22 0237 07/07/22 0250  NA 130* 132* 134* 134* 133*  K 3.9 3.9 3.5 4.0 3.1*  CL 92* 92* 92* 96* 97*  CO2 '26 26 26 23 24  '$ GLUCOSE 97 104* 126* 104* 87  BUN 15 15 26* 26* 16  CREATININE 0.99 0.98 1.47* 1.12* 0.75  CALCIUM 9.0 9.2 9.5 8.7* 8.7*  MG  --   --   --  1.6*  --      GFR: Estimated Creatinine Clearance: 37.6 mL/min (by C-G formula based on SCr of 0.75 mg/dL).  Liver Function Tests: Recent Labs  Lab 07/03/22 1251 07/04/22 0216 07/05/22 0239  AST '23 26 25  '$ ALT '12 14 14  '$ ALKPHOS 67 68 65  BILITOT 0.8 0.9 0.5  PROT 6.9 7.3 6.9  ALBUMIN 3.0* 3.1* 2.7*    No results for input(s): "LIPASE", "AMYLASE" in the last 168 hours. No results for input(s): "AMMONIA" in the last 168 hours.  Coagulation Profile: No results for input(s): "INR", "PROTIME" in the last 168 hours.  Cardiac Enzymes: No results for input(s): "CKTOTAL", "CKMB", "CKMBINDEX", "TROPONINI" in the last 168 hours.  BNP (last 3 results) No results for input(s): "PROBNP" in the last 8760 hours.  Lipid Profile: No results for input(s): "CHOL", "HDL", "LDLCALC", "TRIG", "CHOLHDL", "LDLDIRECT" in the last 72 hours.  Thyroid Function Tests: No results for input(s): "TSH", "T4TOTAL", "FREET4", "T3FREE", "THYROIDAB" in the  last 72 hours.  Anemia Panel: No results for input(s): "VITAMINB12", "FOLATE", "FERRITIN", "TIBC", "IRON", "RETICCTPCT" in the last 72 hours.   Urine analysis:    Component Value Date/Time   COLORURINE AMBER (A) 07/04/2022 0415   APPEARANCEUR CLOUDY (A) 07/04/2022 0415   LABSPEC 1.015 07/04/2022 0415   PHURINE 5.0 07/04/2022 0415   GLUCOSEU NEGATIVE 07/04/2022 0415   HGBUR SMALL (A) 07/04/2022 0415   BILIRUBINUR NEGATIVE 07/04/2022 0415   KETONESUR 5 (A) 07/04/2022 0415   PROTEINUR 100 (A) 07/04/2022 0415   UROBILINOGEN 0.2 03/11/2019 1835   NITRITE POSITIVE (A) 07/04/2022 0415   LEUKOCYTESUR LARGE (A) 07/04/2022 0415    Sepsis Labs: Lactic Acid, Venous    Component Value Date/Time   LATICACIDVEN 0.9 07/03/2022 1635    MICROBIOLOGY: Recent Results (from the past 240 hour(s))  Resp Panel by RT-PCR (Flu A&B, Covid) Anterior Nasal Swab     Status: Abnormal   Collection Time: 07/03/22  2:11 PM   Specimen: Anterior Nasal Swab  Result Value Ref Range Status   SARS Coronavirus 2 by RT PCR POSITIVE (A) NEGATIVE Final    Comment: (NOTE) SARS-CoV-2 target nucleic acids are DETECTED.  The SARS-CoV-2 RNA is generally detectable in upper respiratory specimens during the acute phase of infection. Positive results are indicative of the presence of the identified virus, but do not rule out bacterial infection or co-infection with other pathogens not detected by the test. Clinical correlation with patient history and other diagnostic information is necessary to determine patient infection status. The expected result is Negative.  Fact Sheet for Patients: EntrepreneurPulse.com.au  Fact Sheet for Healthcare Providers: IncredibleEmployment.be  This test is not yet approved or cleared by the [place] FDA and  has been authorized for detection and/or diagnosis of SARS-CoV-2 by FDA under an Emergency Use Authorization (EUA).  This EUA  will remain in effect (meaning this test can be used) for the duration of  the COVID-19 declaration under Section 564(b)(1) of the A ct, 21 U.S.C. section 360bbb-3(b)(1), unless the authorization is terminated or revoked sooner.     Influenza A by PCR NEGATIVE NEGATIVE Final   Influenza B by PCR NEGATIVE NEGATIVE Final    Comment: (NOTE) The Xpert Xpress SARS-CoV-2/FLU/RSV plus assay is intended as an aid in the diagnosis of influenza from Nasopharyngeal swab specimens and should not be used as a sole basis for treatment. Nasal washings and aspirates are unacceptable for Xpert Xpress SARS-CoV-2/FLU/RSV testing.  Fact Sheet for Patients: EntrepreneurPulse.com.au  Fact Sheet  for Healthcare Providers: IncredibleEmployment.be  This test is not yet approved or cleared by the [place] and has been authorized for detection and/or diagnosis of SARS-CoV-2 by FDA under an Emergency Use Authorization (EUA). This EUA will remain in effect (meaning this test can be used) for the duration of the COVID-19 declaration under Section 564(b)(1) of the Act, 21 U.S.C. section 360bbb-3(b)(1), unless the authorization is terminated or revoked.  Performed at [facility], [place] [address], [place], [place] [address]     RADIOLOGY STUDIES/RESULTS: CT ABDOMEN PELVIS WO CONTRAST  Result Date: 07/06/2022 CLINICAL DATA:  Suspected bowel obstruction EXAM: CT ABDOMEN AND PELVIS WITHOUT CONTRAST TECHNIQUE: Multidetector CT imaging of the abdomen and pelvis was performed following the standard protocol without IV contrast. RADIATION DOSE REDUCTION: This exam was performed according to the departmental dose-optimization program which includes automated exposure control, adjustment of the mA and/or kV according to patient size and/or use of iterative reconstruction technique. COMPARISON:  CT 12/22/2007 FINDINGS: Lower chest: No pleural or pericardial effusion.  Scattered aortic calcifications. Patchy airspace infiltrates in the posteromedial left lung base. Hepatobiliary: At least 2 partially calcified gallstones of measure up to 1.8 cm in the physiologically distended gallbladder. No focal liver lesion or biliary ductal dilatation evident. Pancreas: Unremarkable. No pancreatic ductal dilatation or surrounding inflammatory changes. Spleen: Normal in size without focal abnormality. Adrenals/Urinary Tract: No nephrolithiasis. There is mild left hydronephrosis and ureterectasis. Urinary bladder is incompletely distended, with a small diverticulum from the dome. Stomach/Bowel: Stomach is partially distended, unremarkable. There is a single loop of small bowel dilated to 5 cm in the right mid abdomen, and other scattered mildly distended loops, but good distal passage of oral contrast material to the colon. The colon is nondilated, unremarkable. Vascular/Lymphatic: Moderate aortoiliac calcified atheromatous plaque without aneurysm. No definite abdominal or pelvic adenopathy. Reproductive: Uterus and bilateral adnexa are unremarkable. Other: Multiple pelvic phleboliths.  No ascites.  No free air. Musculoskeletal: Small amount of subcutaneous gas in the right mid body wall presumably related to subcutaneous injections. 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 Lumbar levoscoliosis with multilevel spondylitic change. Grade 1/2 anterolisthesis L3-4 with spinal stenosis. IMPRESSION: 1. Single dilated loop of small bowel in the right mid abdomen, but good distal passage of oral contrast material to the colon. 2. Mild left hydronephrosis and ureterectasis, of uncertain etiology. No nephrolithiasis. 3. Cholelithiasis. 4. Patchy airspace infiltrates in the posteromedial left lung base, possibly pneumonia. 5. Lumbar levoscoliosis, spondylitic change, and spinal stenosis L3-4. Aortic Atherosclerosis (ICD10-I70.0). Electronically Signed   By: Lucrezia Europe M.D.   On: 07/06/2022 19:32   DG  CHEST PORT 1 VIEW  Result Date: 07/06/2022 CLINICAL DATA:  Nausea and vomiting EXAM: PORTABLE CHEST 1 VIEW COMPARISON:  07/05/2022 FINDINGS: Cardiac shadow is stable. Aortic calcifications are again noted. The lungs are well aerated. No focal infiltrate or effusion is seen. Chronic degenerative changes of the glenohumeral joints are seen. IMPRESSION: No acute abnormality noted.  No change from the previous day. Electronically Signed   By: Inez Catalina M.D.   On: 07/06/2022 01:26   DG Abd Portable 1V  Result Date: 07/06/2022 CLINICAL DATA:  Nausea and vomiting EXAM: PORTABLE ABDOMEN - 1 VIEW COMPARISON:  None Available. FINDINGS: Scattered large and small bowel gas is noted. Dilated loops of small bowel are noted deep within the pelvis. Tortuous abdominal aorta is noted. No free air is seen. No mass lesion is seen. Degenerative changes of lumbar spine are noted with scoliosis  concave to the right. IMPRESSION: Dilated loops of small bowel deep in the pelvis. This may represent an ileus or small-bowel obstruction. CT may be helpful for further evaluation. Electronically Signed   By: Inez Catalina M.D.   On: 07/06/2022 00:44     LOS: 3 days   Oren Binet, MD  [facility]    To contact the attending provider between 7A-7P or the covering provider during after hours 7P-7A, please log into the web site [email or web address] and access using universal [email or web address] password for that web site. If you do not have the password, please call the hospital operator.  07/07/2022, 9:45 AM

## 2022-07-07 NOTE — NC FL2 (Signed)
[place] MEDICAID FL2 LEVEL OF CARE SCREENING TOOL     IDENTIFICATION  Patient Name: Cassandra Wu Birthdate: 13-Sep-1924 Sex: female Admission Date (Current Location): 07/03/2022  [facility] and [place] Number:  Herbalist and Address:  [facility] [place], [place] [address] [place],  [address]      Provider Number: [id number]  Attending Physician Name and Address:  Jonetta Osgood, MD  Relative Name and Phone Number:       Current Level of Care: Hospital Recommended Level of Care: [place] Prior Approval Number:    Date Approved/Denied:   PASRR Number: [id number]  Discharge Plan: SNF    Current Diagnoses: Patient Active Problem List   Diagnosis Date Noted   Generalized weakness 07/03/2022   COVID-19 virus infection 07/03/2022   Chronic diastolic (congestive) heart failure ([place]) 08/13/2021   Acute gastric ulcer with hemorrhage    Heme positive stool    GI bleed 08/10/2021   Anemia 08/10/2021   Dyspnea 08/10/2021   Overweight (BMI 25.0-29.9) 08/10/2021   CARPAL TUNNEL SYNDROME, RIGHT 07/16/2010   Essential hypertension 03/19/2010   History of TIA (transient ischemic attack) 03/19/2010   LYMPHEDEMA, LEFT ARM 03/19/2010   DEPENDENT EDEMA, LEGS, BILATERAL 03/19/2010   OTHER CHEST PAIN 03/19/2010   MASTECTOMY, BILATERAL, HX OF 03/19/2010    Orientation RESPIRATION BLADDER Height & Weight     Self, Time, Situation, Place  Normal Incontinent, External catheter Weight: 153 lb 10.6 oz (69.7 kg) Height:  '5\' 1"'$  (154.9 cm)  BEHAVIORAL SYMPTOMS/MOOD NEUROLOGICAL BOWEL NUTRITION STATUS      Incontinent Diet (See dc summary)  AMBULATORY STATUS COMMUNICATION OF NEEDS Skin   Extensive Assist Verbally Normal                       Personal Care Assistance Level of Assistance  Bathing, Feeding, Dressing Bathing Assistance: Maximum assistance Feeding assistance: Limited assistance Dressing Assistance: Limited  assistance     Functional Limitations Info             SPECIAL CARE FACTORS FREQUENCY  PT (By licensed PT), OT (By licensed OT)     PT Frequency: 5x/week OT Frequency: 5x/week            Contractures Contractures Info: Not present    Additional Factors Info  Code Status, Allergies, Isolation Precautions Code Status Info: DNR Allergies Info: Penicillins, Propranolol Hcl     Isolation Precautions Info: COVID + on 07/03/22     Current Medications (07/07/2022):  This is the current hospital active medication list Current Facility-Administered Medications  Medication Dose Route Frequency Provider Last Rate Last Admin   acetaminophen (TYLENOL) tablet 650 mg  650 mg Oral Q6H PRN Marcelyn Bruins, MD   650 mg at 07/06/22 2003   Or   acetaminophen (TYLENOL) suppository 650 mg  650 mg Rectal Q6H PRN Marcelyn Bruins, MD       cefTRIAXone (ROCEPHIN) 2 g in sodium chloride 0.9 % 100 mL IVPB  2 g Intravenous Q0600 Jonetta Osgood, MD 200 mL/hr at 07/07/22 0521 2 g at 07/07/22 0521   enoxaparin (LOVENOX) injection 40 mg  40 mg Subcutaneous Q24H Marcelyn Bruins, MD   40 mg at 07/06/22 1723   furosemide (LASIX) tablet 40 mg  40 mg Oral Daily Jonetta Osgood, MD   40 mg at 07/07/22 0827   loperamide (IMODIUM) capsule 2 mg  2 mg Oral PRN Jonetta Osgood,  MD       metoprolol succinate (TOPROL-XL) 24 hr tablet 25 mg  25 mg Oral Daily Marcelyn Bruins, MD   25 mg at 07/07/22 0828   metroNIDAZOLE (FLAGYL) IVPB 500 mg  500 mg Intravenous BID Jonetta Osgood, MD 100 mL/hr at 07/07/22 0818 500 mg at 07/07/22 0818   ondansetron (ZOFRAN) injection 4 mg  4 mg Intravenous Q6H PRN Jonetta Osgood, MD   4 mg at 07/05/22 1834   Oral care mouth rinse  15 mL Mouth Rinse 4 times per day Jonetta Osgood, MD   15 mL at 07/07/22 1125   Oral care mouth rinse  15 mL Mouth Rinse PRN Jonetta Osgood, MD       pantoprazole (PROTONIX) EC tablet 40 mg  40 mg Oral Daily Marcelyn Bruins, MD   40 mg at 07/07/22 0815   polyethylene glycol (MIRALAX / GLYCOLAX) packet 17 g  17 g Oral Daily PRN Marcelyn Bruins, MD       sodium chloride flush (NS) 0.9 % injection 3 mL  3 mL Intravenous Q12H Marcelyn Bruins, MD   3 mL at 07/07/22 0820     Discharge Medications: Please see discharge summary for a list of discharge medications.  Relevant Imaging Results:  Relevant Lab Results:   Additional Information SSN: [id number]  Benard Halsted, LCSW

## 2022-07-07 NOTE — TOC Initial Note (Signed)
Transition of Care [facility]) - Initial/Assessment Note    Patient Details  Name: Cassandra Wu MRN: [id number] Date of Birth: 31-Oct-1924  Transition of Care [facility]) CM/SW Contact:    Benard Halsted, LCSW Phone Number: 07/07/2022, 2:07 PM  Clinical Narrative:                 CSW received consult for possible SNF placement at time of discharge. CSW spoke with patient's daughter, Cassandra Wu. She expressed understanding of PT recommendation and is agreeable to SNF placement at time of discharge. Patient reports preference for [facility] (and not [facility]). CSW discussed insurance authorization process and will provide Medicare SNF ratings list. CSW will send out referrals for review and provide bed offers as available; COVID status may be a barrier.   Skilled Nursing Rehab Facilities-   [email or web address]   Ratings out of 5 stars (5 the highest)   Name Address  Phone # [place] Inspection Overall  [facility] [address], [place] '4 5 2 3  '$ [facility]  [address], [place] [phone] '4 2 5 5  '$ [facility] [place], [place] '1 3 1 1  '$ [place], [place] '2 2 4 4  '$ [facility] [address], [place] '2 1 2 1  '$ [place] '3 3 4 4  '$ [facility] [address], [place] '4 1 3 2  '$ [facility] [address] [place] '4 1 3 2  '$ [address] ([place]) [place], [place] [phone] '3 1 2 1  '$ [facility] [phone], [place] [phone] '3 1 1 1  '$ [facility] [address], [place] [facility] [phone] '3 2 2 2  '$ [facility] ([place]) [address] [place], [place] [phone] '3 1 1 1  '$ [facility] [address] [place] [phone] '4 2 4 4          '$ [facility] [place] '4 1 3 2  '$ [facility] [address] [place] '3 1 5 4  '$ [address] [place], [place] [phone] '1 1 2 1  '$ [facility] [address], [employer] [phone] '2 2 4 4          '$ [address] (no [facility]) [address], [place] [phone] '5 5 5 5  '$ [facility] (No Hum[employer]) [address], [place] '4 1 4 3  '$ [facility] (No [employer]) [place], [place] '5 5 5 5  '$ [facility] [address], [employer] [phone] '2 3 5 5  '$ [place] [address] [place] '1 1 2 1  '$ [facility] [address] [place] [phone] '3 1 1 1  '$ [place], [place] '5 2 5 5  '$ [facility]  [address] [place] '2 2 1 1  '$ [facility] [address], [place] '3 2 1 1  '$ [facility] [place], [place] '2 2 2 2          '$ [facility] [place] '1 1 1 1  '$ [facility] [address], [place]  [phone] '2 4 3 3  '$ [facility] [place] [address], [place] [phone] '3 2 3 '$ 3  [place] [address], [place] '2 1 1 1  '$ [place] (No Humana) [address] [place], [place] [phone] '2 2 3 3  '$ [facility]) [address], [place] [phone] '2 1 1 1          '$ [facility] [place], [place] '5 4 5 5  '$ [facility])  [address], [place] '2 1 2 1  '$ [facility]) [place] [address], [place] '3 1 4 3  '$ [place] [address] [place] '3 3 4 4  '$ [address] [place], [place] '2 3 1 1  '$ [facility]) [address] [place] [phone]  '2 1 4 3     '$ Expected Discharge Plan: [place] Barriers to Discharge: Continued Medical Work up, Ship broker, SNF Pending bed offer   Patient Goals and CMS Choice Patient states their goals for this hospitalization and ongoing recovery are:: Rehab CMS Medicare.gov Compare Post Acute Care list provided to:: Patient Represenative (must comment) Choice offered to / list presented to : Adult Children  Expected Discharge Plan and Services Expected Discharge Plan: [place] In-house Referral: Clinical Social Work   Post Acute Care Choice: [place] Living arrangements for the past 2 months: [place]                                      Prior Living Arrangements/Services Living arrangements for the past 2 months: Single Family Home Lives with:: Self Patient language and need for interpreter reviewed:: Yes Do you feel safe going back to the place where you live?: Yes      Need for Family Participation in Patient Care: Yes (Comment) Care giver support system in place?: Yes (comment) Current home services: Homehealth aide Criminal Activity/Legal Involvement Pertinent to Current Situation/Hospitalization: No - Comment as needed  Activities of Daily Living Home Assistive Devices/Equipment: Environmental consultant (specify type), Wheelchair ADL Screening (condition at time of admission) Patient's cognitive ability adequate to safely complete daily activities?: Yes Is the patient deaf or have difficulty hearing?: No Does the patient have difficulty seeing, even when wearing glasses/contacts?: No Does the patient have difficulty concentrating, remembering, or making decisions?: No Patient able to express need for assistance with ADLs?: Yes Does the patient have difficulty dressing or bathing?: Yes Independently performs ADLs?: No Communication: Independent Dressing (OT): Needs assistance Is this a change from baseline?:  Pre-admission baseline Grooming: Needs assistance Is this a change from baseline?: Pre-admission baseline Feeding: Independent Bathing: Needs assistance Is this a change from baseline?: Pre-admission baseline Toileting: Needs assistance Is this a change from baseline?: Pre-admission baseline In/Out Bed: Needs assistance Is this a change from baseline?: Pre-admission baseline Walks in Home: Needs assistance Is this a change from baseline?: Pre-admission baseline Does the patient have difficulty walking or climbing stairs?: Yes Weakness of Legs: Both Weakness of Arms/Hands: None  Permission Sought/Granted Permission sought to share information with : Facility Sport and exercise psychologist, Family Supports Permission granted to share information with : Yes, Verbal Permission Granted  Share Information with NAME: Ahmedta  Permission granted to share info w AGENCY: SNFs  Permission granted to share info w Relationship: Daughter  Permission granted to share info w Contact Information: [phone]  Emotional Assessment Appearance:: Appears stated age Attitude/Demeanor/Rapport: Unable to Assess Affect (typically  observed): Unable to Assess (COVID+) Orientation: : Oriented to Self, Oriented to Place, Oriented to  Time, Oriented to Situation Alcohol / Substance Use: Not Applicable Psych Involvement: No (comment)  Admission diagnosis:  General weakness [R53.1] Advanced age [R54] COVID [U07.1] COVID-19 virus infection [U07.1] Patient Active Problem List   Diagnosis Date Noted   Generalized weakness 07/03/2022   COVID-19 virus infection 07/03/2022   Chronic diastolic (congestive) heart failure ([place]) 08/13/2021   Acute gastric ulcer with hemorrhage    Heme positive stool    GI bleed 08/10/2021   Anemia 08/10/2021   Dyspnea 08/10/2021   Overweight (BMI 25.0-29.9) 08/10/2021   CARPAL TUNNEL SYNDROME, RIGHT 07/16/2010   Essential hypertension 03/19/2010   History of TIA (transient ischemic  attack) 03/19/2010   LYMPHEDEMA, LEFT ARM 03/19/2010   DEPENDENT EDEMA, LEGS, BILATERAL 03/19/2010   OTHER CHEST PAIN 03/19/2010   MASTECTOMY, BILATERAL, HX OF 03/19/2010   PCP:  Cornelious Bryant, FNP Pharmacy[facility]   [id number] [address]Phone: [phone]:: [phone]    Social Determinants of Health (SDOH) Interventions    Readmission Risk Interventions     No data to display

## 2022-07-08 DIAGNOSIS — Z9013 Acquired absence of bilateral breasts and nipples: Secondary | ICD-10-CM | POA: Diagnosis not present

## 2022-07-08 DIAGNOSIS — J9601 Acute respiratory failure with hypoxia: Secondary | ICD-10-CM | POA: Diagnosis not present

## 2022-07-08 DIAGNOSIS — J690 Pneumonitis due to inhalation of food and vomit: Secondary | ICD-10-CM | POA: Diagnosis not present

## 2022-07-08 DIAGNOSIS — U071 COVID-19: Secondary | ICD-10-CM | POA: Diagnosis not present

## 2022-07-08 LAB — BASIC METABOLIC PANEL
Anion gap: 15 (ref 5–15)
BUN: 10 mg/dL (ref 8–23)
CO2: 23 mmol/L (ref 22–32)
Calcium: 8.6 mg/dL — ABNORMAL LOW (ref 8.9–10.3)
Chloride: 96 mmol/L — ABNORMAL LOW (ref 98–111)
Creatinine, Ser: 0.7 mg/dL (ref 0.44–1.00)
GFR, Estimated: >60 mL/min (ref >60)
Glucose, Bld: 79 mg/dL (ref 70–99)
Potassium: 3.1 mmol/L — ABNORMAL LOW (ref 3.5–5.1)
Sodium: 134 mmol/L — ABNORMAL LOW (ref 135–145)

## 2022-07-08 LAB — MAGNESIUM: Magnesium: 1.6 mg/dL — ABNORMAL LOW (ref 1.7–2.4)

## 2022-07-08 LAB — C-REACTIVE PROTEIN: CRP: 12 mg/dL — ABNORMAL HIGH (ref <1.0)

## 2022-07-08 MED ORDER — MAGNESIUM SULFATE 4 GM/100ML IV SOLN
4.0000 g | Freq: Once | INTRAVENOUS | Status: AC
  Administered 2022-07-08: 4 g via INTRAVENOUS
  Filled 2022-07-08: qty 100

## 2022-07-08 MED ORDER — ENSURE ENLIVE PO LIQD
237.0000 mL | Freq: Two times a day (BID) | ORAL | Status: DC
  Administered 2022-07-08 – 2022-07-15 (×15): 237 mL via ORAL

## 2022-07-08 MED ORDER — METOPROLOL SUCCINATE ER 25 MG PO TB24
25.0000 mg | ORAL_TABLET | Freq: Once | ORAL | Status: AC
  Administered 2022-07-08: 25 mg via ORAL
  Filled 2022-07-08: qty 1

## 2022-07-08 MED ORDER — METOPROLOL SUCCINATE ER 50 MG PO TB24
50.0000 mg | ORAL_TABLET | Freq: Every day | ORAL | Status: DC
  Administered 2022-07-09 – 2022-07-15 (×7): 50 mg via ORAL
  Filled 2022-07-08 (×7): qty 1

## 2022-07-08 MED ORDER — POTASSIUM CHLORIDE 20 MEQ PO PACK
40.0000 meq | PACK | ORAL | Status: AC
  Administered 2022-07-08 (×2): 40 meq via ORAL
  Filled 2022-07-08 (×2): qty 2

## 2022-07-08 MED ORDER — HYDRALAZINE HCL 20 MG/ML IJ SOLN
10.0000 mg | INTRAMUSCULAR | Status: DC | PRN
  Filled 2022-07-08: qty 1

## 2022-07-08 NOTE — Progress Notes (Signed)
Physical Therapy Treatment Patient Details Name: Cassandra Wu MRN: [id number] DOB: [birth year] Today's Date: 07/08/2022   History of Present Illness 88 y.o. female with medical history significant of hypertension, TIA, anemia, diastolic CHF, lymphedema, history of mastectomy, history of breast cancer, history of GI bleed presenting 11/23 with COVID and generalized decline.    PT Comments    Pt was seen for tx in bed with pt being very resistive to roll and move to reposition.  Pt is more amenable to moving legs to do ROM and strengthening, but is reporting stiffness generally to legs.  Pt is appropriate for SNF care as she is quite limited and fearful to move, and will need extra time to recover to get home again.  Follow acutely to attain goals of acute PT as are outlined on POC, with attention to sitting balance and to try to stand when able.   Recommendations for follow up therapy are one component of a multi-disciplinary discharge planning process, led by the attending physician.  Recommendations may be updated based on patient status, additional functional criteria and insurance authorization.  Follow Up Recommendations  Skilled nursing-short term rehab (<3 hours/day) Can patient physically be transported by private vehicle: No   Assistance Recommended at Discharge Frequent or constant Supervision/Assistance  Patient can return home with the following Two people to help with walking and/or transfers;Two people to help with bathing/dressing/bathroom;Assist for transportation;Help with stairs or ramp for entrance;Assistance with cooking/housework   Equipment Recommendations  None recommended by PT    Recommendations for Other Services       Precautions / Restrictions Precautions Precautions: Fall Restrictions Weight Bearing Restrictions: No     Mobility  Bed Mobility Overal bed mobility: Needs Assistance Bed Mobility: Rolling (scooting up) Rolling: Max assist          General bed mobility comments: max with trendelenburg to scoot up    Transfers                   General transfer comment: declines OOB    Ambulation/Gait                   Stairs             Wheelchair Mobility    Modified Rankin (Stroke Patients Only)       Balance                                            Cognition Arousal/Alertness: Lethargic Behavior During Therapy: Flat affect Overall Cognitive Status: Impaired/Different from baseline Area of Impairment: Problem solving, Awareness, Safety/judgement, Following commands, Memory, Attention, Orientation                 Orientation Level: Time, Situation Current Attention Level: Selective Memory: Decreased recall of precautions, Decreased short-term memory Following Commands: Follows one step commands with increased time Safety/Judgement: Decreased awareness of deficits, Decreased awareness of safety Awareness: Intellectual Problem Solving: Slow processing, Requires verbal cues, Requires tactile cues General Comments: pt is actively resisting being moved on the bed, struggle to get her to assist with any movement        Exercises General Exercises - Lower Extremity Ankle Circles/Pumps: AAROM, 5 reps Quad Sets: AROM, 10 reps Heel Slides: AAROM, 10 reps Hip ABduction/ADduction: AROM, AAROM, 10 reps    General Comments General comments (skin integrity, edema, etc.): pt was  seen for bed ex and ex to BLE's with pt reporting fatigue and skin irritation on peri area before tx ended      Pertinent Vitals/Pain Pain Assessment Pain Assessment: Faces Faces Pain Scale: Hurts little more Pain Location: peri area with cleaning up Pain Descriptors / Indicators: Burning, Tender Pain Intervention(s): Monitored during session, Repositioned    Home Living                          Prior Function            PT Goals (current goals can now be found in the  care plan section) Acute Rehab PT Goals Patient Stated Goal: feel better    Frequency    Min 2X/week      PT Plan Current plan remains appropriate    Co-evaluation              AM-PAC PT "6 Clicks" Mobility   Outcome Measure  Help needed turning from your back to your side while in a flat bed without using bedrails?: A Lot Help needed moving from lying on your back to sitting on the side of a flat bed without using bedrails?: Total Help needed moving to and from a bed to a chair (including a wheelchair)?: Total Help needed standing up from a chair using your arms (e.g., wheelchair or bedside chair)?: Total Help needed to walk in hospital room?: Total Help needed climbing 3-5 steps with a railing? : Total 6 Click Score: 7    End of Session   Activity Tolerance: Patient limited by fatigue;Patient limited by pain Patient left: in bed;with call bell/phone within reach;with bed alarm set Nurse Communication: Mobility status PT Visit Diagnosis: Other abnormalities of gait and mobility (R26.89);Muscle weakness (generalized) (M62.81);Other symptoms and signs involving the nervous system (R29.898)     Time: 0712-1975 PT Time Calculation (min) (ACUTE ONLY): 41 min  Charges:  $Therapeutic Exercise: 8-22 mins $Therapeutic Activity: 8-22 mins       Ramond Dial 07/08/2022, 4:42 PM  Mee Hives, PT PhD Acute Rehab Dept. Number: [place]

## 2022-07-08 NOTE — Progress Notes (Signed)
TRH night cross cover note:   I was notified by RN of patient's most recent BP of 171/98. Not a/w any new symptoms. HR's in the 80's.   Per my chart review, patient's most recent BP running slightly higher than BP's earlier on 11/27, with previously noted SBP's as high as the 160's mmHg. Has h/o HTN and is currently on BB. I added prn IV hydralazine for SBP > 180 mmHg or DBP > 120 mmHg.      Babs Bertin, DO Hospitalist

## 2022-07-08 NOTE — Progress Notes (Addendum)
PROGRESS NOTE        PATIENT DETAILS Name: Cassandra Wu Age: 88 y.o. Sex: female Date of Birth: 08-09-1925 Admit Date: 07/03/2022 Admitting Physician Marcelyn Bruins, MD TSV:[id number], Magdalene River, FNP  Brief Summary: Patient is a 88 y.o.  female with history of HTN, chronic HFpEF, history of breast cancer (s/p mastectomy in 1998)-who developed COVID-19 infection a few days prior to this hospitalization-presented to the ED with weakness-she was subsequently admitted to the hospitalist service.  Post discharge-patient had a episode of coughing spells following which she vomited and aspirated.  See below for further details.  Significant events: 11/22>>covid +ve at home, started on Paxlovid 11/23>> to ED with generalized weakness/COVID infection-admit to [facility] 11/24>> vomiting after coughing spell-food particles aspirated from airway-suspicion for aspiration pneumonia-started on Unasyn. 11/25>> several vomiting episodes overnight-abdominal x-ray with possible ileus.  Significant studies: 01/02>> echo: EF 33-00%, grade 1 diastolic dysfunction. 11/23>> CXR: No PNA 11/24>> CXR: Chronic interstitial coarsening but no obvious infiltrates. 11/25>> CXR: No obvious PNA-some chronic interstitial changes. 11/26>> CT abdomen/pelvis: No ileus/no SBO.  Mild left hydronephrosis of uncertain etiology.  Left lung base PNA.  Significant microbiology data: 11/23>> COVID PCR: Positive 11/23>> influenza PCR: Negative  Procedures: None  Consults: None  Subjective: On room air Apparently had some loose stools yesterday but none today.  Objective: Vitals: Blood pressure (!) 171/94, pulse 81, temperature 98.4 F (36.9 C), temperature source Oral, resp. rate 20, height '5\' 1"'$  (1.549 m), weight 69.9 kg, SpO2 96 %.   Exam: Gen Exam:Alert awake-not in any distress HEENT:atraumatic, normocephalic Chest: B/L clear to auscultation anteriorly CVS:S1S2  regular Abdomen:soft non tender, non distended Extremities:+ edema Neurology: Non focal Skin: no rash  Pertinent Labs/Radiology:    Latest Ref Rng & Units 07/05/2022    6:04 AM 07/04/2022    2:16 AM 07/03/2022   12:51 PM  CBC  WBC 4.0 - 10.5 K/uL 7.9  7.6  3.0   Hemoglobin 12.0 - 15.0 g/dL 10.2  10.0  9.2   Hematocrit 36.0 - 46.0 % 30.7  30.6  27.4   Platelets 150 - 400 K/uL 215  220  193     Lab Results  Component Value Date   NA 134 (L) 07/08/2022   K 3.1 (L) 07/08/2022   CL 96 (L) 07/08/2022   CO2 23 07/08/2022      Assessment/Plan: Acute hypoxic respiratory failure likely due to aspiration pneumonia Improved  On room air  Plan is to complete Rocephin/Flagyl x 5 days. SLP following-now on dysphagia 3 diet.  COVID-19 infection Completed 5 days of Paxlovid.   CRP downtrending  Since clinically improved-on room air-no indication for steroids.  Vomiting Resolved-unclear etiology-likely from underlying COVID infection. CT abdomen/pelvis without SBO/ileus.  Minimally elevated D-dimer Due to COVID-19 infection Low suspicion for VTE given improving hypoxemia Check Dopplers lower extremity On prophylactic Lovenox  AKI Mild Likely hemodynamically mediated Resolved  Hypokalemia/hypomagnesemia Replete/recheck.  Chronic HFpEF Volume status stable Has some puffy legs/mild edema at baseline Continue furosemide  HTN BP on the higher side today-Will increase beta-blocker dosage. Continue Lasix Per my discussion with pharmacist several days ago-patient no longer on valsartan.  Normocytic anemia Chronic issue Follows with Dr. Lorenso Courier as an outpatient Follow CBC periodically  Remote history of breast cancer s/p mastectomy 1998-followed by 5 years of tamoxifen  Severe debility/deconditioning Due to COVID-19  infection/acute illness PT/OT following-SNF recommended Discussed with daughter Ahmedta today-family agreeable for SNF.  Palliative care See prior  notes  DNR status  If worsens-May need to start hospice discussion with family  However-Seems to have improved significantly overnight Per patient's daughter-patient is being followed by palliative care as an outpatient.  BMI: Estimated body mass index is 29.12 kg/m as calculated from the following:   Height as of this encounter: '5\' 1"'$  (1.549 m).   Weight as of this encounter: 69.9 kg.   Code status:   Code Status: DNR   DVT Prophylaxis: enoxaparin (LOVENOX) injection 40 mg Start: 07/03/22 1700    Family Communication:  Daughter-Ahmedta Via-[phone]-updated over the phone on 11/28-she will update rest of the family members.   Disposition Plan: Status is: Observation The patient will require care spanning > 2 midnights and should be moved to inpatient because: COVID-severe debility/weakness-aspiration event overnight requiring oxygen.   Planned Discharge Destination:Skilled nursing facility-unfortunately will need to remain in the hospital for 10 days of isolation from 11/22 before she can be discharged.   Diet: Diet Order             DIET DYS 3 Room service appropriate? No; Fluid consistency: Thin  Diet effective now                     Antimicrobial agents: Anti-infectives (From admission, onward)    Start     Dose/Rate Route Frequency Ordered Stop   07/04/22 0600  cefTRIAXone (ROCEPHIN) 2 g in sodium chloride 0.9 % 100 mL IVPB        2 g 200 mL/hr over 30 Minutes Intravenous Daily 07/04/22 0428 07/08/22 0654   07/04/22 0600  metroNIDAZOLE (FLAGYL) IVPB 500 mg        500 mg 100 mL/hr over 60 Minutes Intravenous 2 times daily 07/04/22 0428 07/09/22 0759   07/03/22 2200  nirmatrelvir/ritonavir EUA (PAXLOVID) 3 tablet  Status:  Discontinued        3 tablet Oral 2 times daily 07/03/22 1656 07/03/22 2016   07/03/22 2200  nirmatrelvir/ritonavir EUA (renal dosing) (PAXLOVID) 2 tablet        2 tablet Oral 2 times daily 07/03/22 2016 07/06/22 2359         MEDICATIONS: Scheduled Meds:  enoxaparin (LOVENOX) injection  40 mg Subcutaneous Q24H   furosemide  40 mg Oral Daily   metoprolol succinate  25 mg Oral Daily   mouth rinse  15 mL Mouth Rinse 4 times per day   pantoprazole  40 mg Oral Daily   potassium chloride  40 mEq Oral Q4H   sodium chloride flush  3 mL Intravenous Q12H   Continuous Infusions:  metronidazole 500 mg (07/08/22 0803)   PRN Meds:.acetaminophen **OR** acetaminophen, hydrALAZINE, loperamide, ondansetron (ZOFRAN) IV, mouth rinse, polyethylene glycol   I have personally reviewed following labs and imaging studies  LABORATORY DATA: CBC: Recent Labs  Lab 07/03/22 1251 07/04/22 0216 07/05/22 0604  WBC 3.0* 7.6 7.9  NEUTROABS 1.8  --   --   HGB 9.2* 10.0* 10.2*  HCT 27.4* 30.6* 30.7*  MCV 82.0 81.8 81.6  PLT 193 220 215     Basic Metabolic Panel: Recent Labs  Lab 07/04/22 0216 07/05/22 0239 07/06/22 0237 07/07/22 0250 07/08/22 0541  NA 132* 134* 134* 133* 134*  K 3.9 3.5 4.0 3.1* 3.1*  CL 92* 92* 96* 97* 96*  CO2 '26 26 23 24 23  '$ GLUCOSE 104* 126* 104* 87 79  BUN  15 26* 26* 16 10  CREATININE 0.98 1.47* 1.12* 0.75 0.70  CALCIUM 9.2 9.5 8.7* 8.7* 8.6*  MG  --   --  1.6*  --  1.6*     GFR: Estimated Creatinine Clearance: 37.6 mL/min (by C-G formula based on SCr of 0.7 mg/dL).  Liver Function Tests: Recent Labs  Lab 07/03/22 1251 07/04/22 0216 07/05/22 0239  AST '23 26 25  '$ ALT '12 14 14  '$ ALKPHOS 67 68 65  BILITOT 0.8 0.9 0.5  PROT 6.9 7.3 6.9  ALBUMIN 3.0* 3.1* 2.7*    No results for input(s): "LIPASE", "AMYLASE" in the last 168 hours. No results for input(s): "AMMONIA" in the last 168 hours.  Coagulation Profile: No results for input(s): "INR", "PROTIME" in the last 168 hours.  Cardiac Enzymes: No results for input(s): "CKTOTAL", "CKMB", "CKMBINDEX", "TROPONINI" in the last 168 hours.  BNP (last 3 results) No results for input(s): "PROBNP" in the last 8760 hours.  Lipid  Profile: No results for input(s): "CHOL", "HDL", "LDLCALC", "TRIG", "CHOLHDL", "LDLDIRECT" in the last 72 hours.  Thyroid Function Tests: No results for input(s): "TSH", "T4TOTAL", "FREET4", "T3FREE", "THYROIDAB" in the last 72 hours.  Anemia Panel: No results for input(s): "VITAMINB12", "FOLATE", "FERRITIN", "TIBC", "IRON", "RETICCTPCT" in the last 72 hours.   Urine analysis:    Component Value Date/Time   COLORURINE AMBER (A) 07/04/2022 0415   APPEARANCEUR CLOUDY (A) 07/04/2022 0415   LABSPEC 1.015 07/04/2022 0415   PHURINE 5.0 07/04/2022 0415   GLUCOSEU NEGATIVE 07/04/2022 0415   HGBUR SMALL (A) 07/04/2022 0415   BILIRUBINUR NEGATIVE 07/04/2022 0415   KETONESUR 5 (A) 07/04/2022 0415   PROTEINUR 100 (A) 07/04/2022 0415   UROBILINOGEN 0.2 03/11/2019 1835   NITRITE POSITIVE (A) 07/04/2022 0415   LEUKOCYTESUR LARGE (A) 07/04/2022 0415    Sepsis Labs: Lactic Acid, Venous    Component Value Date/Time   LATICACIDVEN 0.9 07/03/2022 1635    MICROBIOLOGY: Recent Results (from the past 240 hour(s))  Resp Panel by RT-PCR (Flu A&B, Covid) Anterior Nasal Swab     Status: Abnormal   Collection Time: 07/03/22  2:11 PM   Specimen: Anterior Nasal Swab  Result Value Ref Range Status   SARS Coronavirus 2 by RT PCR POSITIVE (A) NEGATIVE Final    Comment: (NOTE) SARS-CoV-2 target nucleic acids are DETECTED.  The SARS-CoV-2 RNA is generally detectable in upper respiratory specimens during the acute phase of infection. Positive results are indicative of the presence of the identified virus, but do not rule out bacterial infection or co-infection with other pathogens not detected by the test. Clinical correlation with patient history and other diagnostic information is necessary to determine patient infection status. The expected result is Negative.  Fact Sheet for Patients: EntrepreneurPulse.com.au  Fact Sheet for Healthcare  Providers: IncredibleEmployment.be  This test is not yet approved or cleared by the [place] FDA and  has been authorized for detection and/or diagnosis of SARS-CoV-2 by FDA under an Emergency Use Authorization (EUA).  This EUA will remain in effect (meaning this test can be used) for the duration of  the COVID-19 declaration under Section 564(b)(1) of the A ct, 21 U.S.C. section 360bbb-3(b)(1), unless the authorization is terminated or revoked sooner.     Influenza A by PCR NEGATIVE NEGATIVE Final   Influenza B by PCR NEGATIVE NEGATIVE Final    Comment: (NOTE) The Xpert Xpress SARS-CoV-2/FLU/RSV plus assay is intended as an aid in the diagnosis of influenza from Nasopharyngeal swab specimens and should not  be used as a sole basis for treatment. Nasal washings and aspirates are unacceptable for Xpert Xpress SARS-CoV-2/FLU/RSV testing.  Fact Sheet for Patients: EntrepreneurPulse.com.au  Fact Sheet for Healthcare Providers: IncredibleEmployment.be  This test is not yet approved or cleared by the [place] FDA and has been authorized for detection and/or diagnosis of SARS-CoV-2 by FDA under an Emergency Use Authorization (EUA). This EUA will remain in effect (meaning this test can be used) for the duration of the COVID-19 declaration under Section 564(b)(1) of the Act, 21 U.S.C. section 360bbb-3(b)(1), unless the authorization is terminated or revoked.  Performed at [facility], [place] [address] [place], [place] [address]     RADIOLOGY STUDIES/RESULTS: CT ABDOMEN PELVIS WO CONTRAST  Result Date: 07/06/2022 CLINICAL DATA:  Suspected bowel obstruction EXAM: CT ABDOMEN AND PELVIS WITHOUT CONTRAST TECHNIQUE: Multidetector CT imaging of the abdomen and pelvis was performed following the standard protocol without IV contrast. RADIATION DOSE REDUCTION: This exam was performed according to the departmental  dose-optimization program which includes automated exposure control, adjustment of the mA and/or kV according to patient size and/or use of iterative reconstruction technique. COMPARISON:  CT 12/22/2007 FINDINGS: Lower chest: No pleural or pericardial effusion. Scattered aortic calcifications. Patchy airspace infiltrates in the posteromedial left lung base. Hepatobiliary: At least 2 partially calcified gallstones of measure up to 1.8 cm in the physiologically distended gallbladder. No focal liver lesion or biliary ductal dilatation evident. Pancreas: Unremarkable. No pancreatic ductal dilatation or surrounding inflammatory changes. Spleen: Normal in size without focal abnormality. Adrenals/Urinary Tract: No nephrolithiasis. There is mild left hydronephrosis and ureterectasis. Urinary bladder is incompletely distended, with a small diverticulum from the dome. Stomach/Bowel: Stomach is partially distended, unremarkable. There is a single loop of small bowel dilated to 5 cm in the right mid abdomen, and other scattered mildly distended loops, but good distal passage of oral contrast material to the colon. The colon is nondilated, unremarkable. Vascular/Lymphatic: Moderate aortoiliac calcified atheromatous plaque without aneurysm. No definite abdominal or pelvic adenopathy. Reproductive: Uterus and bilateral adnexa are unremarkable. Other: Multiple pelvic phleboliths.  No ascites.  No free air. Musculoskeletal: Small amount of subcutaneous gas in the right mid body wall presumably related to subcutaneous injections. 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 Lumbar levoscoliosis with multilevel spondylitic change. Grade 1/2 anterolisthesis L3-4 with spinal stenosis. IMPRESSION: 1. Single dilated loop of small bowel in the right mid abdomen, but good distal passage of oral contrast material to the colon. 2. Mild left hydronephrosis and ureterectasis, of uncertain etiology. No nephrolithiasis. 3. Cholelithiasis. 4. Patchy  airspace infiltrates in the posteromedial left lung base, possibly pneumonia. 5. Lumbar levoscoliosis, spondylitic change, and spinal stenosis L3-4. Aortic Atherosclerosis (ICD10-I70.0). Electronically Signed   By: Lucrezia Europe M.D.   On: 07/06/2022 19:32     LOS: 4 days   Oren Binet, MD  [facility]    To contact the attending provider between 7A-7P or the covering provider during after hours 7P-7A, please log into the web site [email or web address] and access using universal [place] password for that web site. If you do not have the password, please call the hospital operator.  07/08/2022, 11:33 AM

## 2022-07-08 NOTE — Plan of Care (Signed)

## 2022-07-09 DIAGNOSIS — U071 COVID-19: Secondary | ICD-10-CM | POA: Diagnosis not present

## 2022-07-09 DIAGNOSIS — N179 Acute kidney failure, unspecified: Secondary | ICD-10-CM | POA: Diagnosis not present

## 2022-07-09 DIAGNOSIS — I10 Essential (primary) hypertension: Secondary | ICD-10-CM | POA: Diagnosis not present

## 2022-07-09 DIAGNOSIS — I5032 Chronic diastolic (congestive) heart failure: Secondary | ICD-10-CM | POA: Diagnosis not present

## 2022-07-09 LAB — BASIC METABOLIC PANEL
Anion gap: 9 (ref 5–15)
BUN: 9 mg/dL (ref 8–23)
CO2: 29 mmol/L (ref 22–32)
Calcium: 9.1 mg/dL (ref 8.9–10.3)
Chloride: 98 mmol/L (ref 98–111)
Creatinine, Ser: 0.61 mg/dL (ref 0.44–1.00)
GFR, Estimated: >60 mL/min (ref >60)
Glucose, Bld: 125 mg/dL — ABNORMAL HIGH (ref 70–99)
Potassium: 3.8 mmol/L (ref 3.5–5.1)
Sodium: 136 mmol/L (ref 135–145)

## 2022-07-09 LAB — MAGNESIUM: Magnesium: 1.9 mg/dL (ref 1.7–2.4)

## 2022-07-09 MED ORDER — POTASSIUM CHLORIDE CRYS ER 20 MEQ PO TBCR
20.0000 meq | EXTENDED_RELEASE_TABLET | Freq: Once | ORAL | Status: AC
  Administered 2022-07-09: 20 meq via ORAL
  Filled 2022-07-09: qty 1

## 2022-07-09 MED ORDER — GUAIFENESIN-DM 100-10 MG/5ML PO SYRP
5.0000 mL | ORAL_SOLUTION | ORAL | Status: DC | PRN
  Administered 2022-07-09 – 2022-07-15 (×8): 5 mL via ORAL
  Filled 2022-07-09 (×8): qty 5

## 2022-07-09 MED ORDER — MAGNESIUM SULFATE IN D5W 1-5 GM/100ML-% IV SOLN
1.0000 g | Freq: Once | INTRAVENOUS | Status: AC
  Administered 2022-07-09: 1 g via INTRAVENOUS
  Filled 2022-07-09: qty 100

## 2022-07-09 NOTE — Progress Notes (Signed)
PROGRESS NOTE        PATIENT DETAILS Name: Cassandra Wu Age: 88 y.o. Sex: female Date of Birth: [birth year] Admit Date: 07/03/2022 Admitting Physician Marcelyn Bruins, MD BZJ:[id number], Magdalene River, FNP  Brief Summary: Patient is a 88 y.o.  female with history of HTN, chronic HFpEF, history of breast cancer (s/p mastectomy in 1998)-who developed COVID-19 infection a few days prior to this hospitalization-presented to the ED with weakness-she was subsequently admitted to the hospitalist service.  Post discharge-patient had a episode of coughing spells following which she vomited and aspirated.  See below for further details.  Significant events: 11/22>>covid +ve at home, started on Paxlovid 11/23>> to ED with generalized weakness/COVID infection-admit to [facility] 11/24>> vomiting after coughing spell-food particles aspirated from airway-suspicion for aspiration pneumonia-started on Unasyn. 11/25>> several vomiting episodes overnight-abdominal x-ray with possible ileus.  Significant studies: 01/02>> echo: EF 75-10%, grade 1 diastolic dysfunction. 11/23>> CXR: No PNA 11/24>> CXR: Chronic interstitial coarsening but no obvious infiltrates. 11/25>> CXR: No obvious PNA-some chronic interstitial changes. 11/26>> CT abdomen/pelvis: No ileus/no SBO.  Mild left hydronephrosis of uncertain etiology.  Left lung base PNA.  Significant microbiology data: 11/23>> COVID PCR: Positive 11/23>> influenza PCR: Negative  Procedures: None  Consults: None  Subjective: Lying comfortably in bed apparently and did not want to get out of bed with therapy yesterday.  Only did exercise in bed.  Objective: Vitals: Blood pressure (!) 143/96, pulse 87, temperature 98.4 F (36.9 C), temperature source Oral, resp. rate 18, height '5\' 1"'$  (1.549 m), weight 69.9 kg, SpO2 99 %.   Exam: Gen Exam:Alert awake-not in any distress HEENT:atraumatic, normocephalic Chest: B/L clear  to auscultation anteriorly CVS:S1S2 regular Abdomen:soft non tender, non distended Extremities:no edema Neurology: Non focal with generalized weakness. Skin: no rash  Pertinent Labs/Radiology:    Latest Ref Rng & Units 07/05/2022    6:04 AM 07/04/2022    2:16 AM 07/03/2022   12:51 PM  CBC  WBC 4.0 - 10.5 K/uL 7.9  7.6  3.0   Hemoglobin 12.0 - 15.0 g/dL 10.2  10.0  9.2   Hematocrit 36.0 - 46.0 % 30.7  30.6  27.4   Platelets 150 - 400 K/uL 215  220  193     Lab Results  Component Value Date   NA 134 (L) 07/08/2022   K 3.1 (L) 07/08/2022   CL 96 (L) 07/08/2022   CO2 23 07/08/2022      Assessment/Plan: Acute hypoxic respiratory failure likely due to aspiration pneumonia Improved  On room air this morning Has completed Rocephin/Flagyl x 5 days SLP following-now on dysphagia 3 diet-which she seems to be tolerating well.  COVID-19 infection Completed 5 days of Paxlovid.   CRP downtrending  Since clinically improved-on room air-no indication for steroids.  Vomiting Resolved-unclear etiology-likely from underlying COVID infection. CT abdomen/pelvis without SBO/ileus.  Minimally elevated D-dimer Due to COVID-19 infection Low suspicion for VTE given improving hypoxemia Given stability-doubt any further workup is required-not hypoxic. For numerous variety of reasons-Doppler has not been completed for the past several days-felt to be of low yield at this point-will discontinue.  AKI Mild Likely hemodynamically mediated Resolved  Hypokalemia/hypomagnesemia Await morning labs.  Chronic HFpEF Volume status stable Has some puffy legs/mild edema at baseline Continue furosemide  HTN BP stable Beta-blocker dosage adjusted on 11/28  Continue Lasix Per my discussion  with pharmacist several days ago-patient no longer on valsartan.  Normocytic anemia Chronic issue Follows with Dr. Lorenso Courier as an outpatient Follow CBC periodically  Remote history of breast cancer s/p  mastectomy 1998-followed by 5 years of tamoxifen  Severe debility/deconditioning Due to COVID-19 infection/acute illness PT/OT following-SNF recommended Have counseled patient extensively today regarding importance of getting out of bed to chair (refused yesterday per therapy note) Discussed with daughter Ahmedta today-family agreeable for SNF.  Palliative care See prior notes  DNR status  If worsens-May need to start hospice discussion with family  However-Seems to have improved significantly overnight Per patient's daughter-patient is being followed by palliative care as an outpatient.  BMI: Estimated body mass index is 29.12 kg/m as calculated from the following:   Height as of this encounter: '5\' 1"'$  (1.549 m).   Weight as of this encounter: 69.9 kg.   Code status:   Code Status: DNR   DVT Prophylaxis: enoxaparin (LOVENOX) injection 40 mg Start: 07/03/22 1700    Family Communication:  Daughter-Ahmedta Via-[phone]-left voicemail on 11/29   Disposition Plan: Status is: Observation The patient will require care spanning > 2 midnights and should be moved to inpatient because: COVID-severe debility/weakness-aspiration event overnight requiring oxygen.   Planned Discharge Destination:Skilled nursing facility-unfortunately will need to remain in the hospital for 10 days of isolation from 11/22 before she can be discharged.   Diet: Diet Order             DIET DYS 3 Room service appropriate? No; Fluid consistency: Thin  Diet effective now                     Antimicrobial agents: Anti-infectives (From admission, onward)    Start     Dose/Rate Route Frequency Ordered Stop   07/04/22 0600  cefTRIAXone (ROCEPHIN) 2 g in sodium chloride 0.9 % 100 mL IVPB        2 g 200 mL/hr over 30 Minutes Intravenous Daily 07/04/22 0428 07/08/22 0654   07/04/22 0600  metroNIDAZOLE (FLAGYL) IVPB 500 mg        500 mg 100 mL/hr over 60 Minutes Intravenous 2 times daily 07/04/22  0428 07/08/22 2217   07/03/22 2200  nirmatrelvir/ritonavir EUA (PAXLOVID) 3 tablet  Status:  Discontinued        3 tablet Oral 2 times daily 07/03/22 1656 07/03/22 2016   07/03/22 2200  nirmatrelvir/ritonavir EUA (renal dosing) (PAXLOVID) 2 tablet        2 tablet Oral 2 times daily 07/03/22 2016 07/06/22 2359        MEDICATIONS: Scheduled Meds:  enoxaparin (LOVENOX) injection  40 mg Subcutaneous Q24H   feeding supplement  237 mL Oral BID BM   furosemide  40 mg Oral Daily   metoprolol succinate  50 mg Oral Daily   mouth rinse  15 mL Mouth Rinse 4 times per day   pantoprazole  40 mg Oral Daily   sodium chloride flush  3 mL Intravenous Q12H   Continuous Infusions:   PRN Meds:.acetaminophen **OR** acetaminophen, hydrALAZINE, loperamide, ondansetron (ZOFRAN) IV, mouth rinse, polyethylene glycol   I have personally reviewed following labs and imaging studies  LABORATORY DATA: CBC: Recent Labs  Lab 07/03/22 1251 07/04/22 0216 07/05/22 0604  WBC 3.0* 7.6 7.9  NEUTROABS 1.8  --   --   HGB 9.2* 10.0* 10.2*  HCT 27.4* 30.6* 30.7*  MCV 82.0 81.8 81.6  PLT 193 220 215     Basic Metabolic Panel: Recent Labs  Lab 07/04/22 0216 07/05/22 0239 07/06/22 0237 07/07/22 0250 07/08/22 0541  NA 132* 134* 134* 133* 134*  K 3.9 3.5 4.0 3.1* 3.1*  CL 92* 92* 96* 97* 96*  CO2 '26 26 23 24 23  '$ GLUCOSE 104* 126* 104* 87 79  BUN 15 26* 26* 16 10  CREATININE 0.98 1.47* 1.12* 0.75 0.70  CALCIUM 9.2 9.5 8.7* 8.7* 8.6*  MG  --   --  1.6*  --  1.6*     GFR: Estimated Creatinine Clearance: 37.6 mL/min (by C-G formula based on SCr of 0.7 mg/dL).  Liver Function Tests: Recent Labs  Lab 07/03/22 1251 07/04/22 0216 07/05/22 0239  AST '23 26 25  '$ ALT '12 14 14  '$ ALKPHOS 67 68 65  BILITOT 0.8 0.9 0.5  PROT 6.9 7.3 6.9  ALBUMIN 3.0* 3.1* 2.7*    No results for input(s): "LIPASE", "AMYLASE" in the last 168 hours. No results for input(s): "AMMONIA" in the last 168 hours.  Coagulation  Profile: No results for input(s): "INR", "PROTIME" in the last 168 hours.  Cardiac Enzymes: No results for input(s): "CKTOTAL", "CKMB", "CKMBINDEX", "TROPONINI" in the last 168 hours.  BNP (last 3 results) No results for input(s): "PROBNP" in the last 8760 hours.  Lipid Profile: No results for input(s): "CHOL", "HDL", "LDLCALC", "TRIG", "CHOLHDL", "LDLDIRECT" in the last 72 hours.  Thyroid Function Tests: No results for input(s): "TSH", "T4TOTAL", "FREET4", "T3FREE", "THYROIDAB" in the last 72 hours.  Anemia Panel: No results for input(s): "VITAMINB12", "FOLATE", "FERRITIN", "TIBC", "IRON", "RETICCTPCT" in the last 72 hours.   Urine analysis:    Component Value Date/Time   COLORURINE AMBER (A) 07/04/2022 0415   APPEARANCEUR CLOUDY (A) 07/04/2022 0415   LABSPEC 1.015 07/04/2022 0415   PHURINE 5.0 07/04/2022 0415   GLUCOSEU NEGATIVE 07/04/2022 0415   HGBUR SMALL (A) 07/04/2022 0415   BILIRUBINUR NEGATIVE 07/04/2022 0415   KETONESUR 5 (A) 07/04/2022 0415   PROTEINUR 100 (A) 07/04/2022 0415   UROBILINOGEN 0.2 03/11/2019 1835   NITRITE POSITIVE (A) 07/04/2022 0415   LEUKOCYTESUR LARGE (A) 07/04/2022 0415    Sepsis Labs: Lactic Acid, Venous    Component Value Date/Time   LATICACIDVEN 0.9 07/03/2022 1635    MICROBIOLOGY: Recent Results (from the past 240 hour(s))  Resp Panel by RT-PCR (Flu A&B, Covid) Anterior Nasal Swab     Status: Abnormal   Collection Time: 07/03/22  2:11 PM   Specimen: Anterior Nasal Swab  Result Value Ref Range Status   SARS Coronavirus 2 by RT PCR POSITIVE (A) NEGATIVE Final    Comment: (NOTE) SARS-CoV-2 target nucleic acids are DETECTED.  The SARS-CoV-2 RNA is generally detectable in upper respiratory specimens during the acute phase of infection. Positive results are indicative of the presence of the identified virus, but do not rule out bacterial infection or co-infection with other pathogens not detected by the test. Clinical correlation  with patient history and other diagnostic information is necessary to determine patient infection status. The expected result is Negative.  Fact Sheet for Patients: EntrepreneurPulse.com.au  Fact Sheet for Healthcare Providers: IncredibleEmployment.be  This test is not yet approved or cleared by the [place] FDA and  has been authorized for detection and/or diagnosis of SARS-CoV-2 by FDA under an Emergency Use Authorization (EUA).  This EUA will remain in effect (meaning this test can be used) for the duration of  the COVID-19 declaration under Section 564(b)(1) of the A ct, 21 U.S.C. section 360bbb-3(b)(1), unless the authorization is terminated or revoked sooner.  Influenza A by PCR NEGATIVE NEGATIVE Final   Influenza B by PCR NEGATIVE NEGATIVE Final    Comment: (NOTE) The Xpert Xpress SARS-CoV-2/FLU/RSV plus assay is intended as an aid in the diagnosis of influenza from Nasopharyngeal swab specimens and should not be used as a sole basis for treatment. Nasal washings and aspirates are unacceptable for Xpert Xpress SARS-CoV-2/FLU/RSV testing.  Fact Sheet for Patients: EntrepreneurPulse.com.au  Fact Sheet for Healthcare Providers: IncredibleEmployment.be  This test is not yet approved or cleared by the [place] FDA and has been authorized for detection and/or diagnosis of SARS-CoV-2 by FDA under an Emergency Use Authorization (EUA). This EUA will remain in effect (meaning this test can be used) for the duration of the COVID-19 declaration under Section 564(b)(1) of the Act, 21 U.S.C. section 360bbb-3(b)(1), unless the authorization is terminated or revoked.  Performed at [facility], [place] [address] [place], [place] [address]     RADIOLOGY STUDIES/RESULTS: No results found.   LOS: 5 days   Oren Binet, MD  [facility]    To contact the attending provider  between 7A-7P or the covering provider during after hours 7P-7A, please log into the web site [email or web address] and access using universal [place] password for that web site. If you do not have the password, please call the hospital operator.  07/09/2022, 10:08 AM

## 2022-07-09 NOTE — Progress Notes (Signed)
OT Cancellation Note  Patient Details Name: Eliannah Hinde MRN: [id number] DOB: [birth year]   Cancelled Treatment:    Reason Eval/Treat Not Completed: Other (comment).  Patient just gotten up to recliner, and preparing to eat.  Will continue efforts.   Clatie Kessen D Aasiyah Auerbach 07/09/2022, 4:43 PM 07/09/2022  RP, OTR/L  Acute Rehabilitation Services  Office:  [phone]

## 2022-07-09 NOTE — TOC Progression Note (Signed)
Transition of Care [facility]) - Progression Note    Patient Details  Name: Cassandra Wu MRN: [id number] Date of Birth: 1924-11-10  [facility]) CM/SW [place], LCSW Phone Number: 07/09/2022, 2:25 PM  Clinical Narrative:    12pm-[facility] checking to see if they can accept patient prior to 10 days. CSW initiated insurance authorization process, Ref# [id number]. CSW updated daughter.   2:36pm-Per [facility], they cannot accept patient until day 3, which is 12/4.     Expected Discharge Plan: [place] Barriers to Discharge: Continued Medical Work up, Ship broker, SNF Pending bed offer  Expected Discharge Plan and Services Expected Discharge Plan: [place] In-house Referral: Clinical Social Work   Post Acute Care Choice: [place] Living arrangements for the past 2 months: Single Family Home                                       Social Determinants of Health (SDOH) Interventions    Readmission Risk Interventions     No data to display

## 2022-07-10 DIAGNOSIS — N179 Acute kidney failure, unspecified: Secondary | ICD-10-CM | POA: Diagnosis not present

## 2022-07-10 DIAGNOSIS — U071 COVID-19: Secondary | ICD-10-CM | POA: Diagnosis not present

## 2022-07-10 DIAGNOSIS — I5032 Chronic diastolic (congestive) heart failure: Secondary | ICD-10-CM | POA: Diagnosis not present

## 2022-07-10 DIAGNOSIS — I10 Essential (primary) hypertension: Secondary | ICD-10-CM | POA: Diagnosis not present

## 2022-07-10 LAB — CREATININE, SERUM
Creatinine, Ser: 0.65 mg/dL (ref 0.44–1.00)
GFR, Estimated: >60 mL/min (ref >60)

## 2022-07-10 NOTE — Progress Notes (Signed)
Occupational Therapy Treatment Patient Details Name: Cassandra Wu MRN: [id number] DOB: 1924-11-03 Today's Date: 07/10/2022   History of present illness 88 y.o. female with medical history significant of hypertension, TIA, anemia, diastolic CHF, lymphedema, history of mastectomy, history of breast cancer, history of GI bleed presenting 11/23 with COVID and generalized decline.   OT comments  Improved mentation and command following.  Patient is alert and oriented to person, place and time.  Able to sit at the edge of bed with Max A, and near total assist for squat pivot the the recliner.  Patient remains very weak, with poor dynamic balance.  OT will continue to follow, with SNF level rehab to regain independence for eventual return home.     Recommendations for follow up therapy are one component of a multi-disciplinary discharge planning process, led by the attending physician.  Recommendations may be updated based on patient status, additional functional criteria and insurance authorization.    Follow Up Recommendations  Skilled nursing-short term rehab (<3 hours/day)     Assistance Recommended at Discharge Frequent or constant Supervision/Assistance  Patient can return home with the following  Assistance with cooking/housework;A lot of help with walking and/or transfers;A lot of help with bathing/dressing/bathroom   Equipment Recommendations  None recommended by OT    Recommendations for Other Services      Precautions / Restrictions Precautions Precautions: Fall Restrictions Weight Bearing Restrictions: No       Mobility Bed Mobility Overal bed mobility: Needs Assistance Bed Mobility: Supine to Sit     Supine to sit: HOB elevated, Max assist          Transfers Overall transfer level: Needs assistance   Transfers: Bed to chair/wheelchair/BSC Sit to Stand: Max assist, From elevated surface                 Balance Overall balance assessment: Needs  assistance Sitting-balance support: Feet supported Sitting balance-Leahy Scale: Fair                                     ADL either performed or assessed with clinical judgement   ADL   Eating/Feeding: Set up;Bed level   Grooming: Wash/dry hands;Wash/dry face;Set up;Sitting                   Toilet Transfer: Maximal assistance;Squat-pivot                  Extremity/Trunk Assessment Upper Extremity Assessment Upper Extremity Assessment: Generalized weakness RUE Deficits / Details: Per daughter hx of RCT, but able to reach top of her head LUE Deficits / Details: Per daughter hx of RCT, but able to reach top of her head   Lower Extremity Assessment Lower Extremity Assessment: Defer to PT evaluation   Cervical / Trunk Assessment Cervical / Trunk Assessment: Kyphotic    Vision Baseline Vision/History: 1 Wears glasses Patient Visual Report: No change from baseline     Perception Perception Perception: Within Functional Limits   Praxis Praxis Praxis: Intact    Cognition Arousal/Alertness: Awake/alert Behavior During Therapy: Flat affect Overall Cognitive Status: Within Functional Limits for tasks assessed                                 General Comments: improved mentation and command following.  General Comments  VSS on RA    Pertinent Vitals/ Pain       Pain Assessment Pain Assessment: Faces Faces Pain Scale: Hurts a little bit Pain Location: bottom Pain Descriptors / Indicators: Aching Pain Intervention(s): Monitored during session                                                          Frequency  Min 2X/week        Progress Toward Goals  OT Goals(current goals can now be found in the care plan section)  Progress towards OT goals: Progressing toward goals  Acute Rehab OT Goals OT Goal Formulation: With patient Time For Goal Achievement:  07/18/22 Potential to Achieve Goals: [place] Discharge plan remains appropriate    Co-evaluation                 AM-PAC OT "6 Clicks" Daily Activity     Outcome Measure   Help from another person eating meals?: A Little Help from another person taking care of personal grooming?: A Little Help from another person toileting, which includes using toliet, bedpan, or urinal?: Total Help from another person bathing (including washing, rinsing, drying)?: A Lot Help from another person to put on and taking off regular upper body clothing?: A Lot Help from another person to put on and taking off regular lower body clothing?: A Lot 6 Click Score: 13    End of Session Equipment Utilized During Treatment: Gait belt  OT Visit Diagnosis: Unsteadiness on feet (R26.81);Muscle weakness (generalized) (M62.81);Other symptoms and signs involving cognitive function   Activity Tolerance Patient tolerated treatment well   Patient Left in chair;with call bell/phone within reach;with chair alarm set   Nurse Communication Mobility status        Time: 6153-7943 OT Time Calculation (min): 19 min  Charges: OT General Charges $OT Visit: 1 Visit OT Treatments $Therapeutic Activity: 8-22 mins  07/10/2022  RP, OTR/L  Acute Rehabilitation Services  Office:  [phone]   Metta Clines 07/10/2022, 11:16 AM

## 2022-07-10 NOTE — TOC Progression Note (Signed)
Transition of Care [facility]) - Progression Note    Patient Details  Name: Kalya Troeger MRN: [id number] Date of Birth: 1924-09-13  Transition of Care [facility]) CM/SW [place], LCSW Phone Number: 07/10/2022, 3:48 PM  Clinical Narrative:    Insurance requesting updated clinicals. CSW uploaded the OT note from today. Daughter, Doristine Bosworth, updated.    Expected Discharge Plan: [place] Barriers to Discharge: Continued Medical Work up, Ship broker, SNF Pending bed offer  Expected Discharge Plan and Services Expected Discharge Plan: [place] In-house Referral: Clinical Social Work   Post Acute Care Choice: [place] Living arrangements for the past 2 months: Single Family Home                                       Social Determinants of Health (SDOH) Interventions    Readmission Risk Interventions     No data to display

## 2022-07-10 NOTE — Progress Notes (Signed)
PROGRESS NOTE        PATIENT DETAILS Name: Cassandra Wu Age: 88 y.o. Sex: female Date of Birth: 05/07/1925 Admit Date: 07/03/2022 Admitting Physician Marcelyn Bruins, MD [facility]:[id number], Magdalene River, FNP  Brief Summary: Patient is a 88 y.o.  female with history of HTN, chronic HFpEF, history of breast cancer (s/p mastectomy in 1998)-who developed COVID-19 infection a few days prior to this hospitalization-presented to the ED with weakness-she was subsequently admitted to the hospitalist service.  Post discharge-patient had a episode of coughing spells following which she vomited and aspirated.  See below for further details.  Significant events: 11/22>>covid +ve at home, started on Paxlovid 11/23>> to ED with generalized weakness/COVID infection-admit to [facility] 11/24>> vomiting after coughing spell-food particles aspirated from airway-suspicion for aspiration pneumonia-started on Unasyn. 11/25>> several vomiting episodes overnight-abdominal x-ray with possible ileus.  Significant studies: 01/02>> echo: EF 89-38%, grade 1 diastolic dysfunction. 11/23>> CXR: No PNA 11/24>> CXR: Chronic interstitial coarsening but no obvious infiltrates. 11/25>> CXR: No obvious PNA-some chronic interstitial changes. 11/26>> CT abdomen/pelvis: No ileus/no SBO.  Mild left hydronephrosis of uncertain etiology.  Left lung base PNA.  Significant microbiology data: 11/23>> COVID PCR: Positive 11/23>> influenza PCR: Negative  Procedures: None  Consults: None  Subjective: No major issues overnight On room air  Objective: Vitals: Blood pressure (!) 152/80, pulse 78, temperature 97.8 F (36.6 C), temperature source Oral, resp. rate (!) 22, height '5\' 1"'$  (1.549 m), weight 69.9 kg, SpO2 95 %.   Exam: Gen Exam:Alert awake-not in any distress HEENT:atraumatic, normocephalic Chest: B/L clear to auscultation anteriorly CVS:S1S2 regular Abdomen:soft non tender, non  distended Extremities:no edema Neurology: Non focal-severe generalized weakness. Skin: no rash  Pertinent Labs/Radiology:    Latest Ref Rng & Units 07/05/2022    6:04 AM 07/04/2022    2:16 AM 07/03/2022   12:51 PM  CBC  WBC 4.0 - 10.5 K/uL 7.9  7.6  3.0   Hemoglobin 12.0 - 15.0 g/dL 10.2  10.0  9.2   Hematocrit 36.0 - 46.0 % 30.7  30.6  27.4   Platelets 150 - 400 K/uL 215  220  193     Lab Results  Component Value Date   NA 136 07/09/2022   K 3.8 07/09/2022   CL 98 07/09/2022   CO2 29 07/09/2022      Assessment/Plan: Acute hypoxic respiratory failure likely due to aspiration pneumonia Significantly improved-hypoxia resolved-on room air  Has completed Rocephin/Flagyl x 5 days  Tolerating dysphagia 3 diet-SLP following.   COVID-19 infection Completed 5 days of Paxlovid.   CRP downtrending  Since clinically improved-on room air-no indication for steroids.  Vomiting Resolved-unclear etiology-likely from underlying COVID infection. CT abdomen/pelvis without SBO/ileus.  Minimally elevated D-dimer Due to COVID-19 infection Low suspicion for VTE given improving hypoxemia Given stability-doubt any further workup is required-not hypoxic. For numerous variety of reasons-Doppler has not been completed for the past several days-felt to be of low yield at this point-will discontinue. Continue prophylactic Lovenox  AKI Mild Likely hemodynamically mediated Resolved  Hypokalemia/hypomagnesemia Has resolved-follow electrolytes periodically.  Chronic HFpEF Volume status stable Has some puffy legs/mild edema at baseline Continue furosemide  HTN BP stable Continue beta-blocker/furosemide.   Per my discussion with pharmacist several days ago-patient no longer on valsartan.  Normocytic anemia Chronic issue Follows with Dr. Lorenso Courier as an outpatient Follow CBC periodically  Remote history of breast cancer s/p mastectomy 1998-followed by 5 years of tamoxifen  Severe  debility/deconditioning Due to COVID-19 infection/acute illness PT/OT following-SNF recommended Continue to counsel extensively regarding working with therapy/mobility-patient has been somewhat resistant.  Palliative care See prior notes  DNR status  If worsens-May need to start hospice discussion with family  However-Seems to have improved significantly overnight Per patient's daughter-patient is being followed by palliative care as an outpatient.  BMI: Estimated body mass index is 29.12 kg/m as calculated from the following:   Height as of this encounter: '5\' 1"'$  (1.549 m).   Weight as of this encounter: 69.9 kg.   Code status:   Code Status: DNR   DVT Prophylaxis: enoxaparin (LOVENOX) injection 40 mg Start: 07/03/22 1700    Family Communication:  Daughter-Ahmedta Via-[phone]-left voicemail on 11/29   Disposition Plan: Status is: Observation The patient will require care spanning > 2 midnights and should be moved to inpatient because: COVID-severe debility/weakness-aspiration event overnight requiring oxygen.   Planned Discharge Destination:Skilled nursing facility-unfortunately will need to remain in the hospital for 10 days of isolation from 11/22 before she can be discharged.   Diet: Diet Order             DIET DYS 3 Room service appropriate? No; Fluid consistency: Thin  Diet effective now                     Antimicrobial agents: Anti-infectives (From admission, onward)    Start     Dose/Rate Route Frequency Ordered Stop   07/04/22 0600  cefTRIAXone (ROCEPHIN) 2 g in sodium chloride 0.9 % 100 mL IVPB        2 g 200 mL/hr over 30 Minutes Intravenous Daily 07/04/22 0428 07/09/22 1055   07/04/22 0600  metroNIDAZOLE (FLAGYL) IVPB 500 mg        500 mg 100 mL/hr over 60 Minutes Intravenous 2 times daily 07/04/22 0428 07/09/22 1057   07/03/22 2200  nirmatrelvir/ritonavir EUA (PAXLOVID) 3 tablet  Status:  Discontinued        3 tablet Oral 2 times daily  07/03/22 1656 07/03/22 2016   07/03/22 2200  nirmatrelvir/ritonavir EUA (renal dosing) (PAXLOVID) 2 tablet        2 tablet Oral 2 times daily 07/03/22 2016 07/06/22 2359        MEDICATIONS: Scheduled Meds:  enoxaparin (LOVENOX) injection  40 mg Subcutaneous Q24H   feeding supplement  237 mL Oral BID BM   furosemide  40 mg Oral Daily   metoprolol succinate  50 mg Oral Daily   mouth rinse  15 mL Mouth Rinse 4 times per day   pantoprazole  40 mg Oral Daily   sodium chloride flush  3 mL Intravenous Q12H   Continuous Infusions:   PRN Meds:.acetaminophen **OR** acetaminophen, guaiFENesin-dextromethorphan, hydrALAZINE, loperamide, ondansetron (ZOFRAN) IV, mouth rinse, polyethylene glycol   I have personally reviewed following labs and imaging studies  LABORATORY DATA: CBC: Recent Labs  Lab 07/03/22 1251 07/04/22 0216 07/05/22 0604  WBC 3.0* 7.6 7.9  NEUTROABS 1.8  --   --   HGB 9.2* 10.0* 10.2*  HCT 27.4* 30.6* 30.7*  MCV 82.0 81.8 81.6  PLT 193 220 215     Basic Metabolic Panel: Recent Labs  Lab 07/05/22 0239 07/06/22 0237 07/07/22 0250 07/08/22 0541 07/09/22 0806 07/10/22 0657  NA 134* 134* 133* 134* 136  --   K 3.5 4.0 3.1* 3.1* 3.8  --   CL 92* 96* 97*  96* 98  --   CO2 '26 23 24 23 29  '$ --   GLUCOSE 126* 104* 87 79 125*  --   BUN 26* 26* '16 10 9  '$ --   CREATININE 1.47* 1.12* 0.75 0.70 0.61 0.65  CALCIUM 9.5 8.7* 8.7* 8.6* 9.1  --   MG  --  1.6*  --  1.6* 1.9  --      GFR: Estimated Creatinine Clearance: 37.6 mL/min (by C-G formula based on SCr of 0.65 mg/dL).  Liver Function Tests: Recent Labs  Lab 07/03/22 1251 07/04/22 0216 07/05/22 0239  AST '23 26 25  '$ ALT '12 14 14  '$ ALKPHOS 67 68 65  BILITOT 0.8 0.9 0.5  PROT 6.9 7.3 6.9  ALBUMIN 3.0* 3.1* 2.7*    No results for input(s): "LIPASE", "AMYLASE" in the last 168 hours. No results for input(s): "AMMONIA" in the last 168 hours.  Coagulation Profile: No results for input(s): "INR", "PROTIME" in  the last 168 hours.  Cardiac Enzymes: No results for input(s): "CKTOTAL", "CKMB", "CKMBINDEX", "TROPONINI" in the last 168 hours.  BNP (last 3 results) No results for input(s): "PROBNP" in the last 8760 hours.  Lipid Profile: No results for input(s): "CHOL", "HDL", "LDLCALC", "TRIG", "CHOLHDL", "LDLDIRECT" in the last 72 hours.  Thyroid Function Tests: No results for input(s): "TSH", "T4TOTAL", "FREET4", "T3FREE", "THYROIDAB" in the last 72 hours.  Anemia Panel: No results for input(s): "VITAMINB12", "FOLATE", "FERRITIN", "TIBC", "IRON", "RETICCTPCT" in the last 72 hours.   Urine analysis:    Component Value Date/Time   COLORURINE AMBER (A) 07/04/2022 0415   APPEARANCEUR CLOUDY (A) 07/04/2022 0415   LABSPEC 1.015 07/04/2022 0415   PHURINE 5.0 07/04/2022 0415   GLUCOSEU NEGATIVE 07/04/2022 0415   HGBUR SMALL (A) 07/04/2022 0415   BILIRUBINUR NEGATIVE 07/04/2022 0415   KETONESUR 5 (A) 07/04/2022 0415   PROTEINUR 100 (A) 07/04/2022 0415   UROBILINOGEN 0.2 03/11/2019 1835   NITRITE POSITIVE (A) 07/04/2022 0415   LEUKOCYTESUR LARGE (A) 07/04/2022 0415    Sepsis Labs: Lactic Acid, Venous    Component Value Date/Time   LATICACIDVEN 0.9 07/03/2022 1635    MICROBIOLOGY: Recent Results (from the past 240 hour(s))  Resp Panel by RT-PCR (Flu A&B, Covid) Anterior Nasal Swab     Status: Abnormal   Collection Time: 07/03/22  2:11 PM   Specimen: Anterior Nasal Swab  Result Value Ref Range Status   SARS Coronavirus 2 by RT PCR POSITIVE (A) NEGATIVE Final    Comment: (NOTE) SARS-CoV-2 target nucleic acids are DETECTED.  The SARS-CoV-2 RNA is generally detectable in upper respiratory specimens during the acute phase of infection. Positive results are indicative of the presence of the identified virus, but do not rule out bacterial infection or co-infection with other pathogens not detected by the test. Clinical correlation with patient history and other diagnostic information is  necessary to determine patient infection status. The expected result is Negative.  Fact Sheet for Patients: EntrepreneurPulse.com.au  Fact Sheet for Healthcare Providers: IncredibleEmployment.be  This test is not yet approved or cleared by the [place] FDA and  has been authorized for detection and/or diagnosis of SARS-CoV-2 by FDA under an Emergency Use Authorization (EUA).  This EUA will remain in effect (meaning this test can be used) for the duration of  the COVID-19 declaration under Section 564(b)(1) of the A ct, 21 U.S.C. section 360bbb-3(b)(1), unless the authorization is terminated or revoked sooner.     Influenza A by PCR NEGATIVE NEGATIVE Final  Influenza B by PCR NEGATIVE NEGATIVE Final    Comment: (NOTE) The Xpert Xpress SARS-CoV-2/FLU/RSV plus assay is intended as an aid in the diagnosis of influenza from Nasopharyngeal swab specimens and should not be used as a sole basis for treatment. Nasal washings and aspirates are unacceptable for Xpert Xpress SARS-CoV-2/FLU/RSV testing.  Fact Sheet for Patients: EntrepreneurPulse.com.au  Fact Sheet for Healthcare Providers: IncredibleEmployment.be  This test is not yet approved or cleared by the [place] FDA and has been authorized for detection and/or diagnosis of SARS-CoV-2 by FDA under an Emergency Use Authorization (EUA). This EUA will remain in effect (meaning this test can be used) for the duration of the COVID-19 declaration under Section 564(b)(1) of the Act, 21 U.S.C. section 360bbb-3(b)(1), unless the authorization is terminated or revoked.  Performed at [facility], [place] [address], [place], [place] [address]     RADIOLOGY STUDIES/RESULTS: No results found.   LOS: 6 days   Oren Binet, MD  [facility]    To contact the attending provider between 7A-7P or the covering provider during after hours  7P-7A, please log into the web site [email or web address] and access using universal [place] password for that web site. If you do not have the password, please call the hospital operator.  07/10/2022, 12:05 PM

## 2022-07-11 DIAGNOSIS — I5032 Chronic diastolic (congestive) heart failure: Secondary | ICD-10-CM | POA: Diagnosis not present

## 2022-07-11 DIAGNOSIS — I10 Essential (primary) hypertension: Secondary | ICD-10-CM | POA: Diagnosis not present

## 2022-07-11 DIAGNOSIS — N179 Acute kidney failure, unspecified: Secondary | ICD-10-CM | POA: Diagnosis not present

## 2022-07-11 DIAGNOSIS — U071 COVID-19: Secondary | ICD-10-CM | POA: Diagnosis not present

## 2022-07-11 LAB — CBC
HCT: 29.5 % — ABNORMAL LOW (ref 36.0–46.0)
Hemoglobin: 9.7 g/dL — ABNORMAL LOW (ref 12.0–15.0)
MCH: 27.1 pg (ref 26.0–34.0)
MCHC: 32.9 g/dL (ref 30.0–36.0)
MCV: 82.4 fL (ref 80.0–100.0)
Platelets: 240 10*3/uL (ref 150–400)
RBC: 3.58 MIL/uL — ABNORMAL LOW (ref 3.87–5.11)
RDW: 14.5 % (ref 11.5–15.5)
WBC: 4.4 10*3/uL (ref 4.0–10.5)
nRBC: 0 % (ref 0.0–0.2)

## 2022-07-11 LAB — MAGNESIUM: Magnesium: 1.7 mg/dL (ref 1.7–2.4)

## 2022-07-11 LAB — BASIC METABOLIC PANEL
Anion gap: 7 (ref 5–15)
BUN: 14 mg/dL (ref 8–23)
CO2: 28 mmol/L (ref 22–32)
Calcium: 9.1 mg/dL (ref 8.9–10.3)
Chloride: 99 mmol/L (ref 98–111)
Creatinine, Ser: 0.68 mg/dL (ref 0.44–1.00)
GFR, Estimated: >60 mL/min (ref >60)
Glucose, Bld: 106 mg/dL — ABNORMAL HIGH (ref 70–99)
Potassium: 4.3 mmol/L (ref 3.5–5.1)
Sodium: 134 mmol/L — ABNORMAL LOW (ref 135–145)

## 2022-07-11 MED ORDER — MAGNESIUM SULFATE 2 GM/50ML IV SOLN
2.0000 g | Freq: Once | INTRAVENOUS | Status: AC
  Administered 2022-07-11: 2 g via INTRAVENOUS
  Filled 2022-07-11: qty 50

## 2022-07-11 NOTE — Progress Notes (Signed)
PROGRESS NOTE        PATIENT DETAILS Name: Cassandra Wu Age: 88 y.o. Sex: female Date of Birth: 1925/03/12 Admit Date: 07/03/2022 Admitting Physician Marcelyn Bruins, MD EUM:[id number], Magdalene River, FNP  Brief Summary: Patient is a 88 y.o.  female with history of HTN, chronic HFpEF, history of breast cancer (s/p mastectomy in 1998)-who developed COVID-19 infection a few days prior to this hospitalization-presented to the ED with weakness-she was subsequently admitted to the hospitalist service.  Post admit-patient had a episode of coughing spells following which she vomited and aspirated.  See below for further details.  Significant events: 11/22>>covid +ve at home, started on Paxlovid 11/23>> to ED with generalized weakness/COVID infection-admit to [facility] 11/24>> vomiting after coughing spell-food particles aspirated from airway-suspicion for aspiration pneumonia-started on Unasyn. 11/25>> several vomiting episodes overnight-abdominal x-ray with possible ileus.  Significant studies: 01/02>> echo: EF 00-86%, grade 1 diastolic dysfunction. 11/23>> CXR: No PNA 11/24>> CXR: Chronic interstitial coarsening but no obvious infiltrates. 11/25>> CXR: No obvious PNA-some chronic interstitial changes. 11/26>> CT abdomen/pelvis: No ileus/no SBO.  Mild left hydronephrosis of uncertain etiology.  Left lung base PNA.  Significant microbiology data: 11/23>> COVID PCR: Positive 11/23>> influenza PCR: Negative  Procedures: None  Consults: None  Subjective: On room air-no major issues overnight.  Objective: Vitals: Blood pressure (!) 158/97, pulse 96, temperature 98.4 F (36.9 C), temperature source Oral, resp. rate 15, height '5\' 1"'$  (1.549 m), weight 70.1 kg, SpO2 100 %.   Exam: Gen Exam:Alert awake-not in any distress HEENT:atraumatic, normocephalic Chest: B/L clear to auscultation anteriorly CVS:S1S2 regular Abdomen:soft non tender, non  distended Extremities: Trace edema Neurology: Non focal Skin: no rash  Pertinent Labs/Radiology:    Latest Ref Rng & Units 07/11/2022    4:57 AM 07/05/2022    6:04 AM 07/04/2022    2:16 AM  CBC  WBC 4.0 - 10.5 K/uL 4.4  7.9  7.6   Hemoglobin 12.0 - 15.0 g/dL 9.7  10.2  10.0   Hematocrit 36.0 - 46.0 % 29.5  30.7  30.6   Platelets 150 - 400 K/uL 240  215  220     Lab Results  Component Value Date   NA 134 (L) 07/11/2022   K 4.3 07/11/2022   CL 99 07/11/2022   CO2 28 07/11/2022      Assessment/Plan: Acute hypoxic respiratory failure likely due to aspiration pneumonia Significantly improved-hypoxia resolved-on room air  Has completed Rocephin/Flagyl x 5 days  Tolerating dysphagia 3 diet-SLP following.   COVID-19 infection Completed 5 days of Paxlovid.   CRP downtrending  Since clinically improved-on room air-no indication for steroids.  Vomiting Resolved-unclear etiology-likely from underlying COVID infection. CT abdomen/pelvis without SBO/ileus.  Minimally elevated D-dimer Due to COVID-19 infection Low suspicion for VTE given resolution of hypoxemia-he is on room air. Given stability-doubt any further workup is required For numerous variety of reasons-Doppler has not been completed for the past several days-felt to be of low yield at this point-will discontinue. Continue prophylactic Lovenox  AKI Mild Likely hemodynamically mediated Resolved  Hypokalemia/hypomagnesemia Has resolved-follow electrolytes periodically.  Chronic HFpEF Volume status stable Has some puffy legs/mild edema at baseline Continue furosemide  HTN BP stable Continue beta-blocker/furosemide.   Per my discussion with pharmacist several days ago-patient no longer on valsartan.  Normocytic anemia Chronic issue Follows with Dr. Lorenso Courier as an outpatient Follow  CBC periodically  Remote history of breast cancer s/p mastectomy 1998-followed by 5 years of tamoxifen  Severe  debility/deconditioning Due to COVID-19 infection/acute illness PT/OT following-SNF recommended Continue to counsel extensively regarding working with therapy/mobility-patient has been somewhat resistant.  Palliative care See prior notes  DNR status  If worsens-May need to start hospice discussion with family  However-Seems to have improved significantly overnight Per patient's daughter-patient is being followed by palliative care as an outpatient.  BMI: Estimated body mass index is 29.2 kg/m as calculated from the following:   Height as of this encounter: '5\' 1"'$  (1.549 m).   Weight as of this encounter: 70.1 kg.   Code status:   Code Status: DNR   DVT Prophylaxis: enoxaparin (LOVENOX) injection 40 mg Start: 07/03/22 1700    Family Communication:  Daughter-Ahmedta Via-[phone]-updated 12/1   Disposition Plan: Status is: Observation The patient will require care spanning > 2 midnights and should be moved to inpatient because: COVID-severe debility/weakness-aspiration event overnight requiring oxygen.   Planned Discharge Destination:Skilled nursing facility-medically stable-needs to complete 10 days of isolation before can go to SNF (potentially on this coming Monday)   Diet: Diet Order             DIET DYS 3 Room service appropriate? No; Fluid consistency: Thin  Diet effective now                     Antimicrobial agents: Anti-infectives (From admission, onward)    Start     Dose/Rate Route Frequency Ordered Stop   07/04/22 0600  cefTRIAXone (ROCEPHIN) 2 g in sodium chloride 0.9 % 100 mL IVPB        2 g 200 mL/hr over 30 Minutes Intravenous Daily 07/04/22 0428 07/09/22 1055   07/04/22 0600  metroNIDAZOLE (FLAGYL) IVPB 500 mg        500 mg 100 mL/hr over 60 Minutes Intravenous 2 times daily 07/04/22 0428 07/09/22 1057   07/03/22 2200  nirmatrelvir/ritonavir EUA (PAXLOVID) 3 tablet  Status:  Discontinued        3 tablet Oral 2 times daily 07/03/22 1656  07/03/22 2016   07/03/22 2200  nirmatrelvir/ritonavir EUA (renal dosing) (PAXLOVID) 2 tablet        2 tablet Oral 2 times daily 07/03/22 2016 07/06/22 2359        MEDICATIONS: Scheduled Meds:  enoxaparin (LOVENOX) injection  40 mg Subcutaneous Q24H   feeding supplement  237 mL Oral BID BM   furosemide  40 mg Oral Daily   metoprolol succinate  50 mg Oral Daily   mouth rinse  15 mL Mouth Rinse 4 times per day   pantoprazole  40 mg Oral Daily   sodium chloride flush  3 mL Intravenous Q12H   Continuous Infusions:   PRN Meds:.acetaminophen **OR** acetaminophen, guaiFENesin-dextromethorphan, hydrALAZINE, loperamide, ondansetron (ZOFRAN) IV, mouth rinse, polyethylene glycol   I have personally reviewed following labs and imaging studies  LABORATORY DATA: CBC: Recent Labs  Lab 07/05/22 0604 07/11/22 0457  WBC 7.9 4.4  HGB 10.2* 9.7*  HCT 30.7* 29.5*  MCV 81.6 82.4  PLT 215 240     Basic Metabolic Panel: Recent Labs  Lab 07/06/22 0237 07/07/22 0250 07/08/22 0541 07/09/22 0806 07/10/22 0657 07/11/22 0457  NA 134* 133* 134* 136  --  134*  K 4.0 3.1* 3.1* 3.8  --  4.3  CL 96* 97* 96* 98  --  99  CO2 '23 24 23 29  '$ --  28  GLUCOSE  104* 87 79 125*  --  106*  BUN 26* '16 10 9  '$ --  14  CREATININE 1.12* 0.75 0.70 0.61 0.65 0.68  CALCIUM 8.7* 8.7* 8.6* 9.1  --  9.1  MG 1.6*  --  1.6* 1.9  --  1.7     GFR: Estimated Creatinine Clearance: 37.7 mL/min (by C-G formula based on SCr of 0.68 mg/dL).  Liver Function Tests: Recent Labs  Lab 07/05/22 0239  AST 25  ALT 14  ALKPHOS 65  BILITOT 0.5  PROT 6.9  ALBUMIN 2.7*    No results for input(s): "LIPASE", "AMYLASE" in the last 168 hours. No results for input(s): "AMMONIA" in the last 168 hours.  Coagulation Profile: No results for input(s): "INR", "PROTIME" in the last 168 hours.  Cardiac Enzymes: No results for input(s): "CKTOTAL", "CKMB", "CKMBINDEX", "TROPONINI" in the last 168 hours.  BNP (last 3  results) No results for input(s): "PROBNP" in the last 8760 hours.  Lipid Profile: No results for input(s): "CHOL", "HDL", "LDLCALC", "TRIG", "CHOLHDL", "LDLDIRECT" in the last 72 hours.  Thyroid Function Tests: No results for input(s): "TSH", "T4TOTAL", "FREET4", "T3FREE", "THYROIDAB" in the last 72 hours.  Anemia Panel: No results for input(s): "VITAMINB12", "FOLATE", "FERRITIN", "TIBC", "IRON", "RETICCTPCT" in the last 72 hours.   Urine analysis:    Component Value Date/Time   COLORURINE AMBER (A) 07/04/2022 0415   APPEARANCEUR CLOUDY (A) 07/04/2022 0415   LABSPEC 1.015 07/04/2022 0415   PHURINE 5.0 07/04/2022 0415   GLUCOSEU NEGATIVE 07/04/2022 0415   HGBUR SMALL (A) 07/04/2022 0415   BILIRUBINUR NEGATIVE 07/04/2022 0415   KETONESUR 5 (A) 07/04/2022 0415   PROTEINUR 100 (A) 07/04/2022 0415   UROBILINOGEN 0.2 03/11/2019 1835   NITRITE POSITIVE (A) 07/04/2022 0415   LEUKOCYTESUR LARGE (A) 07/04/2022 0415    Sepsis Labs: Lactic Acid, Venous    Component Value Date/Time   LATICACIDVEN 0.9 07/03/2022 1635    MICROBIOLOGY: Recent Results (from the past 240 hour(s))  Resp Panel by RT-PCR (Flu A&B, Covid) Anterior Nasal Swab     Status: Abnormal   Collection Time: 07/03/22  2:11 PM   Specimen: Anterior Nasal Swab  Result Value Ref Range Status   SARS Coronavirus 2 by RT PCR POSITIVE (A) NEGATIVE Final    Comment: (NOTE) SARS-CoV-2 target nucleic acids are DETECTED.  The SARS-CoV-2 RNA is generally detectable in upper respiratory specimens during the acute phase of infection. Positive results are indicative of the presence of the identified virus, but do not rule out bacterial infection or co-infection with other pathogens not detected by the test. Clinical correlation with patient history and other diagnostic information is necessary to determine patient infection status. The expected result is Negative.  Fact Sheet for  Patients: EntrepreneurPulse.com.au  Fact Sheet for Healthcare Providers: IncredibleEmployment.be  This test is not yet approved or cleared by the [place] FDA and  has been authorized for detection and/or diagnosis of SARS-CoV-2 by FDA under an Emergency Use Authorization (EUA).  This EUA will remain in effect (meaning this test can be used) for the duration of  the COVID-19 declaration under Section 564(b)(1) of the A ct, 21 U.S.C. section 360bbb-3(b)(1), unless the authorization is terminated or revoked sooner.     Influenza A by PCR NEGATIVE NEGATIVE Final   Influenza B by PCR NEGATIVE NEGATIVE Final    Comment: (NOTE) The Xpert Xpress SARS-CoV-2/FLU/RSV plus assay is intended as an aid in the diagnosis of influenza from Nasopharyngeal swab specimens and should not  be used as a sole basis for treatment. Nasal washings and aspirates are unacceptable for Xpert Xpress SARS-CoV-2/FLU/RSV testing.  Fact Sheet for Patients: EntrepreneurPulse.com.au  Fact Sheet for Healthcare Providers: IncredibleEmployment.be  This test is not yet approved or cleared by the [place] FDA and has been authorized for detection and/or diagnosis of SARS-CoV-2 by FDA under an Emergency Use Authorization (EUA). This EUA will remain in effect (meaning this test can be used) for the duration of the COVID-19 declaration under Section 564(b)(1) of the Act, 21 U.S.C. section 360bbb-3(b)(1), unless the authorization is terminated or revoked.  Performed at [facility], [place] [address] [place], [place] [address]     RADIOLOGY STUDIES/RESULTS: No results found.   LOS: 7 days   Oren Binet, MD  [facility]    To contact the attending provider between 7A-7P or the covering provider during after hours 7P-7A, please log into the web site [email or web address] and access using universal [place] password for that  web site. If you do not have the password, please call the hospital operator.  07/11/2022, 1:52 PM

## 2022-07-11 NOTE — Progress Notes (Signed)
Physical Therapy Treatment Patient Details Name: Cassandra Wu MRN: [id number] DOB: 1924-12-09 Today's Date: 07/11/2022   History of Present Illness 88 y.o. female with medical history significant of hypertension, TIA, anemia, diastolic CHF, lymphedema, history of mastectomy, history of breast cancer, history of GI bleed presenting 11/23 with COVID and generalized decline.    PT Comments    Pt was seen for mobility to get to the chair, and demonstrates good control of BLE's in steps once her commitment to the move was made.  Pt is getting up to stand with practice initially on walker and could not step back to rebalance on feet.  Once up with PT actively resists moving legs, but finally her effort was more coordinated.  Follow up with nursing and asked them to use stedy to return to bed given pt's clear issues of being in control of standing and stepping to the chair.  Follow along with her for goals of acute PT.    Recommendations for follow up therapy are one component of a multi-disciplinary discharge planning process, led by the attending physician.  Recommendations may be updated based on patient status, additional functional criteria and insurance authorization.  Follow Up Recommendations  Skilled nursing-short term rehab (<3 hours/day) Can patient physically be transported by private vehicle: No   Assistance Recommended at Discharge Frequent or constant Supervision/Assistance  Patient can return home with the following Two people to help with walking and/or transfers;Two people to help with bathing/dressing/bathroom;Assist for transportation;Help with stairs or ramp for entrance;Assistance with cooking/housework   Equipment Recommendations  None recommended by PT    Recommendations for Other Services       Precautions / Restrictions Precautions Precautions: Fall Restrictions Weight Bearing Restrictions: No     Mobility  Bed Mobility Overal bed mobility: Needs  Assistance Bed Mobility: Supine to Sit Rolling: Mod assist   Supine to sit: Mod assist     General bed mobility comments: mod assist with minor help to LE's and pt reports sensitivity on legs over edematous areas    Transfers Overall transfer level: Needs assistance Equipment used: Rolling walker (2 wheels) Transfers: Sit to/from Stand, Bed to chair/wheelchair/BSC Sit to Stand: Max assist, From elevated surface   Step pivot transfers: Mod assist, From elevated surface       General transfer comment: requires mult attempts and finally pt made one good effort to get to chair as PT was helping her back to bed    Ambulation/Gait                   Stairs             Wheelchair Mobility    Modified Rankin (Stroke Patients Only)       Balance Overall balance assessment: Needs assistance Sitting-balance support: Feet supported Sitting balance-Leahy Scale: Fair     Standing balance support: Bilateral upper extremity supported, During functional activity Standing balance-Leahy Scale: Poor Standing balance comment: poor balance but made good use of her walker after refusing to step with PT assisting her                            Cognition Arousal/Alertness: Awake/alert Behavior During Therapy: Flat affect Overall Cognitive Status: Within Functional Limits for tasks assessed                     Current Attention Level: Selective   Following Commands: Follows one step commands with  increased time     Problem Solving: Requires verbal cues, Requires tactile cues General Comments: Pt is self limiting but does want to succeed and finally moves with PT to chair over her motivation to get there        Exercises      General Comments General comments (skin integrity, edema, etc.): Pt is very strong but self limiting, could stand and initially would not step but if PT tried to directly pivot she could resist and stand still       Pertinent Vitals/Pain Pain Assessment Pain Assessment: Faces Faces Pain Scale: Hurts little more Pain Location: bottom Pain Descriptors / Indicators: Sore Pain Intervention(s): Repositioned    Home Living                          Prior Function            PT Goals (current goals can now be found in the care plan section) Acute Rehab PT Goals Patient Stated Goal: feel stronger Progress towards PT goals: Progressing toward goals    Frequency    Min 2X/week      PT Plan Current plan remains appropriate    Co-evaluation              AM-PAC PT "6 Clicks" Mobility   Outcome Measure  Help needed turning from your back to your side while in a flat bed without using bedrails?: A Lot Help needed moving from lying on your back to sitting on the side of a flat bed without using bedrails?: A Lot Help needed moving to and from a bed to a chair (including a wheelchair)?: A Lot Help needed standing up from a chair using your arms (e.g., wheelchair or bedside chair)?: A Lot Help needed to walk in hospital room?: Total Help needed climbing 3-5 steps with a railing? : Total 6 Click Score: 10    End of Session Equipment Utilized During Treatment: Gait belt Activity Tolerance: Patient limited by fatigue;Treatment limited secondary to medical complications (Comment) Patient left: in chair;with call bell/phone within reach;with chair alarm set Nurse Communication: Mobility status PT Visit Diagnosis: Other abnormalities of gait and mobility (R26.89);Muscle weakness (generalized) (M62.81);Other symptoms and signs involving the nervous system (R29.898)     Time: 9935-7017 PT Time Calculation (min) (ACUTE ONLY): 38 min  Charges:  $Therapeutic Activity: 23-37 mins $Neuromuscular Re-education: 8-22 mins    Ramond Dial 07/11/2022, 5:00 PM  Mee Hives, PT PhD Acute Rehab Dept. Number: [place] [facility] [place]

## 2022-07-12 DIAGNOSIS — U071 COVID-19: Secondary | ICD-10-CM | POA: Diagnosis not present

## 2022-07-12 MED ORDER — BENZONATATE 100 MG PO CAPS
200.0000 mg | ORAL_CAPSULE | Freq: Three times a day (TID) | ORAL | Status: DC | PRN
  Administered 2022-07-12 – 2022-07-14 (×3): 200 mg via ORAL
  Filled 2022-07-12 (×3): qty 2

## 2022-07-12 NOTE — Progress Notes (Signed)
PROGRESS NOTE        PATIENT DETAILS Name: Cassandra Wu Age: 88 y.o. Sex: female Date of Birth: [birth year] Admit Date: 07/03/2022 Admitting Physician Marcelyn Bruins, MD ZOX:[id number], [place], FNP  Brief Summary: Patient is a 88 y.o.  female with history of HTN, chronic HFpEF, history of breast cancer (s/p mastectomy in 1998)-who developed COVID-19 infection a few days prior to this hospitalization-presented to the ED with weakness-she was subsequently admitted to the hospitalist service.  Post admit-patient had a episode of coughing spells following which she vomited and aspirated.  See below for further details.  Significant events: 11/22>>covid +ve at home, started on Paxlovid 11/23>> to ED with generalized weakness/COVID infection-admit to [facility] 11/24>> vomiting after coughing spell-food particles aspirated from airway-suspicion for aspiration pneumonia-started on Unasyn. 11/25>> several vomiting episodes overnight-abdominal x-ray with possible ileus.  Significant studies: 01/02>> echo: EF 19-14%, grade 1 diastolic dysfunction. 11/23>> CXR: No PNA 11/24>> CXR: Chronic interstitial coarsening but no obvious infiltrates. 11/25>> CXR: No obvious PNA-some chronic interstitial changes. 11/26>> CT abdomen/pelvis: No ileus/no SBO.  Mild left hydronephrosis of uncertain etiology.  Left lung base PNA.  Significant microbiology data: 11/23>> COVID PCR: Positive 11/23>> influenza PCR: Negative  Procedures: None  Consults: None  Subjective:  Patient in bed, appears comfortable, denies any headache, no fever, no chest pain or pressure, no shortness of breath , no abdominal pain. No new focal weakness.   Objective: Vitals: Blood pressure (!) 150/92, pulse 99, temperature 99.3 F (37.4 C), temperature source Oral, resp. rate 20, height '5\' 1"'$  (1.549 m), weight 70.1 kg, SpO2 97 %.   Exam:  Awake Alert, No new F.N deficits, Normal  affect [place].AT,PERRAL Supple Neck, No JVD,   Symmetrical Chest wall movement, Good air movement bilaterally, few rales RRR,No Gallops, Rubs or new Murmurs,  +ve B.Sounds, Abd Soft, No tenderness,   No Cyanosis, Clubbing or edema    Assessment/Plan:  Acute hypoxic respiratory failure likely due to aspiration pneumonia Significantly improved-hypoxia resolved-on room air  Has completed Rocephin/Flagyl x 5 days  Tolerating dysphagia 3 diet-SLP following.   COVID-19 infection Completed 5 days of Paxlovid.   CRP downtrending  Since clinically improved-on room air-no indication for steroids.  Vomiting Resolved-unclear etiology-likely from underlying COVID infection. CT abdomen/pelvis without SBO/ileus.  Minimally elevated D-dimer Due to COVID-19 infection Low suspicion for VTE given resolution of hypoxemia-he is on room air. Given stability-doubt any further workup is required For numerous variety of reasons-Doppler has not been completed for the past several days-felt to be of low yield at this point-will discontinue. Continue prophylactic Lovenox  AKI Mild Likely hemodynamically mediated Resolved  Hypokalemia/hypomagnesemia Has resolved-follow electrolytes periodically.  Chronic HFpEF Volume status stable Has some puffy legs/mild edema at baseline Continue furosemide  HTN BP stable Continue beta-blocker/furosemide.   Per my discussion with pharmacist several days ago-patient no longer on valsartan.  Normocytic anemia Chronic issue Follows with Dr. Lorenso Courier as an outpatient Follow CBC periodically  Remote history of breast cancer s/p mastectomy 1998-followed by 5 years of tamoxifen  Severe debility/deconditioning Due to COVID-19 infection/acute illness PT/OT following-SNF recommended Continue to counsel extensively regarding working with therapy/mobility-patient has been somewhat resistant.  Palliative care See prior notes  DNR status  If worsens-May need to  start hospice discussion with family  However-Seems to have improved significantly overnight Per patient's daughter-patient is being  followed by palliative care as an outpatient.  BMI: Estimated body mass index is 29.2 kg/m as calculated from the following:   Height as of this encounter: '5\' 1"'$  (1.549 m).   Weight as of this encounter: 70.1 kg.   Code status:   Code Status: DNR   DVT Prophylaxis: enoxaparin (LOVENOX) injection 40 mg Start: 07/03/22 1700    Family Communication:  Daughter-Ahmedta Via-[phone]-updated 12/1   Disposition Plan: Status is: Observation The patient will require care spanning > 2 midnights and should be moved to inpatient because: COVID-severe debility/weakness-aspiration event overnight requiring oxygen.   Planned Discharge Destination:Skilled nursing facility-medically stable-needs to complete 10 days of isolation before can go to SNF (potentially on this coming Monday)   Diet: Diet Order             DIET DYS 3 Room service appropriate? No; Fluid consistency: Thin  Diet effective now                    MEDICATIONS: Scheduled Meds:  enoxaparin (LOVENOX) injection  40 mg Subcutaneous Q24H   feeding supplement  237 mL Oral BID BM   furosemide  40 mg Oral Daily   metoprolol succinate  50 mg Oral Daily   mouth rinse  15 mL Mouth Rinse 4 times per day   pantoprazole  40 mg Oral Daily   sodium chloride flush  3 mL Intravenous Q12H   Continuous Infusions:   PRN Meds:.acetaminophen **OR** acetaminophen, benzonatate, guaiFENesin-dextromethorphan, hydrALAZINE, loperamide, ondansetron (ZOFRAN) IV, mouth rinse, polyethylene glycol   I have personally reviewed following labs and imaging studies  LABORATORY DATA:  Recent Labs  Lab 07/11/22 0457  WBC 4.4  HGB 9.7*  HCT 29.5*  PLT 240  MCV 82.4  MCH 27.1  MCHC 32.9  RDW 14.5    Recent Labs  Lab 07/06/22 0237 07/07/22 0250 07/08/22 0541 07/09/22 0806 07/10/22 0657  07/11/22 0457  NA 134* 133* 134* 136  --  134*  K 4.0 3.1* 3.1* 3.8  --  4.3  CL 96* 97* 96* 98  --  99  CO2 '23 24 23 29  '$ --  28  GLUCOSE 104* 87 79 125*  --  106*  BUN 26* '16 10 9  '$ --  14  CREATININE 1.12* 0.75 0.70 0.61 0.65 0.68  CRP 15.9* 13.3* 12.0*  --   --   --   MG 1.6*  --  1.6* 1.9  --  1.7  CALCIUM 8.7* 8.7* 8.6* 9.1  --  9.1   RADIOLOGY STUDIES/RESULTS: No results found.   LOS: 8 days   Signature  -    Lala Lund M.D on 07/12/2022 at 11:27 AM   -  To page go to [email or web address]

## 2022-07-12 NOTE — Progress Notes (Signed)
[facility] night cross cover note:   I was notified by RN that the patient is requesting an additional anti-tussive agent after experiencing no significant improvement in her cough following prn dose of guaifenesin-dextromethorphan.   I subsequently added prn Tessalon Perles for cough.      Babs Bertin, DO Hospitalist

## 2022-07-13 DIAGNOSIS — U071 COVID-19: Secondary | ICD-10-CM | POA: Diagnosis not present

## 2022-07-13 LAB — CBC WITH DIFFERENTIAL/PLATELET
Abs Immature Granulocytes: 0.08 10*3/uL — ABNORMAL HIGH (ref 0.00–0.07)
Basophils Absolute: 0 10*3/uL (ref 0.0–0.1)
Basophils Relative: 0 %
Eosinophils Absolute: 0 10*3/uL (ref 0.0–0.5)
Eosinophils Relative: 1 %
HCT: 25.9 % — ABNORMAL LOW (ref 36.0–46.0)
Hemoglobin: 8.7 g/dL — ABNORMAL LOW (ref 12.0–15.0)
Immature Granulocytes: 1 %
Lymphocytes Relative: 19 %
Lymphs Abs: 1.1 10*3/uL (ref 0.7–4.0)
MCH: 26.7 pg (ref 26.0–34.0)
MCHC: 33.6 g/dL (ref 30.0–36.0)
MCV: 79.4 fL — ABNORMAL LOW (ref 80.0–100.0)
Monocytes Absolute: 1 10*3/uL (ref 0.1–1.0)
Monocytes Relative: 16 %
Neutro Abs: 3.8 10*3/uL (ref 1.7–7.7)
Neutrophils Relative %: 63 %
Platelets: 250 10*3/uL (ref 150–400)
RBC: 3.26 MIL/uL — ABNORMAL LOW (ref 3.87–5.11)
RDW: 14.5 % (ref 11.5–15.5)
WBC: 6.1 10*3/uL (ref 4.0–10.5)
nRBC: 0 % (ref 0.0–0.2)

## 2022-07-13 LAB — BASIC METABOLIC PANEL
Anion gap: 10 (ref 5–15)
BUN: 13 mg/dL (ref 8–23)
CO2: 28 mmol/L (ref 22–32)
Calcium: 9.1 mg/dL (ref 8.9–10.3)
Chloride: 92 mmol/L — ABNORMAL LOW (ref 98–111)
Creatinine, Ser: 0.73 mg/dL (ref 0.44–1.00)
GFR, Estimated: >60 mL/min (ref >60)
Glucose, Bld: 114 mg/dL — ABNORMAL HIGH (ref 70–99)
Potassium: 4 mmol/L (ref 3.5–5.1)
Sodium: 130 mmol/L — ABNORMAL LOW (ref 135–145)

## 2022-07-13 LAB — MAGNESIUM: Magnesium: 1.8 mg/dL (ref 1.7–2.4)

## 2022-07-13 LAB — BRAIN NATRIURETIC PEPTIDE: B Natriuretic Peptide: 77.4 pg/mL (ref 0.0–100.0)

## 2022-07-13 NOTE — Progress Notes (Signed)
PROGRESS NOTE        PATIENT DETAILS Name: Cassandra Wu Age: 88 y.o. Sex: female Date of Birth: May 09, 1925 Admit Date: 07/03/2022 Admitting Physician Marcelyn Bruins, MD HBZ:[id number], [place], FNP  Brief Summary: Patient is a 88 y.o.  female with history of HTN, chronic HFpEF, history of breast cancer (s/p mastectomy in 1998)-who developed COVID-19 infection a few days prior to this hospitalization-presented to the ED with weakness-she was subsequently admitted to the hospitalist service.  Post admit-patient had a episode of coughing spells following which she vomited and aspirated.  See below for further details.  Significant events: 11/22>>covid +ve at home, started on Paxlovid 11/23>> to ED with generalized weakness/COVID infection-admit to [facility] 11/24>> vomiting after coughing spell-food particles aspirated from airway-suspicion for aspiration pneumonia-started on Unasyn. 11/25>> several vomiting episodes overnight-abdominal x-ray with possible ileus.  Significant studies: 01/02>> echo: EF 17-51%, grade 1 diastolic dysfunction. 11/23>> CXR: No PNA 11/24>> CXR: Chronic interstitial coarsening but no obvious infiltrates. 11/25>> CXR: No obvious PNA-some chronic interstitial changes. 11/26>> CT abdomen/pelvis: No ileus/no SBO.  Mild left hydronephrosis of uncertain etiology.  Left lung base PNA.  Significant microbiology data: 11/23>> COVID PCR: Positive 11/23>> influenza PCR: Negative  Procedures: None  Consults: None  Subjective:  Patient in bed, appears comfortable, denies any headache, no fever, no chest pain or pressure, no shortness of breath , no abdominal pain. No new focal weakness.    Objective: Vitals: Blood pressure 136/79, pulse 86, temperature 98 F (36.7 C), temperature source Oral, resp. rate 20, height '5\' 1"'$  (1.549 m), weight 68.4 kg, SpO2 95 %.   Exam:  Awake Alert, No new F.N deficits, Normal  affect [place].AT,PERRAL Supple Neck, No JVD,   Symmetrical Chest wall movement, Good air movement bilaterally, CTAB RRR,No Gallops, Rubs or new Murmurs,  +ve B.Sounds, Abd Soft, No tenderness,   No Cyanosis, Clubbing or edema    Assessment/Plan:  Acute hypoxic respiratory failure likely due to aspiration pneumonia Significantly improved-hypoxia resolved-on room air  Has completed Rocephin/Flagyl x 5 days  Tolerating dysphagia 3 diet-SLP following.   COVID-19 infection Completed 5 days of Paxlovid.   CRP downtrending  Since clinically improved-on room air-no indication for steroids.  Vomiting Resolved-unclear etiology-likely from underlying COVID infection. CT abdomen/pelvis without SBO/ileus.  Minimally elevated D-dimer Due to COVID-19 infection Low suspicion for VTE given resolution of hypoxemia-he is on room air. Given stability-doubt any further workup is required For numerous variety of reasons-Doppler has not been completed for the past several days-felt to be of low yield at this point-will discontinue. Continue prophylactic Lovenox  AKI Mild Likely hemodynamically mediated Resolved  Hypokalemia/hypomagnesemia Has resolved-follow electrolytes periodically.  Chronic HFpEF Volume status stable Has some puffy legs/mild edema at baseline Continue furosemide  HTN BP stable Continue beta-blocker/furosemide.   Per my discussion with pharmacist several days ago-patient no longer on valsartan.  Normocytic anemia Chronic issue Follows with Dr. Lorenso Courier as an outpatient Follow CBC periodically  Remote history of breast cancer s/p mastectomy 1998-followed by 5 years of tamoxifen  Severe debility/deconditioning Due to COVID-19 infection/acute illness PT/OT following-SNF recommended Continue to counsel extensively regarding working with therapy/mobility-patient has been somewhat resistant.  Palliative care See prior notes  DNR status  If worsens-May need to start  hospice discussion with family  However-Seems to have improved significantly overnight Per patient's daughter-patient is being followed  by palliative care as an outpatient.  BMI: Estimated body mass index is 28.49 kg/m as calculated from the following:   Height as of this encounter: '5\' 1"'$  (1.549 m).   Weight as of this encounter: 68.4 kg.   Code status:   Code Status: DNR   DVT Prophylaxis: enoxaparin (LOVENOX) injection 40 mg Start: 07/03/22 1700    Family Communication:  Daughter-Ahmedta Via-[phone]-updated 12/1   Disposition Plan: Status is: Observation The patient will require care spanning > 2 midnights and should be moved to inpatient because: COVID-severe debility/weakness-aspiration event overnight requiring oxygen.   Planned Discharge Destination:Skilled nursing facility-medically stable-needs to complete 10 days of isolation before can go to SNF (potentially on this coming Monday)   Diet: Diet Order             DIET DYS 3 Room service appropriate? No; Fluid consistency: Thin  Diet effective now                    MEDICATIONS: Scheduled Meds:  enoxaparin (LOVENOX) injection  40 mg Subcutaneous Q24H   feeding supplement  237 mL Oral BID BM   furosemide  40 mg Oral Daily   metoprolol succinate  50 mg Oral Daily   mouth rinse  15 mL Mouth Rinse 4 times per day   pantoprazole  40 mg Oral Daily   sodium chloride flush  3 mL Intravenous Q12H   Continuous Infusions:   PRN Meds:.acetaminophen **OR** acetaminophen, benzonatate, guaiFENesin-dextromethorphan, hydrALAZINE, loperamide, ondansetron (ZOFRAN) IV, mouth rinse, polyethylene glycol   I have personally reviewed following labs and imaging studies  LABORATORY DATA:  Recent Labs  Lab 07/11/22 0457 07/13/22 0148  WBC 4.4 6.1  HGB 9.7* 8.7*  HCT 29.5* 25.9*  PLT 240 250  MCV 82.4 79.4*  MCH 27.1 26.7  MCHC 32.9 33.6  RDW 14.5 14.5  LYMPHSABS  --  1.1  MONOABS  --  1.0  EOSABS  --  0.0   BASOSABS  --  0.0    Recent Labs  Lab 07/07/22 0250 07/08/22 0541 07/09/22 0806 07/10/22 0657 07/11/22 0457 07/13/22 0148  NA 133* 134* 136  --  134* 130*  K 3.1* 3.1* 3.8  --  4.3 4.0  CL 97* 96* 98  --  99 92*  CO2 '24 23 29  '$ --  28 28  GLUCOSE 87 79 125*  --  106* 114*  BUN '16 10 9  '$ --  14 13  CREATININE 0.75 0.70 0.61 0.65 0.68 0.73  CRP 13.3* 12.0*  --   --   --   --   BNP  --   --   --   --   --  77.4  MG  --  1.6* 1.9  --  1.7 1.8  CALCIUM 8.7* 8.6* 9.1  --  9.1 9.1   RADIOLOGY STUDIES/RESULTS: No results found.   LOS: 9 days   Signature  -    Lala Lund M.D on 07/13/2022 at 9:17 AM   -  To page go to [email or web address]

## 2022-07-14 ENCOUNTER — Inpatient Hospital Stay (HOSPITAL_COMMUNITY): Payer: Medicare Other

## 2022-07-14 DIAGNOSIS — R7989 Other specified abnormal findings of blood chemistry: Secondary | ICD-10-CM

## 2022-07-14 DIAGNOSIS — U071 COVID-19: Secondary | ICD-10-CM

## 2022-07-14 LAB — SODIUM, URINE, RANDOM: Sodium, Ur: 17 mmol/L

## 2022-07-14 LAB — BRAIN NATRIURETIC PEPTIDE: B Natriuretic Peptide: 54.2 pg/mL (ref 0.0–100.0)

## 2022-07-14 LAB — BASIC METABOLIC PANEL
Anion gap: 10 (ref 5–15)
BUN: 16 mg/dL (ref 8–23)
CO2: 27 mmol/L (ref 22–32)
Calcium: 8.9 mg/dL (ref 8.9–10.3)
Chloride: 92 mmol/L — ABNORMAL LOW (ref 98–111)
Creatinine, Ser: 0.74 mg/dL (ref 0.44–1.00)
GFR, Estimated: >60 mL/min (ref >60)
Glucose, Bld: 108 mg/dL — ABNORMAL HIGH (ref 70–99)
Potassium: 4.2 mmol/L (ref 3.5–5.1)
Sodium: 129 mmol/L — ABNORMAL LOW (ref 135–145)

## 2022-07-14 LAB — C-REACTIVE PROTEIN: CRP: 11.2 mg/dL — ABNORMAL HIGH (ref <1.0)

## 2022-07-14 LAB — D-DIMER, QUANTITATIVE: D-Dimer, Quant: 1.95 ug/mL-FEU — ABNORMAL HIGH (ref 0.00–0.50)

## 2022-07-14 LAB — URIC ACID: Uric Acid, Serum: 5.6 mg/dL (ref 2.5–7.1)

## 2022-07-14 LAB — OSMOLALITY: Osmolality: 277 mOsm/kg (ref 275–295)

## 2022-07-14 LAB — OSMOLALITY, URINE: Osmolality, Ur: 168 mOsm/kg — ABNORMAL LOW (ref 300–900)

## 2022-07-14 LAB — CREATININE, URINE, RANDOM: Creatinine, Urine: 27 mg/dL

## 2022-07-14 MED ORDER — FUROSEMIDE 40 MG PO TABS: 40.0000 mg | ORAL_TABLET | Freq: Every day | ORAL | Status: DC

## 2022-07-14 MED ORDER — LACTATED RINGERS IV SOLN: INTRAVENOUS | Status: AC

## 2022-07-14 NOTE — TOC Progression Note (Signed)
Transition of Care [facility]) - Progression Note    Patient Details  Name: Cassandra Wu MRN: [id number] Date of Birth: 02/09/1925  Transition of Care [facility]) CM/SW [place], LCSW Phone Number: 07/14/2022, 8:37 AM  Clinical Narrative:    [facility] able to accept patient today, Insurance approval Ref# [id number] ID# [id number], effective 07/10/2022-07/14/2022.   Expected Discharge Plan: [place] Barriers to Discharge: Barriers Resolved  Expected Discharge Plan and Services Expected Discharge Plan: [place] In-house Referral: Clinical Social Work   Post Acute Care Choice: [place] Living arrangements for the past 2 months: Single Family Home                                       Social Determinants of Health (SDOH) Interventions    Readmission Risk Interventions     No data to display

## 2022-07-14 NOTE — Progress Notes (Addendum)
PROGRESS NOTE        PATIENT DETAILS Name: Cassandra Wu Age: 88 y.o. Sex: female Date of Birth: August 11, 1925 Admit Date: 07/03/2022 Admitting Physician Marcelyn Bruins, MD VVO:[id number], Magdalene River, FNP  Brief Summary: Patient is a 88 y.o.  female with history of HTN, chronic HFpEF, history of breast cancer (s/p mastectomy in 1998)-who developed COVID-19 infection a few days prior to this hospitalization-presented to the ED with weakness-she was subsequently admitted to the hospitalist service.  Post admit-patient had a episode of coughing spells following which she vomited and aspirated.  See below for further details.  Significant events: 11/22>>covid +ve at home, started on Paxlovid 11/23>> to ED with generalized weakness/COVID infection-admit to [facility] 11/24>> vomiting after coughing spell-food particles aspirated from airway-suspicion for aspiration pneumonia-started on Unasyn. 11/25>> several vomiting episodes overnight-abdominal x-ray with possible ileus.  Significant studies: 01/02>> echo: EF 94-85%, grade 1 diastolic dysfunction. 11/23>> CXR: No PNA 11/24>> CXR: Chronic interstitial coarsening but no obvious infiltrates. 11/25>> CXR: No obvious PNA-some chronic interstitial changes. 11/26>> CT abdomen/pelvis: No ileus/no SBO.  Mild left hydronephrosis of uncertain etiology.  Left lung base PNA.  Significant microbiology data: 11/23>> COVID PCR: Positive 11/23>> influenza PCR: Negative  Procedures: None  Consults: None  Subjective:  Patient in bed, appears comfortable, denies any headache, no fever, no chest pain or pressure, no shortness of breath , no abdominal pain. No new focal weakness.  Objective: Vitals: Blood pressure 138/78, pulse 89, temperature 98.7 F (37.1 C), temperature source Oral, resp. rate 20, height '5\' 1"'$  (1.549 m), weight 68.4 kg, SpO2 96 %.   Exam:  Awake Alert, No new F.N deficits, Normal  affect Ashley.AT,PERRAL Supple Neck, No JVD,   Symmetrical Chest wall movement, Good air movement bilaterally, CTAB RRR,No Gallops, Rubs or new Murmurs,  +ve B.Sounds, Abd Soft, No tenderness,   No Cyanosis, Clubbing or edema    Assessment/Plan:  Acute hypoxic respiratory failure likely due to aspiration pneumonia Significantly improved-hypoxia resolved-on room air  Has completed Rocephin/Flagyl x 5 days  Tolerating dysphagia 3 diet-SLP following.   COVID-19 infection Completed 5 days of Paxlovid.   CRP downtrending  Since clinically improved-on room air-no indication for steroids.  Vomiting Resolved-unclear etiology-likely from underlying COVID infection. CT abdomen/pelvis without SBO/ileus.  Elevated D-dimer Due to COVID-19 infection Check [place] and trend, SOB resolved, clinically ruled out PE  Hyponatremia. Could be dehydration, hold Lasix, check serum osmolality, urine sodium and osmolality.  Monitor.    AKI Mild Likely hemodynamically mediated Resolved  Hypokalemia/hypomagnesemia Has resolved-follow electrolytes periodically.  Chronic HFpEF Volume status stable Has some puffy legs/mild edema at baseline Continue furosemide  HTN BP stable Continue beta-blocker/furosemide.   Per my discussion with pharmacist several days ago-patient no longer on valsartan.  Normocytic anemia Chronic issue Follows with Dr. Lorenso Courier as an outpatient Follow CBC periodically  Remote history of breast cancer s/p mastectomy 1998-followed by 5 years of tamoxifen  Severe debility/deconditioning Due to COVID-19 infection/acute illness PT/OT following-SNF recommended Continue to counsel extensively regarding working with therapy/mobility-patient has been somewhat resistant.  Palliative care See prior notes  DNR status  If worsens-May need to start hospice discussion with family  However-Seems to have improved significantly overnight Per patient's daughter-patient is being followed by  palliative care as an outpatient.  BMI: Estimated body mass index is 28.49 kg/m as calculated from the following:  Height as of this encounter: '5\' 1"'$  (1.549 m).   Weight as of this encounter: 68.4 kg.   Code status:   Code Status: DNR   DVT Prophylaxis: enoxaparin (LOVENOX) injection 40 mg Start: 07/03/22 1700    Family Communication:  Daughter-Ahmedta Via-[phone]-updated 07/14/22   Disposition Plan: Status is: Observation The patient will require care spanning > 2 midnights and should be moved to inpatient because: COVID-severe debility/weakness-aspiration event overnight requiring oxygen.   Planned Discharge Destination:Skilled nursing facility-medically stable-needs to complete 10 days of isolation before can go to SNF (potentially on this coming Monday)   Diet: Diet Order             DIET DYS 3 Room service appropriate? No; Fluid consistency: Thin  Diet effective now                    MEDICATIONS: Scheduled Meds:  enoxaparin (LOVENOX) injection  40 mg Subcutaneous Q24H   feeding supplement  237 mL Oral BID BM   metoprolol succinate  50 mg Oral Daily   mouth rinse  15 mL Mouth Rinse 4 times per day   pantoprazole  40 mg Oral Daily   sodium chloride flush  3 mL Intravenous Q12H   Continuous Infusions:  lactated ringers 100 mL/hr at 07/14/22 1017    PRN Meds:.acetaminophen **OR** acetaminophen, benzonatate, guaiFENesin-dextromethorphan, hydrALAZINE, loperamide, ondansetron (ZOFRAN) IV, mouth rinse, polyethylene glycol   I have personally reviewed following labs and imaging studies  LABORATORY DATA:  Recent Labs  Lab 07/11/22 0457 07/13/22 0148  WBC 4.4 6.1  HGB 9.7* 8.7*  HCT 29.5* 25.9*  PLT 240 250  MCV 82.4 79.4*  MCH 27.1 26.7  MCHC 32.9 33.6  RDW 14.5 14.5  LYMPHSABS  --  1.1  MONOABS  --  1.0  EOSABS  --  0.0  BASOSABS  --  0.0    Recent Labs  Lab 07/08/22 0541 07/09/22 0806 07/10/22 0657 07/11/22 0457 07/13/22 0148  07/14/22 0758  NA 134* 136  --  134* 130* 129*  K 3.1* 3.8  --  4.3 4.0 4.2  CL 96* 98  --  99 92* 92*  CO2 23 29  --  '28 28 27  '$ GLUCOSE 79 125*  --  106* 114* 108*  BUN 10 9  --  '14 13 16  '$ CREATININE 0.70 0.61 0.65 0.68 0.73 0.74  CRP 12.0*  --   --   --   --  11.2*  DDIMER  --   --   --   --   --  1.95*  BNP  --   --   --   --  77.4 54.2  MG 1.6* 1.9  --  1.7 1.8  --   CALCIUM 8.6* 9.1  --  9.1 9.1 8.9   RADIOLOGY STUDIES/RESULTS: No results found.   LOS: 10 days   Signature  -    Lala Lund M.D on 07/14/2022 at 10:58 AM   -  To page go to [email or web address]

## 2022-07-15 DIAGNOSIS — R0989 Other specified symptoms and signs involving the circulatory and respiratory systems: Secondary | ICD-10-CM | POA: Diagnosis not present

## 2022-07-15 DIAGNOSIS — I5030 Unspecified diastolic (congestive) heart failure: Secondary | ICD-10-CM | POA: Diagnosis not present

## 2022-07-15 DIAGNOSIS — K5900 Constipation, unspecified: Secondary | ICD-10-CM | POA: Diagnosis not present

## 2022-07-15 DIAGNOSIS — K219 Gastro-esophageal reflux disease without esophagitis: Secondary | ICD-10-CM | POA: Diagnosis not present

## 2022-07-15 DIAGNOSIS — I5033 Acute on chronic diastolic (congestive) heart failure: Secondary | ICD-10-CM | POA: Diagnosis not present

## 2022-07-15 DIAGNOSIS — Z8673 Personal history of transient ischemic attack (TIA), and cerebral infarction without residual deficits: Secondary | ICD-10-CM | POA: Diagnosis not present

## 2022-07-15 DIAGNOSIS — J690 Pneumonitis due to inhalation of food and vomit: Secondary | ICD-10-CM | POA: Diagnosis not present

## 2022-07-15 DIAGNOSIS — Z9010 Acquired absence of unspecified breast and nipple: Secondary | ICD-10-CM | POA: Diagnosis not present

## 2022-07-15 DIAGNOSIS — Z853 Personal history of malignant neoplasm of breast: Secondary | ICD-10-CM | POA: Diagnosis not present

## 2022-07-15 DIAGNOSIS — D649 Anemia, unspecified: Secondary | ICD-10-CM | POA: Diagnosis not present

## 2022-07-15 DIAGNOSIS — R059 Cough, unspecified: Secondary | ICD-10-CM | POA: Diagnosis not present

## 2022-07-15 DIAGNOSIS — J9601 Acute respiratory failure with hypoxia: Secondary | ICD-10-CM | POA: Diagnosis not present

## 2022-07-15 DIAGNOSIS — I10 Essential (primary) hypertension: Secondary | ICD-10-CM | POA: Diagnosis not present

## 2022-07-15 DIAGNOSIS — R531 Weakness: Secondary | ICD-10-CM | POA: Diagnosis not present

## 2022-07-15 DIAGNOSIS — U071 COVID-19: Secondary | ICD-10-CM | POA: Diagnosis not present

## 2022-07-15 DIAGNOSIS — R791 Abnormal coagulation profile: Secondary | ICD-10-CM | POA: Diagnosis not present

## 2022-07-15 DIAGNOSIS — Z7401 Bed confinement status: Secondary | ICD-10-CM | POA: Diagnosis not present

## 2022-07-15 DIAGNOSIS — I5032 Chronic diastolic (congestive) heart failure: Secondary | ICD-10-CM | POA: Diagnosis not present

## 2022-07-15 LAB — BASIC METABOLIC PANEL
Anion gap: 8 (ref 5–15)
BUN: 16 mg/dL (ref 8–23)
CO2: 28 mmol/L (ref 22–32)
Calcium: 8.8 mg/dL — ABNORMAL LOW (ref 8.9–10.3)
Chloride: 94 mmol/L — ABNORMAL LOW (ref 98–111)
Creatinine, Ser: 0.65 mg/dL (ref 0.44–1.00)
GFR, Estimated: >60 mL/min (ref >60)
Glucose, Bld: 105 mg/dL — ABNORMAL HIGH (ref 70–99)
Potassium: 4.4 mmol/L (ref 3.5–5.1)
Sodium: 130 mmol/L — ABNORMAL LOW (ref 135–145)

## 2022-07-15 LAB — BRAIN NATRIURETIC PEPTIDE: B Natriuretic Peptide: 139.5 pg/mL — ABNORMAL HIGH (ref 0.0–100.0)

## 2022-07-15 LAB — MAGNESIUM: Magnesium: 1.7 mg/dL (ref 1.7–2.4)

## 2022-07-15 MED ORDER — LACTATED RINGERS IV SOLN: INTRAVENOUS | Status: DC

## 2022-07-15 MED ORDER — GERHARDT'S BUTT CREAM
TOPICAL_CREAM | Freq: Three times a day (TID) | CUTANEOUS | Status: DC
  Filled 2022-07-15: qty 1

## 2022-07-15 MED ORDER — BENZONATATE 200 MG PO CAPS: 200.0000 mg | ORAL_CAPSULE | Freq: Three times a day (TID) | ORAL | 0 refills | Status: AC | PRN

## 2022-07-15 MED ORDER — FUROSEMIDE 20 MG PO TABS: 20.0000 mg | ORAL_TABLET | Freq: Every day | ORAL | 0 refills | Status: DC

## 2022-07-15 MED ORDER — GUAIFENESIN-DM 100-10 MG/5ML PO SYRP: 10.0000 mL | ORAL_SOLUTION | Freq: Three times a day (TID) | ORAL | 0 refills | Status: DC | PRN

## 2022-07-15 MED ORDER — GUAIFENESIN-DM 100-10 MG/5ML PO SYRP
10.0000 mL | ORAL_SOLUTION | Freq: Three times a day (TID) | ORAL | Status: DC | PRN
  Administered 2022-07-15: 10 mL via ORAL
  Filled 2022-07-15: qty 10

## 2022-07-15 MED ORDER — ACETAMINOPHEN 325 MG PO TABS: 650.0000 mg | ORAL_TABLET | Freq: Four times a day (QID) | ORAL | Status: AC | PRN

## 2022-07-15 NOTE — TOC Transition Note (Signed)
Transition of Care [facility]) - CM/SW Discharge Note   Patient Details  Name: Cassandra Wu MRN: [id number] Date of Birth: 07/30/1925  Transition [facility]) CM/SW Contact:  Benard Halsted, LCSW Phone Number: 07/15/2022, 11:58 AM   Clinical Narrative:    Patient will DC to: [facility] Anticipated DC date: 07/15/22 Family notified: Daughter, Environmental education officer by: Corey Harold   Per MD patient ready for DC to [facility]. RN to call report prior to discharge [phone]). RN, patient, patient's family, and facility notified of DC. Discharge Summary and FL2 sent to facility. DC packet on chart. Ambulance transport requested for patient.   CSW will sign off for now as social work intervention is no longer needed. Please consult [place] again if new needs arise.     Final next level of care: Skilled Nursing Facility Barriers to Discharge: Barriers Resolved   Patient Goals and CMS Choice Patient states their goals for this hospitalization and ongoing recovery are:: Rehab CMS Medicare.gov Compare Post Acute Care list provided to:: Patient Represenative (must comment) Choice offered to / list presented to : Adult Children  Discharge Placement   Existing PASRR number confirmed : 07/15/22          Patient chooses bed at: [place], [place] Patient to be transferred to facility by: [place] Name of family member notified: Daughter Patient and family notified of of transfer: 07/15/22  Discharge Plan and Services In-house Referral: Clinical Social Work   Post Acute Care Choice: [place]                               Social Determinants of Health (SDOH) Interventions     Readmission Risk Interventions     No data to display

## 2022-07-15 NOTE — TOC Progression Note (Signed)
Transition of Care [facility]) - Progression Note    Patient Details  Name: Cassandra Wu MRN: [id number] Date of Birth: Oct 08, 1924  Transition of Care [facility]) CM/SW [place], LCSW Phone Number: 07/15/2022, 8:57 AM  Clinical Narrative:    CSW contacted Navi and confirmed that patient's authorization for [facility] is good until midnight today.    Expected Discharge Plan: [place] Barriers to Discharge: Barriers Resolved  Expected Discharge Plan and Services Expected Discharge Plan: [place] In-house Referral: Clinical Social Work   Post Acute Care Choice: [place] Living arrangements for the past 2 months: Single Family Home Expected Discharge Date: 07/15/22                                     Social Determinants of Health (SDOH) Interventions    Readmission Risk Interventions     No data to display

## 2022-07-15 NOTE — Plan of Care (Signed)
  Problem: Respiratory: Goal: Complications related to the disease process, condition or treatment will be avoided or minimized Outcome: Progressing   Problem: Education: Goal: Knowledge of General Education information will improve Description: Including pain rating scale, medication(s)/side effects and non-pharmacologic comfort measures Outcome: Progressing   Problem: Clinical Measurements: Goal: Cardiovascular complication will be avoided Outcome: Progressing   Problem: Activity: Goal: Risk for activity intolerance will decrease Outcome: Progressing   Problem: Nutrition: Goal: Adequate nutrition will be maintained Outcome: Progressing   Problem: Coping: Goal: Level of anxiety will decrease Outcome: Progressing   Problem: Safety: Goal: Ability to remain free from injury will improve Outcome: Progressing   Problem: Pain Managment: Goal: General experience of comfort will improve Outcome: Progressing

## 2022-07-15 NOTE — Discharge Instructions (Signed)
Follow with Primary MD McAlexander, Magdalene River, FNP in 7 days   Get CBC, BMP, 2 view Chest X ray -  checked next visit with your  SNF MD    Activity: As tolerated with Full fall precautions use walker/cane & assistance as needed  Disposition SNF  Diet: Dysphagia 3 diet with feeding assistance and aspiration precautions.    Special Instructions: If you have smoked or chewed Tobacco  in the last 2 yrs please stop smoking, stop any regular Alcohol  and or any Recreational drug use.  On your next visit with your primary care physician please Get Medicines reviewed and adjusted.  Please request your Prim.MD to go over all Hospital Tests and Procedure/Radiological results at the follow up, please get all Hospital records sent to your Prim MD by signing hospital release before you go home.  If you experience worsening of your admission symptoms, develop shortness of breath, life threatening emergency, suicidal or homicidal thoughts you must seek medical attention immediately by calling 911 or calling your MD immediately  if symptoms less severe.  You Must read complete instructions/literature along with all the possible adverse reactions/side effects for all the Medicines you take and that have been prescribed to you. Take any new Medicines after you have completely understood and accpet all the possible adverse reactions/side effects.

## 2022-07-15 NOTE — Progress Notes (Signed)
Physical Therapy Treatment Patient Details Name: Cassandra Wu MRN: [id number] DOB: 1924-12-15 Today's Date: 07/15/2022   History of Present Illness 88 y.o. female with medical history significant of hypertension, TIA, anemia, diastolic CHF, lymphedema, history of mastectomy, history of breast cancer, history of GI bleed presenting 11/23 with COVID and generalized decline.    PT Comments    Pt received in bed, agreeable to participation in therapy. She required mod assist supine to sit, max assist sit to stand with RW, and max assist lateral scoot transfer bed to recliner. Pt positioned in recliner with feet elevated.    Recommendations for follow up therapy are one component of a multi-disciplinary discharge planning process, led by the attending physician.  Recommendations may be updated based on patient status, additional functional criteria and insurance authorization.  Follow Up Recommendations  Skilled nursing-short term rehab (<3 hours/day) Can patient physically be transported by private vehicle: No   Assistance Recommended at Discharge Frequent or constant Supervision/Assistance  Patient can return home with the following Two people to help with walking and/or transfers;Two people to help with bathing/dressing/bathroom;Assist for transportation;Help with stairs or ramp for entrance;Assistance with cooking/housework   Equipment Recommendations  None recommended by PT    Recommendations for Other Services       Precautions / Restrictions Precautions Precautions: Fall     Mobility  Bed Mobility Overal bed mobility: Needs Assistance Bed Mobility: Supine to Sit     Supine to sit: Mod assist, HOB elevated     General bed mobility comments: increased time, use of bed pad to scoot to EOB    Transfers Overall transfer level: Needs assistance Equipment used: Rolling walker (2 wheels), None Transfers: Sit to/from Stand, Bed to chair/wheelchair/BSC Sit to Stand:  Max assist, From elevated surface          Lateral/Scoot Transfers: Max assist General transfer comment: sit to stand with RW. Pt very retropulsive with weight in heels. Required return to sit EOB and lateral scoot transfer bed to recliner    Ambulation/Gait               General Gait Details: unable   Stairs             Wheelchair Mobility    Modified Rankin (Stroke Patients Only)       Balance Overall balance assessment: Needs assistance Sitting-balance support: Feet supported, No upper extremity supported Sitting balance-Leahy Scale: Fair     Standing balance support: Bilateral upper extremity supported, During functional activity Standing balance-Leahy Scale: Zero Standing balance comment: total reliant on external assist                            Cognition Arousal/Alertness: Awake/alert Behavior During Therapy: WFL for tasks assessed/performed Overall Cognitive Status: Within Functional Limits for tasks assessed                                          Exercises      General Comments General comments (skin integrity, edema, etc.): off covid precautions as of 12/4      Pertinent Vitals/Pain Pain Assessment Pain Assessment: No/denies pain    Home Living                          Prior Function  PT Goals (current goals can now be found in the care plan section) Acute Rehab PT Goals Patient Stated Goal: not stated Progress towards PT goals: Progressing toward goals    Frequency    Min 2X/week      PT Plan Current plan remains appropriate    Co-evaluation              AM-PAC PT "6 Clicks" Mobility   Outcome Measure  Help needed turning from your back to your side while in a flat bed without using bedrails?: A Lot Help needed moving from lying on your back to sitting on the side of a flat bed without using bedrails?: A Lot Help needed moving to and from a bed to a chair  (including a wheelchair)?: Total Help needed standing up from a chair using your arms (e.g., wheelchair or bedside chair)?: Total Help needed to walk in hospital room?: Total Help needed climbing 3-5 steps with a railing? : Total 6 Click Score: 8    End of Session Equipment Utilized During Treatment: Gait belt Activity Tolerance: Patient tolerated treatment well Patient left: in chair;with call bell/phone within reach Nurse Communication: Mobility status PT Visit Diagnosis: Other abnormalities of gait and mobility (R26.89);Muscle weakness (generalized) (M62.81);Other symptoms and signs involving the nervous system (R29.898)     Time: 3578-9784 PT Time Calculation (min) (ACUTE ONLY): 25 min  Charges:  $Therapeutic Activity: 23-37 mins                     Lorrin Goodell, PT  Office # [phone] Pager [phone]    Cassandra Wu 07/15/2022, 8:29 AM

## 2022-07-15 NOTE — Discharge Summary (Signed)
Cassandra Wu EHU:[id number] DOB: 01-03-1925 DOA: 07/03/2022  PCP: Cornelious Bryant, FNP  Admit date: 07/03/2022  Discharge date: 07/15/2022  Admitted From: SNF   Disposition:  SNF   Recommendations for Outpatient Follow-up:   Follow up with PCP in 1-2 weeks  PCP Please obtain BMP/CBC, 2 view CXR in 1week,  (see Discharge instructions)   PCP Please follow up on the following pending results: Check CBC, BMP, two-view chest x-ray in 7 to 10 days   Home Health: None   Equipment/Devices: None  Consultations: None  Discharge Condition: Stable    CODE STATUS: Full    Diet Recommendation: Dysphagia 3 diet with 1.5 L/day fluid restriction    Chief Complaint  Patient presents with   Covid Positive     Brief history of present illness from the day of admission and additional interim summary    88 y.o.  female with history of HTN, chronic HFpEF, history of breast cancer (s/p mastectomy in 1998)-who developed COVID-19 infection a few days prior to this hospitalization-presented to the ED with weakness-she was subsequently admitted to the hospitalist service.  Post admit-patient had a episode of coughing spells following which she vomited and aspirated.  See below for further details.   Significant events: 11/22>>covid +ve at home, started on Paxlovid 11/23>> to ED with generalized weakness/COVID infection-admit to [facility] 11/24>> vomiting after coughing spell-food particles aspirated from airway-suspicion for aspiration pneumonia-started on Unasyn. 11/25>> several vomiting episodes overnight-abdominal x-ray with possible ileus.   Significant studies: 01/02>> echo: EF 78-58%, grade 1 diastolic dysfunction. 11/23>> CXR: No PNA 11/24>> CXR: Chronic interstitial coarsening but no obvious  infiltrates. 11/25>> CXR: No obvious PNA-some chronic interstitial changes. 11/26>> CT abdomen/pelvis: No ileus/no SBO.  Mild left hydronephrosis of uncertain etiology.  Left lung base PNA.   Significant microbiology data: 11/23>> COVID PCR: Positive 11/23>> influenza PCR: Negative                                                                 Hospital Course   Acute hypoxic respiratory failure likely due to aspiration pneumonia Significantly improved-hypoxia resolved-on room air  Has completed Rocephin/Flagyl x 5 days  Tolerating dysphagia 3 diet-SLP following.  Now close to baseline.   COVID-19 infection Completed 5 days of Paxlovid.   CRP downtrending  Since clinically improved-on room air-no indication for steroids.   Vomiting Resolved- etiology-likely from underlying COVID infection.     Elevated D-dimer Due to COVID-19 infection Negative liver extremity venous duplex, no clinical signs or symptoms of PE.  Was on prophylactic Lovenox.  Trend improving.   Hyponatremia.  But slightly dehydrated, acute on chronic hyponatremia improved with hydration and holding of Lasix, holding Lasix for another 2 days when she is going to SNF, kindly look at the start date below.  AKI Mild Likely hemodynamically mediated Resolved   Hypokalemia/hypomagnesemia/ Has resolved-follow electrolytes periodically.   Chronic HFpEF Volume status stable Has some puffy legs/mild edema at baseline Continue furosemide as ordered, fluid restriction 1.5 L/day   HTN BP stable Continue beta-blocker/furosemide as prescribed, monitor blood pressure and BMP closely.      Normocytic anemia Chronic issue Follows with Dr. Lorenso Courier as an outpatient Follow CBC periodically at SNF   Remote history of breast cancer s/p mastectomy 1998-followed by 5 years of tamoxifen -follow-up with PCP.     Discharge diagnosis     Principal Problem:   COVID-19 virus infection Active Problems:   Essential  hypertension   History of TIA (transient ischemic attack)   MASTECTOMY, BILATERAL, HX OF   Anemia   Chronic diastolic (congestive) heart failure (HCC)   Generalized weakness    Discharge instructions    Discharge Instructions     Discharge instructions   Complete by: As directed    Bilateral thigh-high TED stockings at nighttime.    Follow with Primary MD McAlexander, Magdalene River, FNP in 7 days   Get CBC, BMP, 2 view Chest X ray -  checked next visit with your  SNF MD    Activity: As tolerated with Full fall precautions use walker/cane & assistance as needed  Disposition SNF  Diet: Dysphagia 3 diet with feeding assistance and aspiration precautions.  1.5 L fluid restriction per day.    Special Instructions: If you have smoked or chewed Tobacco  in the last 2 yrs please stop smoking, stop any regular Alcohol  and or any Recreational drug use.  On your next visit with your primary care physician please Get Medicines reviewed and adjusted.  Please request your Prim.MD to go over all Hospital Tests and Procedure/Radiological results at the follow up, please get all Hospital records sent to your Prim MD by signing hospital release before you go home.  If you experience worsening of your admission symptoms, develop shortness of breath, life threatening emergency, suicidal or homicidal thoughts you must seek medical attention immediately by calling 911 or calling your MD immediately  if symptoms less severe.  You Must read complete instructions/literature along with all the possible adverse reactions/side effects for all the Medicines you take and that have been prescribed to you. Take any new Medicines after you have completely understood and accpet all the possible adverse reactions/side effects.   Increase activity slowly   Complete by: As directed        Discharge Medications   Allergies as of 07/15/2022       Reactions   Penicillins    Propranolol Hcl          Medication List     STOP taking these medications    ALPRAZolam 0.25 MG tablet Commonly known as: XANAX   nirmatrelvir/ritonavir EUA 20 x 150 MG & 10 x '100MG'$  Tabs Commonly known as: PAXLOVID   valsartan 40 MG tablet Commonly known as: Diovan       TAKE these medications    acetaminophen 325 MG tablet Commonly known as: TYLENOL Take 2 tablets (650 mg total) by mouth every 6 (six) hours as needed for mild pain (or Fever >/= 101).   aspirin EC 81 MG tablet Take 81 mg by mouth daily. Swallow whole.   benzonatate 200 MG capsule Commonly known as: TESSALON Take 1 capsule (200 mg total) by mouth 3 (three) times daily as needed for cough.   ferrous sulfate 325 (65 FE) MG tablet  Take 1 tablet (325 mg total) by mouth 2 (two) times daily with a meal. What changed:  when to take this additional instructions   furosemide 20 MG tablet Commonly known as: Lasix Take 1 tablet (20 mg total) by mouth daily. Start taking on: July 17, 2022 What changed:  See the new instructions. These instructions start on July 17, 2022. If you are unsure what to do until then, ask your doctor or other care provider.   guaiFENesin-dextromethorphan 100-10 MG/5ML syrup Commonly known as: ROBITUSSIN DM Take 10 mLs by mouth every 8 (eight) hours as needed for cough.   ICAPS AREDS 2 PO Take 1 capsule by mouth daily.   metoprolol succinate 25 MG 24 hr tablet Commonly known as: TOPROL-XL Take 25 mg by mouth daily.   pantoprazole 40 MG tablet Commonly known as: PROTONIX Take 40 mg by mouth daily.   polyethylene glycol powder 17 GM/SCOOP powder Commonly known as: MiraLax Take 17 g by mouth 2 (two) times daily as needed for moderate constipation.   potassium chloride 10 MEQ tablet Commonly known as: KLOR-CON Take 10 mEq by mouth daily. Take 40mq daily as needed when taking a dose of lasix   senna-docusate 8.6-50 MG tablet Commonly known as: Senokot-S Take 1 tablet by mouth 2 (two)  times daily between meals as needed for mild constipation.   Voltaren 1 % Gel Generic drug: diclofenac Sodium Apply topically 4 (four) times daily.         Contact information for follow-up providers     McAlexander, LMagdalene River FNP. Schedule an appointment as soon as possible for a visit in 1 week(s).   Specialty: Internal Medicine Contact information: [address] [phone]             Contact information for after-discharge care     Destination     [facility] .   Service: Skilled Nursing Contact information: [address] [phone]                    Major procedures and Radiology Reports - PLEASE review detailed and final reports thoroughly  -     VAS [place]OWER EXTREMITY VENOUS (DVT)  Result Date: 07/14/2022  Lower Venous DVT Study Patient Name:  Cassandra Wu  Date of Exam:   07/14/2022 Medical Rec #: [id number]             Accession #:    [id number] of Birth: 1January 25, 1926            Patient Gender: F Patient Age:   89years [facility]:      VAS [place]OWER EXTREMITY VENOUS (DVT) Referring Phys: PDeno [facility]ienneSH. [facility]--------------------------------------------------------------------------------  Indications: Recent Covid-19, d-dimer.  Comparison Study: No prior studies. Performing Technologist: RDarlin CocoRDMS, RVT  Examination Guidelines: A complete evaluation includes B-mode imaging, spectral Doppler, color Doppler, and power Doppler as needed of all accessible portions of each vessel. Bilateral testing is considered an integral part of a complete examination. Limited examinations for reoccurring indications may be performed as noted. The reflux portion of the exam is performed with the patient in reverse Trendelenburg.  +---------+---------------+---------+-----------+----------+--------------+ RIGHT     CompressibilityPhasicitySpontaneityPropertiesThrombus Aging +---------+---------------+---------+-----------+----------+--------------+ CFV      Full           Yes      Yes                                 +---------+---------------+---------+-----------+----------+--------------+  SFJ      Full                                                        +---------+---------------+---------+-----------+----------+--------------+ FV Prox  Full                                                        +---------+---------------+---------+-----------+----------+--------------+ FV Mid   Full                                                        +---------+---------------+---------+-----------+----------+--------------+ FV DistalFull                                                        +---------+---------------+---------+-----------+----------+--------------+ PFV      Full                                                        +---------+---------------+---------+-----------+----------+--------------+ POP      Full           Yes      Yes                                 +---------+---------------+---------+-----------+----------+--------------+ PTV      Full                                                        +---------+---------------+---------+-----------+----------+--------------+ PERO     Full                                                        +---------+---------------+---------+-----------+----------+--------------+   +---------+---------------+---------+-----------+----------+--------------+ LEFT     CompressibilityPhasicitySpontaneityPropertiesThrombus Aging +---------+---------------+---------+-----------+----------+--------------+ CFV      Full           Yes      Yes                                 +---------+---------------+---------+-----------+----------+--------------+ SFJ      Full                                                         +---------+---------------+---------+-----------+----------+--------------+  FV Prox  Full                                                        +---------+---------------+---------+-----------+----------+--------------+ FV Mid   Full                                                        +---------+---------------+---------+-----------+----------+--------------+ FV DistalFull                                                        +---------+---------------+---------+-----------+----------+--------------+ PFV      Full                                                        +---------+---------------+---------+-----------+----------+--------------+ POP      Full           Yes      Yes                                 +---------+---------------+---------+-----------+----------+--------------+ PTV      Full                                                        +---------+---------------+---------+-----------+----------+--------------+ PERO     Full                                                        +---------+---------------+---------+-----------+----------+--------------+     Summary: RIGHT: - There is no evidence of deep vein thrombosis in the lower extremity.  - No cystic structure found in the popliteal fossa.  LEFT: - There is no evidence of deep vein thrombosis in the lower extremity.  - No cystic structure found in the popliteal fossa.  *See table(s) above for measurements and observations. Electronically signed by Servando Snare MD on 07/14/2022 at 4:39:44 PM.    Final    CT ABDOMEN PELVIS WO CONTRAST  Result Date: 07/06/2022 CLINICAL DATA:  Suspected bowel obstruction EXAM: CT ABDOMEN AND PELVIS WITHOUT CONTRAST TECHNIQUE: Multidetector CT imaging of the abdomen and pelvis was performed following the standard protocol without IV contrast. RADIATION DOSE REDUCTION: This exam was performed according to the departmental dose-optimization  program which includes automated exposure control, adjustment of the mA and/or kV according to patient size and/or use of iterative reconstruction technique. COMPARISON:  CT 12/22/2007 FINDINGS: Lower chest: No pleural or pericardial effusion. Scattered aortic calcifications. Patchy airspace infiltrates in the posteromedial  left lung base. Hepatobiliary: At least 2 partially calcified gallstones of measure up to 1.8 cm in the physiologically distended gallbladder. No focal liver lesion or biliary ductal dilatation evident. Pancreas: Unremarkable. No pancreatic ductal dilatation or surrounding inflammatory changes. Spleen: Normal in size without focal abnormality. Adrenals/Urinary Tract: No nephrolithiasis. There is mild left hydronephrosis and ureterectasis. Urinary bladder is incompletely distended, with a small diverticulum from the dome. Stomach/Bowel: Stomach is partially distended, unremarkable. There is a single loop of small bowel dilated to 5 cm in the right mid abdomen, and other scattered mildly distended loops, but good distal passage of oral contrast material to the colon. The colon is nondilated, unremarkable. Vascular/Lymphatic: Moderate aortoiliac calcified atheromatous plaque without aneurysm. No definite abdominal or pelvic adenopathy. Reproductive: Uterus and bilateral adnexa are unremarkable. Other: Multiple pelvic phleboliths.  No ascites.  No free air. Musculoskeletal: Small amount of subcutaneous gas in the right mid body wall presumably related to subcutaneous injections. 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 Lumbar levoscoliosis with multilevel spondylitic change. Grade 1/2 anterolisthesis L3-4 with spinal stenosis. IMPRESSION: 1. Single dilated loop of small bowel in the right mid abdomen, but good distal passage of oral contrast material to the colon. 2. Mild left hydronephrosis and ureterectasis, of uncertain etiology. No nephrolithiasis. 3. Cholelithiasis. 4. Patchy airspace  infiltrates in the posteromedial left lung base, possibly pneumonia. 5. Lumbar levoscoliosis, spondylitic change, and spinal stenosis L3-4. Aortic Atherosclerosis (ICD10-I70.0). Electronically Signed   By: Lucrezia Europe M.D.   On: 07/06/2022 19:32   DG CHEST PORT 1 VIEW  Result Date: 07/06/2022 CLINICAL DATA:  Nausea and vomiting EXAM: PORTABLE CHEST 1 VIEW COMPARISON:  07/05/2022 FINDINGS: Cardiac shadow is stable. Aortic calcifications are again noted. The lungs are well aerated. No focal infiltrate or effusion is seen. Chronic degenerative changes of the glenohumeral joints are seen. IMPRESSION: No acute abnormality noted.  No change from the previous day. Electronically Signed   By: Inez Catalina M.D.   On: 07/06/2022 01:26   DG Abd Portable 1V  Result Date: 07/06/2022 CLINICAL DATA:  Nausea and vomiting EXAM: PORTABLE ABDOMEN - 1 VIEW COMPARISON:  None Available. FINDINGS: Scattered large and small bowel gas is noted. Dilated loops of small bowel are noted deep within the pelvis. Tortuous abdominal aorta is noted. No free air is seen. No mass lesion is seen. Degenerative changes of lumbar spine are noted with scoliosis concave to the right. IMPRESSION: Dilated loops of small bowel deep in the pelvis. This may represent an ileus or small-bowel obstruction. CT may be helpful for further evaluation. Electronically Signed   By: Inez Catalina M.D.   On: 07/06/2022 00:44   DG Chest Port 1 View  Result Date: 07/05/2022 CLINICAL DATA:  Shortness of breath. EXAM: PORTABLE CHEST 1 VIEW COMPARISON:  July 04, 2022. FINDINGS: Stable cardiomediastinal silhouette. Lungs are clear. Severe degenerative changes seen involving both glenohumeral joints. IMPRESSION: No active disease. Electronically Signed   By: Marijo Conception M.D.   On: 07/05/2022 08:48   DG CHEST PORT 1 VIEW  Result Date: 07/04/2022 CLINICAL DATA:  Hypoxia.  Cough, vomiting and aspiration. EXAM: PORTABLE CHEST 1 VIEW COMPARISON:  07/03/2022  FINDINGS: Stable cardiac enlargement. Aortic tortuosity with atherosclerotic calcifications. No pleural effusion or edema. Coarsened interstitial markings are noted bilaterally, unchanged. No airspace opacities. Severe arthropathic changes are noted involving both glenohumeral joints. IMPRESSION: 1. No acute cardiopulmonary abnormalities. 2. Chronic interstitial coarsening. Electronically Signed   By: Kerby Moors M.D.   On:  07/04/2022 05:08   DG Chest Port 1 View  Result Date: 07/03/2022 CLINICAL DATA:  COVID EXAM: PORTABLE CHEST 1 VIEW COMPARISON:  08/09/2021 FINDINGS: Cardiomegaly. Subtle diffuse interstitial opacity. Severe bilateral glenohumeral arthrosis. IMPRESSION: Cardiomegaly with subtle diffuse interstitial opacity, similar in appearance to prior examination and may reflect edema or chronic interstitial change. No new or focal airspace opacity. Electronically Signed   By: Delanna Ahmadi M.D.   On: 07/03/2022 15:07    Micro Results    No results found for this or any previous visit (from the past 240 hour(s)).  Today   Subjective    Cassandra Wu today has no headache,no chest abdominal pain,no new weakness tingling or numbness, feels much better wants to go home today.    Objective   Blood pressure 129/76, pulse (!) 103, temperature 99.1 F (37.3 C), temperature source Oral, resp. rate 20, height '5\' 1"'$  (1.549 m), weight 71.5 kg, SpO2 97 %.   Intake/Output Summary (Last 24 hours) at 07/15/2022 0835 Last data filed at 07/15/2022 0208 Gross per 24 hour  Intake 1628.74 ml  Output --  Net 1628.74 ml    Exam  Awake Alert, No new F.N deficits,    [place].AT,PERRAL Supple Neck,   Symmetrical Chest wall movement, Good air movement bilaterally, CTAB RRR,No Gallops,   +ve B.Sounds, Abd Soft, Non tender,  1+ chronic leg edema    Data Review   Recent Labs  Lab 07/11/22 0457 07/13/22 0148  WBC 4.4 6.1  HGB 9.7* 8.7*  HCT 29.5* 25.9*  PLT 240 250  MCV 82.4 79.4*  MCH 27.1  26.7  MCHC 32.9 33.6  RDW 14.5 14.5  LYMPHSABS  --  1.1  MONOABS  --  1.0  EOSABS  --  0.0  BASOSABS  --  0.0    Recent Labs  Lab 07/09/22 0806 07/10/22 0657 07/11/22 0457 07/13/22 0148 07/14/22 0758 07/15/22 0505  NA 136  --  134* 130* 129* 130*  K 3.8  --  4.3 4.0 4.2 4.4  CL 98  --  99 92* 92* 94*  CO2 29  --  '28 28 27 28  '$ GLUCOSE 125*  --  106* 114* 108* 105*  BUN 9  --  '14 13 16 16  '$ CREATININE 0.61 0.65 0.68 0.73 0.74 0.65  CRP  --   --   --   --  11.2*  --   DDIMER  --   --   --   --  1.95*  --   BNP  --   --   --  77.4 54.2 139.5*  MG 1.9  --  1.7 1.8  --  1.7  CALCIUM 9.1  --  9.1 9.1 8.9 8.8*     Total Time in preparing paper work, data evaluation and todays exam - 35 minutes  Signature  -    Lala Lund M.D on 07/15/2022 at 8:35 AM   -  To page go to [email or web address]

## 2022-07-18 ENCOUNTER — Other Ambulatory Visit: Payer: Self-pay | Admitting: *Deleted

## 2022-07-18 DIAGNOSIS — U071 COVID-19: Secondary | ICD-10-CM | POA: Diagnosis not present

## 2022-07-18 DIAGNOSIS — I10 Essential (primary) hypertension: Secondary | ICD-10-CM | POA: Diagnosis not present

## 2022-07-18 DIAGNOSIS — I5030 Unspecified diastolic (congestive) heart failure: Secondary | ICD-10-CM | POA: Diagnosis not present

## 2022-07-18 DIAGNOSIS — J690 Pneumonitis due to inhalation of food and vomit: Secondary | ICD-10-CM | POA: Diagnosis not present

## 2022-07-18 NOTE — Patient Outreach (Signed)
Mrs. Stcharles resides in [facility]. Screening for potential [facility] care coordination services as benefit of insurance plan and PCP.  Update received from [place], [place] social worker. Mrs. Labreck recently admitted to SNF. Care plan meeting to be scheduled early next week.   Will continue to follow.   Marthenia Rolling, MSN, RN,BSN [place] Acute Care Coordinator [phone] (Direct dial)

## 2022-07-19 DIAGNOSIS — I5033 Acute on chronic diastolic (congestive) heart failure: Secondary | ICD-10-CM | POA: Diagnosis not present

## 2022-07-21 DIAGNOSIS — R0989 Other specified symptoms and signs involving the circulatory and respiratory systems: Secondary | ICD-10-CM | POA: Diagnosis not present

## 2022-07-28 ENCOUNTER — Other Ambulatory Visit: Payer: Self-pay | Admitting: *Deleted

## 2022-07-28 NOTE — Patient Outreach (Signed)
[place] Coordinator follow up. Mrs. Schachter currently resides in [facility]. Screening for potential [facility] care coordinations services.   Facility site visit to [facility]. Met with Bryson Ha, SNF social worker. Bryson Ha states family called to reschedule care plan meeting. Mrs. Trivett is still using hoyer lift. Insurance has projected dc date of 08/01/22. Bryson Ha to discuss transition plans with family.   Went to Mrs. Grill's room. Mrs. Havlik states she lives alone. States her daughter Doristine Bosworth is primary contact. Left [facility] brochure and contact information.   Will plan outreach to daughter as appropriate. Appears PCP is not in [facility] provider.   Will continue to follow.   Marthenia Rolling, MSN, RN,BSN [place] Acute Care Coordinator [phone] (Direct dial)

## 2022-08-07 ENCOUNTER — Other Ambulatory Visit: Payer: Self-pay | Admitting: *Deleted

## 2022-08-07 NOTE — Patient Outreach (Addendum)
[facility]- Acute Care Coordinator follow up. Verified in [facility] Mrs. Bellina discharged from [facility] on 08/06/22.  Will inquire with SNF social worker to find out home health arrangements.  Addendum: Confirmed with [facility] skilled nursing facility social [place], Mrs. Hanneman transitioned to home in [place], [place] with family and [facility].    Marthenia Rolling, MSN, RN,BSN [place] Acute Care Coordinator [phone] (Direct dial)

## 2023-07-20 ENCOUNTER — Ambulatory Visit: Payer: Self-pay | Admitting: Diagnostic Neuroimaging

## 2023-07-20 ENCOUNTER — Telehealth: Payer: Self-pay | Admitting: Diagnostic Neuroimaging

## 2023-07-20 NOTE — Telephone Encounter (Signed)
Pt called and had to r/s due to getting confused and thinking it was in town.

## 2023-11-09 ENCOUNTER — Ambulatory Visit: Payer: Self-pay | Admitting: Diagnostic Neuroimaging

## 2024-08-13 ENCOUNTER — Encounter (HOSPITAL_COMMUNITY): Payer: Self-pay | Admitting: Internal Medicine

## 2024-08-13 ENCOUNTER — Other Ambulatory Visit: Payer: Self-pay

## 2024-08-13 ENCOUNTER — Inpatient Hospital Stay (HOSPITAL_COMMUNITY)
Admission: EM | Admit: 2024-08-13 | Discharge: 2024-08-19 | DRG: 948 | Disposition: A | Discharge status: Skilled Nursing Facility | Attending: Internal Medicine | Admitting: Internal Medicine

## 2024-08-13 DIAGNOSIS — Z66 Do not resuscitate: Secondary | ICD-10-CM | POA: Diagnosis present

## 2024-08-13 DIAGNOSIS — N179 Acute kidney failure, unspecified: Secondary | ICD-10-CM | POA: Diagnosis present

## 2024-08-13 DIAGNOSIS — R531 Weakness: Secondary | ICD-10-CM | POA: Diagnosis not present

## 2024-08-13 DIAGNOSIS — D649 Anemia, unspecified: Secondary | ICD-10-CM | POA: Diagnosis present

## 2024-08-13 DIAGNOSIS — Y92009 Unspecified place in unspecified non-institutional (private) residence as the place of occurrence of the external cause: Secondary | ICD-10-CM

## 2024-08-13 DIAGNOSIS — D72819 Decreased white blood cell count, unspecified: Secondary | ICD-10-CM | POA: Diagnosis present

## 2024-08-13 DIAGNOSIS — Z7982 Long term (current) use of aspirin: Secondary | ICD-10-CM

## 2024-08-13 DIAGNOSIS — M25542 Pain in joints of left hand: Secondary | ICD-10-CM | POA: Diagnosis present

## 2024-08-13 DIAGNOSIS — M25541 Pain in joints of right hand: Secondary | ICD-10-CM | POA: Diagnosis present

## 2024-08-13 DIAGNOSIS — D709 Neutropenia, unspecified: Secondary | ICD-10-CM | POA: Diagnosis present

## 2024-08-13 DIAGNOSIS — R5383 Other fatigue: Secondary | ICD-10-CM | POA: Diagnosis present

## 2024-08-13 DIAGNOSIS — R110 Nausea: Secondary | ICD-10-CM | POA: Diagnosis present

## 2024-08-13 DIAGNOSIS — F419 Anxiety disorder, unspecified: Secondary | ICD-10-CM | POA: Diagnosis present

## 2024-08-13 DIAGNOSIS — I10 Essential (primary) hypertension: Secondary | ICD-10-CM | POA: Diagnosis present

## 2024-08-13 DIAGNOSIS — N3000 Acute cystitis without hematuria: Principal | ICD-10-CM

## 2024-08-13 DIAGNOSIS — Z79899 Other long term (current) drug therapy: Secondary | ICD-10-CM

## 2024-08-13 DIAGNOSIS — Z87440 Personal history of urinary (tract) infections: Secondary | ICD-10-CM

## 2024-08-13 DIAGNOSIS — Z888 Allergy status to other drugs, medicaments and biological substances status: Secondary | ICD-10-CM

## 2024-08-13 DIAGNOSIS — R300 Dysuria: Secondary | ICD-10-CM | POA: Diagnosis present

## 2024-08-13 DIAGNOSIS — D329 Benign neoplasm of meninges, unspecified: Secondary | ICD-10-CM | POA: Diagnosis present

## 2024-08-13 DIAGNOSIS — Z91014 Allergy to mammalian meats: Secondary | ICD-10-CM | POA: Insufficient documentation

## 2024-08-13 DIAGNOSIS — Z880 Allergy status to penicillin: Secondary | ICD-10-CM

## 2024-08-13 DIAGNOSIS — Z853 Personal history of malignant neoplasm of breast: Secondary | ICD-10-CM

## 2024-08-13 DIAGNOSIS — T368X5A Adverse effect of other systemic antibiotics, initial encounter: Secondary | ICD-10-CM | POA: Diagnosis present

## 2024-08-13 DIAGNOSIS — G319 Degenerative disease of nervous system, unspecified: Secondary | ICD-10-CM | POA: Diagnosis present

## 2024-08-13 DIAGNOSIS — Z9012 Acquired absence of left breast and nipple: Secondary | ICD-10-CM

## 2024-08-13 LAB — COMPREHENSIVE METABOLIC PANEL WITH GFR
ALT: 12 U/L (ref 0–44)
AST: 24 U/L (ref 15–41)
Albumin: 3.8 g/dL (ref 3.5–5.0)
Alkaline Phosphatase: 64 U/L (ref 38–126)
Anion gap: 10 (ref 5–15)
BUN: 18 mg/dL (ref 8–23)
CO2: 26 mmol/L (ref 22–32)
Calcium: 9.7 mg/dL (ref 8.9–10.3)
Chloride: 100 mmol/L (ref 98–111)
Creatinine, Ser: 1.25 mg/dL — ABNORMAL HIGH (ref 0.44–1.00)
GFR, Estimated: 39 mL/min — ABNORMAL LOW
Glucose, Bld: 110 mg/dL — ABNORMAL HIGH (ref 70–99)
Potassium: 4.4 mmol/L (ref 3.5–5.1)
Sodium: 136 mmol/L (ref 135–145)
Total Bilirubin: 0.4 mg/dL (ref 0.0–1.2)
Total Protein: 6.9 g/dL (ref 6.5–8.1)

## 2024-08-13 LAB — URINALYSIS, W/ REFLEX TO CULTURE (INFECTION SUSPECTED)
Bilirubin Urine: NEGATIVE
Glucose, UA: NEGATIVE mg/dL
Hgb urine dipstick: NEGATIVE
Ketones, ur: NEGATIVE mg/dL
Nitrite: NEGATIVE
Protein, ur: NEGATIVE mg/dL
Specific Gravity, Urine: 1.004 — ABNORMAL LOW (ref 1.005–1.030)
WBC, UA: >50 WBC/hpf (ref 0–5)
pH: 7 (ref 5.0–8.0)

## 2024-08-13 LAB — CBC WITH DIFFERENTIAL/PLATELET
Abs Immature Granulocytes: 0.01 K/uL (ref 0.00–0.07)
Basophils Absolute: 0 K/uL (ref 0.0–0.1)
Basophils Relative: 0 %
Eosinophils Absolute: 0 K/uL (ref 0.0–0.5)
Eosinophils Relative: 1 %
HCT: 33.2 % — ABNORMAL LOW (ref 36.0–46.0)
Hemoglobin: 10.8 g/dL — ABNORMAL LOW (ref 12.0–15.0)
Immature Granulocytes: 0 %
Lymphocytes Relative: 45 %
Lymphs Abs: 1.2 K/uL (ref 0.7–4.0)
MCH: 28.1 pg (ref 26.0–34.0)
MCHC: 32.5 g/dL (ref 30.0–36.0)
MCV: 86.5 fL (ref 80.0–100.0)
Monocytes Absolute: 0.5 K/uL (ref 0.1–1.0)
Monocytes Relative: 16 %
Neutro Abs: 1.1 K/uL — ABNORMAL LOW (ref 1.7–7.7)
Neutrophils Relative %: 38 %
Platelets: 180 K/uL (ref 150–400)
RBC: 3.84 MIL/uL — ABNORMAL LOW (ref 3.87–5.11)
RDW: 15.2 % (ref 11.5–15.5)
WBC: 2.8 K/uL — ABNORMAL LOW (ref 4.0–10.5)
nRBC: 0 % (ref 0.0–0.2)

## 2024-08-13 MED ORDER — METOPROLOL SUCCINATE ER 50 MG PO TB24
50.0000 mg | ORAL_TABLET | Freq: Every day | ORAL | Status: DC
  Administered 2024-08-14 – 2024-08-19 (×6): 50 mg via ORAL
  Filled 2024-08-13 (×6): qty 1

## 2024-08-13 MED ORDER — SODIUM CHLORIDE 0.9 % IV SOLN
1.0000 g | INTRAVENOUS | Status: DC
  Administered 2024-08-14: 1 g via INTRAVENOUS
  Filled 2024-08-13: qty 10

## 2024-08-13 MED ORDER — ACETAMINOPHEN 650 MG RE SUPP: 650.0000 mg | Freq: Four times a day (QID) | RECTAL | Status: DC | PRN

## 2024-08-13 MED ORDER — POLYETHYLENE GLYCOL 3350 17 G PO PACK: 17.0000 g | PACK | Freq: Every day | ORAL | Status: DC | PRN

## 2024-08-13 MED ORDER — SODIUM CHLORIDE 0.9 % IV BOLUS
500.0000 mL | Freq: Once | INTRAVENOUS | Status: AC
  Administered 2024-08-13: 500 mL via INTRAVENOUS

## 2024-08-13 MED ORDER — ONDANSETRON HCL 4 MG PO TABS: 4.0000 mg | ORAL_TABLET | Freq: Four times a day (QID) | ORAL | Status: DC | PRN

## 2024-08-13 MED ORDER — ACETAMINOPHEN 325 MG PO TABS
650.0000 mg | ORAL_TABLET | Freq: Four times a day (QID) | ORAL | Status: DC | PRN
  Administered 2024-08-15 – 2024-08-19 (×6): 650 mg via ORAL
  Filled 2024-08-13 (×6): qty 2

## 2024-08-13 MED ORDER — ONDANSETRON HCL 4 MG/2ML IJ SOLN: 4.0000 mg | Freq: Four times a day (QID) | INTRAMUSCULAR | Status: DC | PRN

## 2024-08-13 MED ORDER — HEPARIN SODIUM (PORCINE) 5000 UNIT/ML IJ SOLN
5000.0000 [IU] | Freq: Three times a day (TID) | INTRAMUSCULAR | Status: DC
  Administered 2024-08-13 – 2024-08-14 (×2): 5000 [IU] via SUBCUTANEOUS
  Filled 2024-08-13 (×2): qty 1

## 2024-08-13 MED ORDER — SODIUM CHLORIDE 0.9% FLUSH: 10.0000 mL | INTRAVENOUS | Status: DC | PRN

## 2024-08-13 MED ORDER — LACTATED RINGERS IV SOLN: INTRAVENOUS | Status: AC

## 2024-08-13 MED ORDER — SODIUM CHLORIDE 0.9 % IV SOLN
1.0000 g | Freq: Once | INTRAVENOUS | Status: AC
  Administered 2024-08-13: 1 g via INTRAVENOUS
  Filled 2024-08-13: qty 10

## 2024-08-13 NOTE — H&P (Signed)
 " History and Physical    Cassandra Wu FMW:[id number] DOB: 1924/09/03 DOA: 08/13/2024  PCP: Harlie Clint SQUIBB, FNP  Patient coming from: Home  I have personally briefly reviewed patient's old medical records in [facility]  Chief Complaint: Generalized weakness  HPI: Cassandra Wu is a 89 y.o. female with medical history significant for HTN, recurrent UTI, breast cancer s/p left mastectomy who presented to the ED for evaluation of generalized weakness.  Patient states that she has been dealing with a UTI over the last 4 weeks.  She has had 3 different rounds of antibiotics with Keflex, nitrofurantoin, and most recently Bactrim.  She says about 4 days ago she started feeling generally weak, more so in her legs bilaterally.  She is now ambulatory with use of a rollator walker but has had to utilize a wheelchair for transport.  She says she lives alone but has caretakers and family that help with her care.  She has not fallen or lost consciousness.  She has had some continued dysuria and suprapubic discomfort.  She felt nauseous earlier but did not have any vomiting.  She says appetite has been low and she has not been eating much these last few days.  ED Course  Labs/Imaging on admission: I have personally reviewed following labs and imaging studies.  Initial vitals showed BP 147/100, pulse 93, RR 18, temp 98.4 F, SpO2 99% on room air.  Labs showed WBC 2.8, ANC 1.1, hemoglobin 10.8, platelets 184, sodium 136, potassium 4.4, bicarb 26, BUN 18, creatinine 1.25, serum glucose 110, LFTs within normal limits.  Urinalysis showed negative nitrates, large leukocytes, 0-5 RBCs, >50 WBCs, rare bacteria.  Patient was given 500 cc normal saline and IV ceftriaxone .  The hospitalist service was consulted for admission.  Review of Systems: All systems reviewed and are negative except as documented in history of present illness above.   Past Medical History:  Diagnosis Date    Breast cancer [facility])    Carpal tunnel syndrome    Hypertension     Past Surgical History:  Procedure Laterality Date   BIOPSY  08/13/2021   Procedure: BIOPSY;  Surgeon: Abran Norleen SAILOR, MD;  Location: [facility] ENDOSCOPY;  Service: Endoscopy;;   ESOPHAGOGASTRODUODENOSCOPY (EGD) WITH PROPOFOL  N/A 08/13/2021   Procedure: ESOPHAGOGASTRODUODENOSCOPY (EGD) WITH PROPOFOL ;  Surgeon: Abran Norleen SAILOR, MD;  Location: [facility] ENDOSCOPY;  Service: Endoscopy;  Laterality: N/A;   left breast mastectomy (other)     TONSILLECTOMY      Social History: Social History[1]  Allergies[2]  No family history on file.   Prior to Admission medications  Medication Sig Start Date End Date Taking? Authorizing Provider  acetaminophen  (TYLENOL ) 325 MG tablet Take 2 tablets (650 mg total) by mouth every 6 (six) hours as needed for mild pain (or Fever >/= 101). 07/15/22   Singh, Prashant K, MD  aspirin  EC 81 MG tablet Take 81 mg by mouth daily. Swallow whole.    [provider]  benzonatate  (TESSALON ) 200 MG capsule Take 1 capsule (200 mg total) by mouth 3 (three) times daily as needed for cough. 07/15/22   Singh, Prashant K, MD  Cholecalciferol 25 MCG (1000 UT) capsule Take 1,000 Units by mouth daily.    [provider]  diclofenac Sodium (VOLTAREN) 1 % GEL Apply topically 4 (four) times daily.    [provider]  docusate sodium  (COLACE) 100 MG capsule Take 100 mg by mouth 2 (two) times daily.    [provider]  ferrous sulfate  325 (65 FE) MG tablet Take 1 tablet (325 mg total) by mouth 2 (two) times daily with a meal. Patient taking differently: Take 325 mg by mouth See admin instructions. M,W,F 08/14/21   Gonfa, Taye T, MD  furosemide  (LASIX ) 20 MG tablet Take 1 tablet (20 mg total) by mouth daily. 07/17/22 08/16/22  Dennise Lavada POUR, MD  guaiFENesin -dextromethorphan  (ROBITUSSIN DM) 100-10 MG/5ML syrup Take 10 mLs by mouth every 8 (eight) hours as needed for cough. 07/15/22   Singh, Prashant K, MD   hydrOXYzine (ATARAX) 10 MG tablet Take 10 mg by mouth in the morning and at bedtime.    [provider]  metoprolol  succinate (TOPROL -XL) 25 MG 24 hr tablet Take 25 mg by mouth daily. 07/02/21   [provider]  Multiple Vitamins-Minerals (ICAPS AREDS 2 PO) Take 1 capsule by mouth daily.    [provider]  nystatin (MYCOSTATIN/NYSTOP) powder Apply 1 Application topically 3 (three) times daily.    [provider]  nystatin cream (MYCOSTATIN) Apply 1 Application topically 2 (two) times daily.    [provider]  pantoprazole  (PROTONIX ) 40 MG tablet Take 40 mg by mouth daily. 05/28/22   [provider]  polyethylene glycol powder (MIRALAX ) 17 GM/SCOOP powder Take 17 g by mouth 2 (two) times daily as needed for moderate constipation. 08/14/21   Gonfa, Taye T, MD  potassium chloride  (KLOR-CON ) 10 MEQ tablet Take 10 mEq by mouth daily. Take 10mEq daily as needed when taking a dose of lasix  05/31/21   [provider]  senna-docusate (SENOKOT-S) 8.6-50 MG tablet Take 1 tablet by mouth 2 (two) times daily between meals as needed for mild constipation. 08/14/21   Gonfa, Taye T, MD  gabapentin (NEURONTIN) 300 MG capsule Take 300 mg by mouth 3 (three) times daily.    03/12/19  [provider]    Physical Exam: Vitals:   08/13/24 1317 08/13/24 1318 08/13/24 1913 08/13/24 2052  BP:  (!) 147/100 (!) 145/82 125/68  Pulse:  93 80 81  Resp:  18 15 15   Temp:  98.4 F (36.9 C) 98.6 F (37 C) 98.3 F (36.8 C)  TempSrc:  Oral  Oral  SpO2:  99% 99% 98%  Weight: 68.9 kg     Height: 5' (1.524 m)      Constitutional: Elderly woman resting supine in bed.  NAD, calm, comfortable Eyes: EOMI, lids and conjunctivae normal ENMT: Mucous membranes are moist. Posterior pharynx clear of any exudate or lesions.Normal dentition.  Neck: normal, supple, no masses. Respiratory: clear to auscultation bilaterally, no wheezing, no crackles. Normal respiratory  effort. No accessory muscle use.  Cardiovascular: Regular rate and rhythm, systolic murmur. No extremity edema. 2+ pedal pulses. Abdomen: no tenderness, no masses palpated. Musculoskeletal: no clubbing / cyanosis. No joint deformity upper and lower extremities. Good ROM, no contractures. Normal muscle tone.  Osteoarthritic changes of the hands. Skin: no rashes, lesions, ulcers. No induration Neurologic: Sensation intact. Strength 5/5 in all 4 while lying in bed.  Psychiatric: Normal judgment and insight. Alert and oriented x 3. Normal mood.   EKG: Personally reviewed. Sinus rhythm, rate 87, no acute ischemic changes.  Assessment/Plan Principal Problem:   Generalized weakness Active Problems:   Acute cystitis without hematuria   Essential hypertension   Leukopenia   AKI (acute kidney injury)   Cassandra Wu is a 89 y.o. female with medical history significant for HTN, recurrent UTI, breast cancer s/p left mastectomy who is admitted with generalized  weakness in setting of UTI failing outpatient antibiotics.  Assessment and Plan: Generalized weakness in setting of UTI: Not improved despite several courses of outpatient antibiotics.  She does not have any focal deficits to suggest acute CVA.  Leukopenia/neutropenia noted. - Continue IV ceftriaxone  - Follow urine culture - Repeat CBC with differential in a.m. - PT/OT eval, fall precautions  Acute kidney injury: Presumed mild AKI with creatinine 1.25 on admission compared to previous baseline 0.6-0.7 in 2023.  Has had recent course of Bactrim. - Gentle IV fluid hydration overnight - Hold Lasix  and metolazone  Hypertension: Continue Toprol -XL.  Holding Lasix  and metolazone.   DVT prophylaxis: heparin  injection 5,000 Units Start: 08/13/24 2200 Code Status:   Code Status: Do not attempt resuscitation (DNR) PRE-ARREST INTERVENTIONS DESIRED discussed with patient on admission. Family Communication: Discussed with patient, she has  discussed with family Disposition Plan: From home, dispo pending clinical progress Consults called: None Severity of Illness: The appropriate patient status for this patient is OBSERVATION. Observation status is judged to be reasonable and necessary in order to provide the required intensity of service to ensure the patient's safety. The patient's presenting symptoms, physical exam findings, and initial radiographic and laboratory data in the context of their medical condition is felt to place them at decreased risk for further clinical deterioration. Furthermore, it is anticipated that the patient will be medically stable for discharge from the hospital within 2 midnights of admission.   Jorie Blanch MD [facility]  If 7PM-7AM, please contact night-coverage [email or web address]  08/13/2024, 8:58 PM      [1]  Social History Tobacco Use   Smoking status: Never   Smokeless tobacco: Never  Substance Use Topics   Alcohol use: No  [2]  Allergies Allergen Reactions   Penicillins    Propranolol Hcl    "

## 2024-08-13 NOTE — ED Triage Notes (Addendum)
 Patient reports she recently had UTI, just finished sulfa antibiotics but says that she got weak after starting antibiotics and now cannot walk Denies pain in triage  Daughter present and says patient has been on three antibiotics for this UTI Patient has not walked for 2 days per daughter and was previously able to walk with walker  Daughter denies any neurological symptoms

## 2024-08-13 NOTE — Hospital Course (Addendum)
 89yo with h/o HTN, recurrent UTI, and breast cancer s/p left mastectomy who presented on 1/3 with generalized weakness in setting of recurrent UTIs.  No current UTI but she has residual weakness and appears to need STR. Assessment and plan. Generalized weakness. Patient is a poor historian therefore unable to get the specifics but patient reports that at her baseline she has been fairly independent but for last 1 year she is progressively getting weak and tired. To the point that right now she is requiring 2+ assist to get out of the bed. No focal deficit on my examination. No evidence of active infection. Basic metabolic workup TSH 2.4, B12 1169, folic acid more than 20.  CRP less than 0.5 And free T4 1.26.  All WNL. Presentation could be related to her recent use of Bactrim as well.   AKI. Baseline creatinine 0.6. Creatinine on admission 1.25. Treated with IV hydration Lasix  held Zaroxolyn held. Creatinine back to 0.83.   Chronic anemia. Hemoglobin at baseline appears to be around 10. Hemoglobin currently 9.5. Stable.  No active bleeding.  Monitor.   Bilateral lower extremity edema. Right more than left. She reports that this has been ongoing recently.  No calf tenderness. Lower EXTR Doppler is negative for DVT. Echocardiogram shows EF 55%.  No significant wall motion abnormality or valvular abnormality.   Anxiety. Appears to be on Xanax . Monitor.   HTN. On Toprol -XL.  Continue. Started on Norvasc  due to uncontrolled hypertension. Will add low-dose HCTZ and monitor..   Meningioma. Known 2.3 cm meningioma and cerebral atrophy.  Based on MRI 2020. Monitor.   Pork allergy. On Eliquis  for DVT prophylaxis.

## 2024-08-13 NOTE — ED Provider Notes (Signed)
 " [place] EMERGENCY DEPARTMENT AT [facility] Provider Note   CSN: [id number] Arrival date & time: 08/13/24  1307     Patient presents with: Weakness   Cassandra Wu is a 89 y.o. female.   Patient is a 89 year old female with past medical history of CHF and recurrent UTI presenting to the emergency department with weakness.  Patient is here with her daughter who helps provide history.  She states that she has had a recurrent UTI over the last 4 weeks and has been on 3 different antibiotics.  She last finished Bactrim this morning.  She reports however over the last 2 days she has had increasing weakness in her bilateral lower extremities.  She states that she normally uses a walker to get around but has been unable to get up with her walker and caregivers have been having to use a wheelchair to get her around.  She denies any numbness or tingling.  She states that she is still having some dysuria and mucousy appearing urine, denies any hematuria.  States that she was having some mild right sided abdominal pain.  She denies any fevers, nausea or vomiting but does report decreased appetite.  The history is provided by the patient and a relative.  Weakness      Prior to Admission medications  Medication Sig Start Date End Date Taking? Authorizing Provider  acetaminophen  (TYLENOL ) 325 MG tablet Take 2 tablets (650 mg total) by mouth every 6 (six) hours as needed for mild pain (or Fever >/= 101). 07/15/22   Singh, Prashant K, MD  aspirin  EC 81 MG tablet Take 81 mg by mouth daily. Swallow whole.    [provider]  benzonatate  (TESSALON ) 200 MG capsule Take 1 capsule (200 mg total) by mouth 3 (three) times daily as needed for cough. 07/15/22   Singh, Prashant K, MD  Cholecalciferol 25 MCG (1000 UT) capsule Take 1,000 Units by mouth daily.    [provider]  diclofenac Sodium (VOLTAREN) 1 % GEL Apply topically 4 (four) times daily.    [provider]   docusate sodium  (COLACE) 100 MG capsule Take 100 mg by mouth 2 (two) times daily.    [provider]  ferrous sulfate  325 (65 FE) MG tablet Take 1 tablet (325 mg total) by mouth 2 (two) times daily with a meal. Patient taking differently: Take 325 mg by mouth See admin instructions. M,W,F 08/14/21   Gonfa, Taye T, MD  furosemide  (LASIX ) 20 MG tablet Take 1 tablet (20 mg total) by mouth daily. 07/17/22 08/16/22  Dennise Lavada POUR, MD  guaiFENesin -dextromethorphan  (ROBITUSSIN DM) 100-10 MG/5ML syrup Take 10 mLs by mouth every 8 (eight) hours as needed for cough. 07/15/22   Singh, Prashant K, MD  hydrOXYzine (ATARAX) 10 MG tablet Take 10 mg by mouth in the morning and at bedtime.    [provider]  metoprolol  succinate (TOPROL -XL) 25 MG 24 hr tablet Take 25 mg by mouth daily. 07/02/21   [provider]  Multiple Vitamins-Minerals (ICAPS AREDS 2 PO) Take 1 capsule by mouth daily.    [provider]  nystatin (MYCOSTATIN/NYSTOP) powder Apply 1 Application topically 3 (three) times daily.    [provider]  nystatin cream (MYCOSTATIN) Apply 1 Application topically 2 (two) times daily.    [provider]  pantoprazole  (PROTONIX ) 40 MG tablet Take 40 mg by mouth daily. 05/28/22   [provider]  polyethylene glycol powder (MIRALAX ) 17 GM/SCOOP powder Take 17  g by mouth 2 (two) times daily as needed for moderate constipation. 08/14/21   Gonfa, Taye T, MD  potassium chloride  (KLOR-CON ) 10 MEQ tablet Take 10 mEq by mouth daily. Take 10mEq daily as needed when taking a dose of lasix  05/31/21   [provider]  senna-docusate (SENOKOT-S) 8.6-50 MG tablet Take 1 tablet by mouth 2 (two) times daily between meals as needed for mild constipation. 08/14/21   Gonfa, Taye T, MD  gabapentin (NEURONTIN) 300 MG capsule Take 300 mg by mouth 3 (three) times daily.    03/12/19  [provider]    Allergies: Penicillins and Propranolol hcl    Review  of Systems  Neurological:  Positive for weakness.    Updated Vital Signs BP (!) 147/100 (BP Location: Right Arm)   Pulse 93   Temp 98.4 F (36.9 C) (Oral)   Resp 18   Ht 5' (1.524 m)   Wt 68.9 kg   SpO2 99%   BMI 29.69 kg/m   Physical Exam Vitals and nursing note reviewed.  Constitutional:      General: She is not in acute distress.    Appearance: Normal appearance.  HENT:     Head: Normocephalic and atraumatic.     Nose: Nose normal.     Mouth/Throat:     Mouth: Mucous membranes are moist.     Pharynx: Oropharynx is clear.  Eyes:     Extraocular Movements: Extraocular movements intact.     Conjunctiva/sclera: Conjunctivae normal.  Cardiovascular:     Rate and Rhythm: Normal rate and regular rhythm.     Heart sounds: Normal heart sounds.  Pulmonary:     Effort: Pulmonary effort is normal.     Breath sounds: Normal breath sounds.  Abdominal:     General: Abdomen is flat.     Palpations: Abdomen is soft.     Tenderness: There is abdominal tenderness (Mild RLQ). There is no right CVA tenderness, left CVA tenderness, guarding or rebound.  Musculoskeletal:        General: Normal range of motion.     Cervical back: Normal range of motion.     Comments: No midline back tenderness, no bony tenderness to bilateral LE  Skin:    General: Skin is warm and dry.  Neurological:     Mental Status: She is alert and oriented to person, place, and time.     Sensory: No sensory deficit.     Comments: 4/5 strength in bilateral hip flexion, 5/5 strength in bilateral plantar/dorsiflexion  Psychiatric:        Mood and Affect: Mood normal.        Behavior: Behavior normal.     (all labs ordered are listed, but only abnormal results are displayed) Labs Reviewed  COMPREHENSIVE METABOLIC PANEL WITH GFR - Abnormal; Notable for the following components:      Result Value   Glucose, Bld 110 (*)    Creatinine, Ser 1.25 (*)    GFR, Estimated 39 (*)    All other components within normal  limits  CBC WITH DIFFERENTIAL/PLATELET - Abnormal; Notable for the following components:   WBC 2.8 (*)    RBC 3.84 (*)    Hemoglobin 10.8 (*)    HCT 33.2 (*)    Neutro Abs 1.1 (*)    All other components within normal limits  URINALYSIS, W/ REFLEX TO CULTURE (INFECTION SUSPECTED) - Abnormal; Notable for the following components:   Color, Urine STRAW (*)    Specific Gravity,  Urine 1.004 (*)    Leukocytes,Ua LARGE (*)    Bacteria, UA RARE (*)    All other components within normal limits  URINE CULTURE    EKG: EKG Interpretation Date/Time:  Saturday August 13 2024 15:10:24 EST Ventricular Rate:  87 PR Interval:  198 QRS Duration:  74 QT Interval:  378 QTC Calculation: 454 R Axis:   12  Text Interpretation: Normal sinus rhythm Normal ECG No significant change since last tracing Confirmed by Ellouise Fine ([id number]) on 08/13/2024 3:20:15 PM  Radiology: No results found.   Procedures   Medications Ordered in the ED  cefTRIAXone  (ROCEPHIN ) 1 g in sodium chloride  0.9 % 100 mL IVPB (has no administration in time range)  sodium chloride  0.9 % bolus 500 mL (500 mLs Intravenous New Bag/Given 08/13/24 1632)    Clinical Course as of 08/13/24 1829  Sat Aug 13, 2024  1600 Mild neutropenia from baseline, Hgb at baseline. Slight increased Cr. Will give fluids. UA pending.  [VK]  1748 UA still appears to have UTI with symptoms. With worsening weakness and failure of outpatient antibiotics will be recommended admission.  [VK]    Clinical Course User Index [VK] Kingsley, Wen Merced K, DO                                 Medical Decision Making This patient presents to the ED with chief complaint(s) of weakness with pertinent past medical history of CHF, recurrent UTI which further complicates the presenting complaint. The complaint involves an extensive differential diagnosis and also carries with it a high risk of complications and morbidity.    The differential diagnosis includes  UTI/infection, dehydration, electrolyte abnormality, anemia, arrhythmia, no focal neurologic deficits making CVA or TIA unlikely, no back pain making cauda equina or other spinal cord etiology unlikely  Additional history obtained: Additional history obtained from family Records reviewed N/A  ED Course and Reassessment: On patient's arrival she is hemodynamically stable in no acute distress, does have some weakness with hip flexion but strength is intact with plantar/dorsi flexion, the patient will have labs and urinalysis and EKG and will be closely reassessed.  Independent labs interpretation:  The following labs were independently interpreted: UA positive for UTI, labs otherwise within normal range  Independent visualization of imaging: - N/A  Consultation: - Consulted or discussed management/test interpretation w/ external professional: hospitalist  Consideration for admission or further workup: patient requires admission for weakness in the setting of UTI with failure of outpatient antibiotics Social Determinants of health: N/A    Amount and/or Complexity of Data Reviewed Labs: ordered.  Risk Decision regarding hospitalization.       Final diagnoses:  Acute cystitis without hematuria  Weakness    ED Discharge Orders     None          Ellouise Fine POUR, DO 08/13/24 1829  "

## 2024-08-14 ENCOUNTER — Encounter (HOSPITAL_COMMUNITY): Payer: Self-pay | Admitting: Internal Medicine

## 2024-08-14 DIAGNOSIS — Z91014 Allergy to mammalian meats: Secondary | ICD-10-CM | POA: Insufficient documentation

## 2024-08-14 DIAGNOSIS — Z7982 Long term (current) use of aspirin: Secondary | ICD-10-CM | POA: Diagnosis not present

## 2024-08-14 DIAGNOSIS — T368X5A Adverse effect of other systemic antibiotics, initial encounter: Secondary | ICD-10-CM | POA: Diagnosis present

## 2024-08-14 DIAGNOSIS — M25541 Pain in joints of right hand: Secondary | ICD-10-CM | POA: Diagnosis present

## 2024-08-14 DIAGNOSIS — Z9012 Acquired absence of left breast and nipple: Secondary | ICD-10-CM | POA: Diagnosis not present

## 2024-08-14 DIAGNOSIS — Z853 Personal history of malignant neoplasm of breast: Secondary | ICD-10-CM | POA: Diagnosis not present

## 2024-08-14 DIAGNOSIS — R300 Dysuria: Secondary | ICD-10-CM | POA: Diagnosis present

## 2024-08-14 DIAGNOSIS — G319 Degenerative disease of nervous system, unspecified: Secondary | ICD-10-CM | POA: Diagnosis present

## 2024-08-14 DIAGNOSIS — R531 Weakness: Secondary | ICD-10-CM | POA: Diagnosis present

## 2024-08-14 DIAGNOSIS — D709 Neutropenia, unspecified: Secondary | ICD-10-CM | POA: Diagnosis present

## 2024-08-14 DIAGNOSIS — F419 Anxiety disorder, unspecified: Secondary | ICD-10-CM | POA: Diagnosis present

## 2024-08-14 DIAGNOSIS — Z888 Allergy status to other drugs, medicaments and biological substances status: Secondary | ICD-10-CM | POA: Diagnosis not present

## 2024-08-14 DIAGNOSIS — R110 Nausea: Secondary | ICD-10-CM | POA: Diagnosis present

## 2024-08-14 DIAGNOSIS — D329 Benign neoplasm of meninges, unspecified: Secondary | ICD-10-CM | POA: Diagnosis present

## 2024-08-14 DIAGNOSIS — D649 Anemia, unspecified: Secondary | ICD-10-CM | POA: Diagnosis present

## 2024-08-14 DIAGNOSIS — R0609 Other forms of dyspnea: Secondary | ICD-10-CM | POA: Diagnosis not present

## 2024-08-14 DIAGNOSIS — I10 Essential (primary) hypertension: Secondary | ICD-10-CM | POA: Diagnosis present

## 2024-08-14 DIAGNOSIS — R5383 Other fatigue: Secondary | ICD-10-CM | POA: Diagnosis present

## 2024-08-14 DIAGNOSIS — Z66 Do not resuscitate: Secondary | ICD-10-CM | POA: Diagnosis present

## 2024-08-14 DIAGNOSIS — Z880 Allergy status to penicillin: Secondary | ICD-10-CM | POA: Diagnosis not present

## 2024-08-14 DIAGNOSIS — Y92009 Unspecified place in unspecified non-institutional (private) residence as the place of occurrence of the external cause: Secondary | ICD-10-CM | POA: Diagnosis not present

## 2024-08-14 DIAGNOSIS — Z79899 Other long term (current) drug therapy: Secondary | ICD-10-CM | POA: Diagnosis not present

## 2024-08-14 DIAGNOSIS — R609 Edema, unspecified: Secondary | ICD-10-CM | POA: Diagnosis not present

## 2024-08-14 DIAGNOSIS — M25542 Pain in joints of left hand: Secondary | ICD-10-CM | POA: Diagnosis present

## 2024-08-14 DIAGNOSIS — Z87440 Personal history of urinary (tract) infections: Secondary | ICD-10-CM | POA: Diagnosis not present

## 2024-08-14 DIAGNOSIS — N179 Acute kidney failure, unspecified: Secondary | ICD-10-CM | POA: Diagnosis present

## 2024-08-14 LAB — CBC WITH DIFFERENTIAL/PLATELET
Abs Immature Granulocytes: 0 K/uL (ref 0.00–0.07)
Basophils Absolute: 0 K/uL (ref 0.0–0.1)
Basophils Relative: 0 %
Eosinophils Absolute: 0.1 K/uL (ref 0.0–0.5)
Eosinophils Relative: 2 %
HCT: 29.5 % — ABNORMAL LOW (ref 36.0–46.0)
Hemoglobin: 9.7 g/dL — ABNORMAL LOW (ref 12.0–15.0)
Immature Granulocytes: 0 %
Lymphocytes Relative: 51 %
Lymphs Abs: 1.3 K/uL (ref 0.7–4.0)
MCH: 28.6 pg (ref 26.0–34.0)
MCHC: 32.9 g/dL (ref 30.0–36.0)
MCV: 87 fL (ref 80.0–100.0)
Monocytes Absolute: 0.4 K/uL (ref 0.1–1.0)
Monocytes Relative: 16 %
Neutro Abs: 0.8 K/uL — ABNORMAL LOW (ref 1.7–7.7)
Neutrophils Relative %: 31 %
Platelets: 171 K/uL (ref 150–400)
RBC: 3.39 MIL/uL — ABNORMAL LOW (ref 3.87–5.11)
RDW: 15 % (ref 11.5–15.5)
WBC: 2.7 K/uL — ABNORMAL LOW (ref 4.0–10.5)
nRBC: 0.7 % — ABNORMAL HIGH (ref 0.0–0.2)

## 2024-08-14 LAB — BASIC METABOLIC PANEL WITH GFR
Anion gap: 8 (ref 5–15)
BUN: 18 mg/dL (ref 8–23)
CO2: 27 mmol/L (ref 22–32)
Calcium: 9.4 mg/dL (ref 8.9–10.3)
Chloride: 103 mmol/L (ref 98–111)
Creatinine, Ser: 1.16 mg/dL — ABNORMAL HIGH (ref 0.44–1.00)
GFR, Estimated: 43 mL/min — ABNORMAL LOW
Glucose, Bld: 95 mg/dL (ref 70–99)
Potassium: 4.2 mmol/L (ref 3.5–5.1)
Sodium: 138 mmol/L (ref 135–145)

## 2024-08-14 MED ORDER — APIXABAN 2.5 MG PO TABS
2.5000 mg | ORAL_TABLET | Freq: Two times a day (BID) | ORAL | Status: DC
  Administered 2024-08-14 – 2024-08-19 (×11): 2.5 mg via ORAL
  Filled 2024-08-14 (×11): qty 1

## 2024-08-14 MED ORDER — SACCHAROMYCES BOULARDII 250 MG PO CAPS
250.0000 mg | ORAL_CAPSULE | Freq: Two times a day (BID) | ORAL | Status: DC
  Administered 2024-08-14 – 2024-08-19 (×11): 250 mg via ORAL
  Filled 2024-08-14 (×11): qty 1

## 2024-08-14 MED ORDER — ALPRAZOLAM 0.5 MG PO TABS
0.5000 mg | ORAL_TABLET | Freq: Every day | ORAL | Status: DC | PRN
  Administered 2024-08-15 – 2024-08-19 (×2): 0.5 mg via ORAL
  Filled 2024-08-14 (×2): qty 1

## 2024-08-14 MED ORDER — SODIUM CHLORIDE 0.9 % IV SOLN: INTRAVENOUS | Status: DC

## 2024-08-14 MED ORDER — SALINE SPRAY 0.65 % NA SOLN: 1.0000 | NASAL | Status: DC | PRN

## 2024-08-14 MED ORDER — FLUTICASONE PROPIONATE 50 MCG/ACT NA SUSP
1.0000 | Freq: Every day | NASAL | Status: DC
  Administered 2024-08-14 – 2024-08-15 (×2): 1 via NASAL
  Filled 2024-08-14: qty 16

## 2024-08-14 MED ORDER — ASPIRIN 81 MG PO TBEC
81.0000 mg | DELAYED_RELEASE_TABLET | Freq: Every day | ORAL | Status: DC
  Administered 2024-08-14 – 2024-08-19 (×6): 81 mg via ORAL
  Filled 2024-08-14 (×6): qty 1

## 2024-08-14 NOTE — Evaluation (Signed)
 Physical Therapy Evaluation Patient Details Name: Cassandra Wu MRN: [id number] DOB: 01/20/1925 Today's Date: 08/14/2024  History of Present Illness  89 y.o. female with medical history significant for HTN, recurrent UTI, breast cancer s/p left mastectomy who presented to the ED for evaluation of generalized weakness.  Clinical Impression  Pt admitted with above diagnosis.  Pt typically ambulates with rollator or RW. Has 24hr care at home. Amb ~ 33' with +2 min assist, +2 mod assist for STS transfers today. Recommend HHPT  Pt currently with functional limitations due to the deficits listed below (see PT Problem List). Pt will benefit from acute skilled PT to increase their independence and safety with mobility to allow discharge.           If plan is discharge home, recommend the following: A lot of help with bathing/dressing/bathroom;A lot of help with walking and/or transfers;Help with stairs or ramp for entrance;Assist for transportation;Assistance with cooking/housework   Can travel by private vehicle        Equipment Recommendations None recommended by PT  Recommendations for Other Services       Functional Status Assessment Patient has had a recent decline in their functional status and demonstrates the ability to make significant improvements in function in a reasonable and predictable amount of time.     Precautions / Restrictions Precautions Precautions: Fall Restrictions Weight Bearing Restrictions Per Provider Order: No      Mobility  Bed Mobility Overal bed mobility: Needs Assistance Bed Mobility: Supine to Sit     Supine to sit: Mod assist, +2 for physical assistance, +2 for safety/equipment     General bed mobility comments: assist to elevate trunk and progress LEs off bed, eventual +2 assist to scoot to EOB largely d/t linens and inability to slide on surface    Transfers Overall transfer level: Needs assistance Equipment used: Rolling walker (2  wheels) Transfers: Sit to/from Stand Sit to Stand: Mod assist, +2 physical assistance, +2 safety/equipment           General transfer comment: STS x3 from EOB. +2 mod assist to rise transition to RW, posterior bias initially, improved with subsequent  trials    Ambulation/Gait Ambulation/Gait assistance: Min assist, +2 physical assistance, +2 safety/equipment Gait Distance (Feet): 14 Feet Assistive device: Rolling walker (2 wheels) Gait Pattern/deviations: Decreased step length - right, Decreased step length - left, Trunk flexed Gait velocity: decr     General Gait Details: cues for trunk and hip extension,  for RW proximity. assist to steady and manage RW  Stairs            Wheelchair Mobility     Tilt Bed    Modified Rankin (Stroke Patients Only)       Balance Overall balance assessment: Needs assistance Sitting-balance support: Feet supported, Single extremity supported Sitting balance-Leahy Scale: Poor Sitting balance - Comments: poor to fair, progress to static sit without support after ~ 3 minutes Postural control: Posterior lean Standing balance support: Reliant on assistive device for balance, During functional activity, Bilateral upper extremity supported Standing balance-Leahy Scale: Poor Standing balance comment: reliant on device and external assist                             Pertinent Vitals/Pain Pain Assessment Pain Assessment: Faces Faces Pain Scale: Hurts a little bit Pain Location: diffuse Pain Descriptors / Indicators: Grimacing Pain Intervention(s): Limited activity within patient's tolerance, Monitored during session, Repositioned  Home Living Family/patient expects to be discharged to:: Private residence Living Arrangements: Alone Available Help at Discharge: Available 24 hours/day;Personal care attendant;Family Type of Home: House Home Access: Ramped entrance       Home Layout: One level Home Equipment: Clinical Biochemist (2 wheels);Rollator (4 wheels);Tub bench;Hand held shower head;Grab bars - tub/shower      Prior Function Prior Level of Function : Needs assist       Physical Assist : ADLs (physical)   ADLs (physical): Bathing;Dressing;Feeding Mobility Comments: ambulates with Rollator or RW, cargivers assist as needed ADLs Comments: caregivers assist with ADLs, meals, sometimes assist with feeding d/t 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     Extremity/Trunk Assessment   Upper Extremity Assessment Upper Extremity Assessment: Defer to OT evaluation    Lower Extremity Assessment Lower Extremity Assessment:  (bil LEs edematous)       Communication   Communication Communication: Impaired Factors Affecting Communication: Hearing impaired    Cognition Arousal: Alert Behavior During Therapy: WFL for tasks assessed/performed   PT - Cognitive impairments: No apparent impairments                         Following commands: Intact       Cueing Cueing Techniques: Verbal cues, Gestural cues, Tactile cues     General Comments      Exercises     Assessment/Plan    PT Assessment Patient needs continued PT services  PT Problem List Decreased strength;Decreased mobility;Decreased activity tolerance;Decreased balance       PT Treatment Interventions DME instruction;Therapeutic exercise;Gait training;Functional mobility training;Therapeutic activities;Patient/family education    PT Goals (Current goals can be found in the Care Plan section)  Acute Rehab PT Goals PT Goal Formulation: With patient/family Time For Goal Achievement: 08/28/24 Potential to Achieve Goals: Good    Frequency Min 3X/week     Co-evaluation               AM-PAC PT 6 Clicks Mobility  Outcome Measure Help needed turning from your back to your side while in a flat bed without using bedrails?: A Lot Help needed moving from lying on your back to sitting on the side of a flat bed without using  bedrails?: A Lot Help needed moving to and from a bed to a chair (including a wheelchair)?: A Lot Help needed standing up from a chair using your arms (e.g., wheelchair or bedside chair)?: A Lot Help needed to walk in hospital room?: A Lot Help needed climbing 3-5 steps with a railing? : A Lot 6 Click Score: 12    End of Session Equipment Utilized During Treatment: Gait belt Activity Tolerance: Patient tolerated treatment well Patient left: with call bell/phone within reach;with family/visitor present;in chair;with chair alarm set   PT Visit Diagnosis: Other abnormalities of gait and mobility (R26.89)    Time: 8894-8872 PT Time Calculation (min) (ACUTE ONLY): 22 min   Charges:   PT Evaluation $PT Eval Low Complexity: 1 Low   PT General Charges $$ ACUTE PT VISIT: 1 Visit         Denora Wysocki, PT  Acute Rehab Dept ([facility]) [phone]  08/14/2024   [facility] 08/14/2024, 1:32 PM

## 2024-08-14 NOTE — Assessment & Plan Note (Addendum)
 Continue Toprol -XL Holding Lasix ; she is no longer taking metolazone

## 2024-08-14 NOTE — Care Management Obs Status (Signed)
 MEDICARE OBSERVATION STATUS NOTIFICATION   Patient Details  Name: Lola Czerwonka MRN: [id number] Date of Birth: Apr 19, 1925   Medicare Observation Status Notification Given:  Yes    Hoy DELENA Bigness, LCSW 08/14/2024, 10:33 AM

## 2024-08-14 NOTE — Progress Notes (Signed)
 OT Cancellation Note  Patient Details Name: Shantrice Rodenberg MRN: [id number] DOB: [birth year]   Cancelled Treatment:    Reason Eval/Treat Not Completed: Other (comment) Pt received in bed. At baseline, pt requires assist for all ADL performance from 24/7 caregivers. Per pt, she typically is able to self-feed, but has noticed increased difficulties with functional grasp. No family or caregivers present - pt request OT return at later date with caregivers present for AE trial and edu. OT will follow up tomorrow as able.  Kaytlynne Neace L. Akshith Moncus, OTR/L  08/14/2024, 4:38 PM

## 2024-08-14 NOTE — Assessment & Plan Note (Addendum)
 Most recent labs are from 2023 so uncertain baseline renal function Creatinine 1.25 with GFR 39 on presentation, currently 1.16/43 Has had recent course of both nitrofurantoin and Bactrim Gentle IV fluid hydration for now Hold Lasix 

## 2024-08-14 NOTE — Assessment & Plan Note (Addendum)
 Not improved despite several courses of outpatient antibiotics She does not have any focal deficits to suggest acute CVA Leukopenia/neutropenia noted UA is not highly suspicious of active infection Continue IV ceftriaxone  for now Follow urine culture Start probiotics PT/OT eval, fall precautions Family has basically full-time caregivers and prefers home with [facility] if possible

## 2024-08-14 NOTE — Assessment & Plan Note (Addendum)
 Patient requesting no heparin /Lovenox  Discussed with pharmacist Will use low-dose Apixiban for DVT prevention instead

## 2024-08-14 NOTE — Assessment & Plan Note (Addendum)
 DNR confirmed at the time of admission [facility] documents reviewed Patient has a gold out of facility DNR form in [facility]

## 2024-08-14 NOTE — Progress Notes (Signed)
 " Progress Note   Patient: Cassandra Wu FMW:[id number] DOB: [birth year] DOA: 08/13/2024     0 DOS: the patient was seen and examined on 08/14/2024   Brief hospital course: 89yo with h/o HTN, recurrent UTI, and breast cancer s/p left mastectomy who presented on 1/3 with generalized weakness in setting of UTI failing outpatient antibiotics.    Assessment & Plan Generalized weakness Acute cystitis without hematuria Not improved despite several courses of outpatient antibiotics She does not have any focal deficits to suggest acute CVA Leukopenia/neutropenia noted UA is not highly suspicious of active infection Continue IV ceftriaxone  for now Follow urine culture Start probiotics PT/OT eval, fall precautions Family has basically full-time caregivers and prefers home with [facility] if possible AKI (acute kidney injury) Most recent labs are from 2023 so uncertain baseline renal function Creatinine 1.25 with GFR 39 on presentation, currently 1.16/43 Has had recent course of both nitrofurantoin and Bactrim Gentle IV fluid hydration for now Hold Lasix   Essential hypertension Continue Toprol -XL Holding Lasix ; she is no longer taking metolazone Allergy to pork Patient requesting no heparin /Lovenox  Discussed with pharmacist Will use low-dose Apixiban for DVT prevention instead DNR (do not resuscitate) DNR confirmed at the time of admission [facility] documents reviewed Patient has a gold out of facility DNR form in [place]      Consultants: PT OT  Procedures: None  Antibiotics: Ceftriaxone  1/3-      Subjective: Pleasant, cognitively intact.  Daughter present on 2nd visit.  No significant urinary symptoms - has had mucus dc following 3 recent bouts of antibiotics.  Mostly, she has had acute on chronic weakness over the last few days with worsening ability to perform ADLs.  Lives with essentially full-time caregivers.  Patient reported hand pain, but it appears to be more decreased  function and possible neuropathy than actual pain on further questioning.   Objective: Vitals:   08/14/24 0352 08/14/24 1257  BP: (!) 158/98 (!) 154/82  Pulse: 73 72  Resp: 18 16  Temp: 97.7 F (36.5 C) 98 F (36.7 C)  SpO2: 99% 99%    Intake/Output Summary (Last 24 hours) at 08/14/2024 1313 Last data filed at 08/14/2024 0902 Gross per 24 hour  Intake 929 ml  Output 650 ml  Net 279 ml   Filed Weights   08/13/24 1317  Weight: 68.9 kg    Exam:  General:  Appears calm and comfortable and is in NAD Eyes:  normal lids, iris ENT:  grossly normal hearing, lips & tongue, mmm Cardiovascular:  RRR. No LE edema.  Respiratory:   CTA bilaterally with no wheezes/rales/rhonchi.  Normal respiratory effort. Abdomen:  soft, NT, ND Skin:  no rash or induration seen on limited exam Musculoskeletal:  grossly normal tone BUE/BLE, good ROM, no bony abnormality; B hands without apparent deformity or abnormalities Psychiatric:  grossly normal mood and affect, speech fluent and appropriate, AOx3 Neurologic:  CN 2-12 grossly intact, moves all extremities in coordinated fashion  Data Reviewed: I have reviewed the patient's lab results since admission.  Pertinent labs for today include:   BUN 18/Creatinine 1.16/GFR 43, improved from 18/1.25/39 on 1/3 WBC 2.7 Hgb 9.7 UA: large LE, rare bacteria Urine culture is pending    Family Communication: None present initially and then daughter BANKER) was at the bedside shortly thereafter  Mobility: PT/OT Consulted     Code Status: Do not attempt resuscitation (DNR) PRE-ARREST INTERVENTIONS DESIRED    Disposition: Status is: Observation The patient remains OBS appropriate and will d/c before  2 midnights.     Time spent: 50 minutes  Unresulted Labs (From admission, onward)     Start     Ordered   08/13/24 1722  Urine Culture  Once,   R        08/13/24 1722             Author: Delon Herald, MD 08/14/2024 1:13 PM  For on call  review [email or web address].            "

## 2024-08-15 ENCOUNTER — Other Ambulatory Visit (HOSPITAL_COMMUNITY): Payer: Self-pay

## 2024-08-15 ENCOUNTER — Telehealth (HOSPITAL_COMMUNITY): Payer: Self-pay | Admitting: Pharmacy Technician

## 2024-08-15 DIAGNOSIS — R531 Weakness: Secondary | ICD-10-CM | POA: Diagnosis not present

## 2024-08-15 LAB — URINE CULTURE: Culture: <10000 — AB

## 2024-08-15 LAB — BASIC METABOLIC PANEL WITH GFR
Anion gap: 8 (ref 5–15)
BUN: 14 mg/dL (ref 8–23)
CO2: 25 mmol/L (ref 22–32)
Calcium: 9.1 mg/dL (ref 8.9–10.3)
Chloride: 103 mmol/L (ref 98–111)
Creatinine, Ser: 0.97 mg/dL (ref 0.44–1.00)
GFR, Estimated: 53 mL/min — ABNORMAL LOW
Glucose, Bld: 163 mg/dL — ABNORMAL HIGH (ref 70–99)
Potassium: 4.1 mmol/L (ref 3.5–5.1)
Sodium: 136 mmol/L (ref 135–145)

## 2024-08-15 LAB — CBC
HCT: 31.8 % — ABNORMAL LOW (ref 36.0–46.0)
Hemoglobin: 10.2 g/dL — ABNORMAL LOW (ref 12.0–15.0)
MCH: 28.2 pg (ref 26.0–34.0)
MCHC: 32.1 g/dL (ref 30.0–36.0)
MCV: 87.8 fL (ref 80.0–100.0)
Platelets: 157 K/uL (ref 150–400)
RBC: 3.62 MIL/uL — ABNORMAL LOW (ref 3.87–5.11)
RDW: 15 % (ref 11.5–15.5)
WBC: 2.5 K/uL — ABNORMAL LOW (ref 4.0–10.5)
nRBC: 0 % (ref 0.0–0.2)

## 2024-08-15 NOTE — Evaluation (Signed)
 Occupational Therapy Evaluation Patient Details Name: Cassandra Wu MRN: [id number] DOB: [birth year] Today's Date: 08/15/2024   History of Present Illness   89 y.o. female with medical history significant for HTN, recurrent UTI, breast cancer s/p left mastectomy who presented to the ED for evaluation of generalized weakness.     Clinical Impressions Pt reported at PLOF they live alone and has caregivers but has gaps in time (3-4 hours) there is no one there. When asked about what does she do for toileting if no one is there she hopes she does not have to go. At this time needed increase time to complete all tasks and heavy mod-max for supine to sitting and heavy mod assist to sit to stand to RW with bed elevated. Pt then was able to take side steps to the [facility] and then started to be able to step to chair with CGA -min assist to direct walker. At this time pt reported they prefer to go home but recommendation from continued inpatient follow up therapy, <3 hours/day. This reporting therapist voice concern if there was a fire and/or emergency on how they would exit the home but did not have a plan. Pt also advised about looking into ALF setting to ensure they have 24/7 support.       If plan is discharge home, recommend the following:   A lot of help with walking and/or transfers;A lot of help with bathing/dressing/bathroom;Assistance with cooking/housework;Assistance with feeding;Direct supervision/assist for medications management;Direct supervision/assist for financial management;Assist for transportation;Help with stairs or ramp for entrance     Functional Status Assessment   Patient has had a recent decline in their functional status and demonstrates the ability to make significant improvements in function in a reasonable and predictable amount of time.     Equipment Recommendations    (TBD at next venue)     Recommendations for Other Services          Precautions/Restrictions   Precautions Precautions: Fall Restrictions Weight Bearing Restrictions Per Provider Order: No     Mobility Bed Mobility Overal bed mobility: Needs Assistance Bed Mobility: Supine to Sit     Supine to sit: Mod assist, Used rails, HOB elevated     General bed mobility comments: max cues on sequencing    Transfers Overall transfer level: Needs assistance Equipment used: Rolling walker (2 wheels) Transfers: Sit to/from Stand Sit to Stand: Mod assist, From elevated surface           General transfer comment: increase in time and needs multiple attemps      Balance Overall balance assessment: Needs assistance Sitting-balance support: Feet supported, Bilateral upper extremity supported, No upper extremity supported Sitting balance-Leahy Scale: Fair     Standing balance support: Bilateral upper extremity supported Standing balance-Leahy Scale: Poor Standing balance comment: reliant on device and external assist                           ADL either performed or assessed with clinical judgement   ADL Overall ADL's : Needs assistance/impaired Eating/Feeding: Minimal assistance;Sitting;Set up Eating/Feeding Details (indicate cue type and reason): needs assist with opening all containers Grooming: Wash/dry face;Set up;Sitting   Upper Body Bathing: Contact guard assist;Minimal assistance;Sitting   Lower Body Bathing: Total assistance;Sit to/from stand   Upper Body Dressing : Contact guard assist;Sitting   Lower Body Dressing: Maximal assistance;Total assistance;Sit to/from stand;Sitting/lateral leans   Toilet Transfer: Moderate assistance;Cueing for safety;Cueing for sequencing;Rolling walker (2  wheels)   Toileting- Architect and Hygiene: Total assistance;Sit to/from stand       Functional mobility during ADLs: Contact guard assist;Minimal assistance;Rolling walker (2 wheels);Cueing for sequencing;Cueing for  safety       Vision         Perception         Praxis         Pertinent Vitals/Pain Pain Assessment Pain Assessment: 0-10 Pain Score: 2  Pain Location: BLE pain Pain Descriptors / Indicators: Aching Pain Intervention(s): Limited activity within patient's tolerance, Monitored during session     Extremity/Trunk Assessment Upper Extremity Assessment Upper Extremity Assessment: Generalized weakness   Lower Extremity Assessment Lower Extremity Assessment: Defer to PT evaluation       Communication Communication Communication: Impaired Factors Affecting Communication: Hearing impaired   Cognition Arousal: Alert Behavior During Therapy: WFL for tasks assessed/performed               OT - Cognition Comments: needs increase in time to process information                 Following commands: Intact       Cueing  General Comments   Cueing Techniques: Verbal cues;Gestural cues;Tactile cues      Exercises     Shoulder Instructions      Home Living Family/patient expects to be discharged to:: Private residence Living Arrangements: Alone Available Help at Discharge: Family;Personal care attendant Type of Home: House Home Access: Ramped entrance     Home Layout: One level               Home Equipment: Agricultural Consultant (2 wheels);Rollator (4 wheels);Tub bench;Hand held shower head;Grab bars - tub/shower;Hospital bed (lift chair)   Additional Comments: she reports not being able to place 4WW into bathroom area      Prior Functioning/Environment Prior Level of Function : Needs assist       Physical Assist : ADLs (physical)   ADLs (physical): Bathing;Dressing;Feeding Mobility Comments: ambulates with Rollator or RW, cargivers assist as needed ADLs Comments: caregivers assist with ADLs, meals, sometimes assist with feeding d/t 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    OT Problem List: Decreased strength;Decreased activity tolerance;Impaired balance  (sitting and/or standing);Decreased safety awareness;Decreased knowledge of use of DME or AE   OT Treatment/Interventions: Self-care/ADL training;Therapeutic exercise;DME and/or AE instruction;Therapeutic activities;Patient/family education;Balance training      OT Goals(Current goals can be found in the care plan section)   Acute Rehab OT Goals Patient Stated Goal: to go home OT Goal Formulation: With patient Time For Goal Achievement: 08/29/24 Potential to Achieve Goals: Good   OT Frequency:  Min 2X/week    Co-evaluation              AM-PAC OT 6 Clicks Daily Activity     Outcome Measure Help from another person eating meals?: A Little Help from another person taking care of personal grooming?: A Little Help from another person toileting, which includes using toliet, bedpan, or urinal?: Total Help from another person bathing (including washing, rinsing, drying)?: A Lot Help from another person to put on and taking off regular upper body clothing?: A Little Help from another person to put on and taking off regular lower body clothing?: A Lot 6 Click Score: 14   End of Session Equipment Utilized During Treatment: Gait belt;Rolling walker (2 wheels) Nurse Communication: Mobility status  Activity Tolerance: Patient tolerated treatment well Patient left: in chair;with call bell/phone within reach  OT  Visit Diagnosis: Unsteadiness on feet (R26.81);Other abnormalities of gait and mobility (R26.89);Repeated falls (R29.6);Muscle weakness (generalized) (M62.81)                Time: 1330-1409 OT Time Calculation (min): 39 min Charges:  OT General Charges $OT Visit: 1 Visit OT Evaluation $OT Eval Moderate Complexity: 1 Mod OT Treatments $Self Care/Home Management : 8-22 mins  Warrick POUR OTR/L  Acute Rehab Services  [phone] office number   Warrick Berber 08/15/2024, 2:24 PM

## 2024-08-15 NOTE — Progress Notes (Signed)
 OT Cancellation Note  Patient Details Name: Demonica Farrey MRN: [id number] DOB: Oct 11, 1924   Cancelled Treatment:    Reason Eval/Treat Not Completed: Other (comment) (No caregivers present at this time as last note about attempting to assess if self feeding education needed for family. Recomendation for HHOT to further assess in home enviroment as well. Will continue to follow.)  Nguyen Butler K OTR/L  Acute Rehab Services  [phone] office number   Warrick Berber 08/15/2024, 10:37 AM

## 2024-08-15 NOTE — Assessment & Plan Note (Deleted)
 DNR confirmed at the time of admission [facility] documents reviewed Patient has a gold out of facility DNR form in [facility]

## 2024-08-15 NOTE — Assessment & Plan Note (Deleted)
 Continue Toprol -XL Holding Lasix ; she is no longer taking metolazone

## 2024-08-15 NOTE — Assessment & Plan Note (Deleted)
 Patient requesting no heparin /Lovenox  Discussed with pharmacist Will use low-dose Apixiban for DVT prevention instead

## 2024-08-15 NOTE — Assessment & Plan Note (Signed)
 Most recent labs are from 2023 so uncertain baseline renal function Creatinine 1.25 with GFR 39 on presentation, currently 0.97/53 Has had recent course of both nitrofurantoin and Bactrim Given gentle IV fluid hydration Hold Lasix   Recheck BMP in AM off IVF

## 2024-08-15 NOTE — Progress Notes (Signed)
 Physical Therapy Treatment Patient Details Name: Cassandra Wu MRN: [id number] DOB: 1924/11/10 Today's Date: 08/15/2024   History of Present Illness 89 y.o. female with medical history significant for HTN, recurrent UTI, breast cancer s/p left mastectomy who presented to the ED for evaluation of generalized weakness.    PT Comments   Patient  is used to a lift chair, max assist to slowly stand from recliner with assist to power up, slow to step each foot back to recliner, then transition hands to  RW. Patient slowly took steps x ` 6', chair followed.   Patient does reports periods of being home alone.  I just wait for someone to come.   Patient will benefit from continued inpatient follow up therapy, <3 hours/day.     If plan is discharge home, recommend the following: A lot of help with bathing/dressing/bathroom;A lot of help with walking and/or transfers;Help with stairs or ramp for entrance;Assist for transportation;Assistance with cooking/housework   Can travel by private vehicle     No  Equipment Recommendations  None recommended by PT    Recommendations for Other Services       Precautions / Restrictions Precautions Precautions: Fall Restrictions Weight Bearing Restrictions Per Provider Order: No     Mobility  Bed Mobility               General bed mobility comments: in recliner    Transfers Overall transfer level: Needs assistance Equipment used: Rolling walker (2 wheels) Transfers: Sit to/from Stand Sit to Stand: Max assist           General transfer comment: from recliner, max support and bed pad to rise from the recliner, very slow to step feet back under as patiient stands up then very slow to tnasition hands from recliner to RW, cues to reach back    Ambulation/Gait Ambulation/Gait assistance: Mod assist Gait Distance (Feet): 6 Feet Assistive device: Rolling walker (2 wheels) Gait Pattern/deviations: Decreased step length - right,  Decreased step length - left, Trunk flexed       General Gait Details: cues for trunk and hip extension,  for RW proximity. assist to steady and manage RW, very slow   Stairs             Wheelchair Mobility     Tilt Bed    Modified Rankin (Stroke Patients Only)       Balance Overall balance assessment: Needs assistance Sitting-balance support: Feet supported, Bilateral upper extremity supported, No upper extremity supported Sitting balance-Leahy Scale: Fair Sitting balance - Comments: poor to fair, progress to static sit without support after ~ 3 minutes Postural control: Posterior lean Standing balance support: Bilateral upper extremity supported Standing balance-Leahy Scale: Poor Standing balance comment: reliant on device and external assist                            Communication Communication Communication: Impaired Factors Affecting Communication: Hearing impaired  Cognition Arousal: Alert Behavior During Therapy: WFL for tasks assessed/performed, Flat affect   PT - Cognitive impairments: No apparent impairments                         Following commands: Intact      Cueing Cueing Techniques: Verbal cues, Gestural cues, Tactile cues  Exercises      General Comments        Pertinent Vitals/Pain Pain Assessment Faces Pain Scale: Hurts a little bit  Pain Location: BLE pain Pain Descriptors / Indicators: Aching Pain Intervention(s): Monitored during session    Home Living Family/patient expects to be discharged to:: Private residence Living Arrangements: Alone Available Help at Discharge: Family;Personal care attendant Type of Home: House Home Access: Ramped entrance       Home Layout: One level Home Equipment: Agricultural Consultant (2 wheels);Rollator (4 wheels);Tub bench;Hand held shower head;Grab bars - tub/shower;Hospital bed (lift chair) Additional Comments: she reports not being able to place 4WW into bathroom area     Prior Function            PT Goals (current goals can now be found in the care plan section) Progress towards PT goals: Progressing toward goals    Frequency    Min 2X/week      PT Plan      Co-evaluation              AM-PAC PT 6 Clicks Mobility   Outcome Measure  Help needed turning from your back to your side while in a flat bed without using bedrails?: A Lot Help needed moving from lying on your back to sitting on the side of a flat bed without using bedrails?: A Lot Help needed moving to and from a bed to a chair (including a wheelchair)?: A Lot Help needed standing up from a chair using your arms (e.g., wheelchair or bedside chair)?: A Lot Help needed to walk in hospital room?: Total Help needed climbing 3-5 steps with a railing? : Total 6 Click Score: 10    End of Session Equipment Utilized During Treatment: Gait belt Activity Tolerance: Patient tolerated treatment well Patient left: in chair;with call bell/phone within reach Nurse Communication: Mobility status PT Visit Diagnosis: Other abnormalities of gait and mobility (R26.89)     Time: 8571-8546 PT Time Calculation (min) (ACUTE ONLY): 25 min  Charges:    $Gait Training: 23-37 mins PT General Charges $$ ACUTE PT VISIT: 1 Visit                     Darice Potters PT Acute Rehabilitation Services Office [phone]    Potters Darice Norris 08/15/2024, 3:00 PM

## 2024-08-15 NOTE — Assessment & Plan Note (Signed)
 Continue Toprol -XL Holding Lasix ; she is no longer taking metolazone

## 2024-08-15 NOTE — Assessment & Plan Note (Signed)
 Patient requesting no heparin /Lovenox  Discussed with pharmacist Will use low-dose Apixiban for DVT prevention instead

## 2024-08-15 NOTE — Assessment & Plan Note (Signed)
 Not improved despite several courses of outpatient antibiotics for UTI She does not have any focal deficits to suggest acute CVA Leukopenia/neutropenia noted UA is not highly suspicious of active infection Negative urine culture Given 2 doses of Ceftriaxone  and then it was stopped due to low suspicion for current infection Start probiotics PT/OT eval, fall precautions Family has basically full-time caregivers but would like to pursue STR for now given generalized weakness associated with multiple infections

## 2024-08-15 NOTE — Assessment & Plan Note (Deleted)
 Not improved despite several courses of outpatient antibiotics She does not have any focal deficits to suggest acute CVA Leukopenia/neutropenia noted UA is not highly suspicious of active infection Continue IV ceftriaxone  for now Follow urine culture Start probiotics PT/OT eval, fall precautions Family has basically full-time caregivers and prefers home with [facility] if possible

## 2024-08-15 NOTE — Telephone Encounter (Signed)
 Patient Product/process Development Scientist completed.    The patient is insured through [facility]. Patient has Medicare and is not eligible for a copay card, but may be able to apply for patient assistance or Medicare RX Payment Plan (Patient Must reach out to their plan, if eligible for payment plan), if available.    Ran test claim for Eliquis  2.5 mg and the current 30 day co-pay is $249.45 due to a deductible.   This test claim was processed through [facility]- copay amounts may vary at other pharmacies due to pharmacy/plan contracts, or as the patient moves through the different stages of their insurance plan.     Reyes Sharps, CPHT Pharmacy Technician Patient Advocate Specialist Lead [facility] Patient Advocate Team Direct Number: [phone]  Fax: [phone]

## 2024-08-15 NOTE — Assessment & Plan Note (Deleted)
 Most recent labs are from 2023 so uncertain baseline renal function Creatinine 1.25 with GFR 39 on presentation, currently 1.16/43 Has had recent course of both nitrofurantoin and Bactrim Gentle IV fluid hydration for now Hold Lasix 

## 2024-08-15 NOTE — TOC Initial Note (Signed)
 Transition of Care [facility]) - Initial/Assessment Note   Patient Details  Name: Cassandra Wu MRN: [id number] Date of Birth: 12/08/1924  [facility] CM/SW Contact:    Duwaine GORMAN Aran, LCSW Phone Number: 08/15/2024, 9:53 AM  Clinical Narrative: PT evaluation recommended HH. Patient is agreeable to being set up with HHPT. CSW faxed out referral in hub. Care management awaiting offers.  Expected Discharge Plan: Home w Home Health Services Barriers to Discharge: Continued Medical Work up  Patient Goals and CMS Choice Patient states their goals for this hospitalization and ongoing recovery are:: Return home CMS Medicare.gov Compare Post Acute Care list provided to:: Patient Choice offered to / list presented to : Patient  Expected Discharge Plan and Services In-house Referral: Clinical Social Work Post Acute Care Choice: Home Health Living arrangements for the past 2 months: Single Family Home           DME Arranged: N/A DME Agency: NA  Prior Living Arrangements/Services Living arrangements for the past 2 months: Single Family Home Patient language and need for interpreter reviewed:: Yes Do you feel safe going back to the place where you live?: Yes      Need for Family Participation in Patient Care: No (Comment) Care giver support system in place?: Yes (comment) Criminal Activity/Legal Involvement Pertinent to Current Situation/Hospitalization: No - Comment as needed  Activities of Daily Living ADL Screening (condition at time of admission) Independently performs ADLs?: No Does the patient have a NEW difficulty with bathing/dressing/toileting/self-feeding that is expected to last >3 days?: No Does the patient have a NEW difficulty with getting in/out of bed, walking, or climbing stairs that is expected to last >3 days?: No Does the patient have a NEW difficulty with communication that is expected to last >3 days?: No Is the patient deaf or have difficulty hearing?:  No Does the patient have difficulty seeing, even when wearing glasses/contacts?: No Does the patient have difficulty concentrating, remembering, or making decisions?: No  Permission Sought/Granted Permission sought to share information with : Other (comment) Permission granted to share information with : Yes, Verbal Permission Granted Permission granted to share info w AGENCY: HH agencies  Emotional Assessment Appearance:: Appears stated age Attitude/Demeanor/Rapport: Engaged Affect (typically observed): Accepting Orientation: : Oriented to Self, Oriented to Place, Oriented to  Time, Oriented to Situation Alcohol / Substance Use: Not Applicable Psych Involvement: No (comment)  Admission diagnosis:  Weakness [R53.1] Acute cystitis without hematuria [N30.00] Generalized weakness [R53.1] Patient Active Problem List   Diagnosis Date Noted   DNR (do not resuscitate) 08/14/2024   Allergy to pork 08/14/2024   Acute cystitis without hematuria 08/13/2024   Leukopenia 08/13/2024   AKI (acute kidney injury) 08/13/2024   Generalized weakness 07/03/2022   COVID-19 virus infection 07/03/2022   Chronic diastolic (congestive) heart failure (HCC) 08/13/2021   Acute gastric ulcer with hemorrhage    Heme positive stool    GI bleed 08/10/2021   Anemia 08/10/2021   Dyspnea 08/10/2021   Overweight (BMI 25.0-29.9) 08/10/2021   CARPAL TUNNEL SYNDROME, RIGHT 07/16/2010   Essential hypertension 03/19/2010   History of TIA (transient ischemic attack) 03/19/2010   LYMPHEDEMA, LEFT ARM 03/19/2010   DEPENDENT EDEMA, LEGS, BILATERAL 03/19/2010   OTHER CHEST PAIN 03/19/2010   MASTECTOMY, BILATERAL, HX OF 03/19/2010   PCP:  Harlie Clint SQUIBB, FNP Pharmacy:   [id number] - [place], [place] - [address] [place] [place] [address] Phone: [phone] Fax: [phone]  Social Drivers of Health (SDOH) Social  History: SDOH Screenings   Food Insecurity: No Food  Insecurity (08/13/2024)  Housing: Low Risk (08/13/2024)  Transportation Needs: No Transportation Needs (08/13/2024)  Utilities: Not At Risk (08/13/2024)  Social Connections: Unknown (08/13/2024)  Tobacco Use: Low Risk (08/14/2024)   SDOH Interventions:    Readmission Risk Interventions     No data to display

## 2024-08-15 NOTE — Progress Notes (Signed)
 " Progress Note   Patient: Cassandra Wu FMW:[id number] DOB: [birth year] DOA: 08/13/2024     1 DOS: the patient was seen and examined on 08/15/2024   Brief hospital course: 89yo with h/o HTN, recurrent UTI, and breast cancer s/p left mastectomy who presented on 1/3 with generalized weakness in setting of recurrent UTIs.  No current UTI but she has residual weakness and appears to need STR.     Assessment & Plan Generalized weakness Not improved despite several courses of outpatient antibiotics for UTI She does not have any focal deficits to suggest acute CVA Leukopenia/neutropenia noted UA is not highly suspicious of active infection Negative urine culture Given 2 doses of Ceftriaxone  and then it was stopped due to low suspicion for current infection Start probiotics PT/OT eval, fall precautions Family has basically full-time caregivers but would like to pursue STR for now given generalized weakness associated with multiple infections AKI (acute kidney injury) Most recent labs are from 2023 so uncertain baseline renal function Creatinine 1.25 with GFR 39 on presentation, currently 0.97/53 Has had recent course of both nitrofurantoin and Bactrim Given gentle IV fluid hydration Hold Lasix   Recheck BMP in AM off IVF Essential hypertension Continue Toprol -XL Holding Lasix ; she is no longer taking metolazone Allergy to pork Patient requesting no heparin /Lovenox  Discussed with pharmacist Will use low-dose Apixiban for DVT prevention instead DNR (do not resuscitate) DNR confirmed at the time of admission [facility] documents reviewed Patient has a gold out of facility DNR form in [place]      Consultants: PT OT   Procedures: None   Antibiotics: Ceftriaxone  1/3-  30 Day Unplanned Readmission Risk Score    Flowsheet Row ED to Hosp-Admission (Current) from 08/13/2024 in [place]  30 Day Unplanned Readmission Risk Score (%) 10.94 Filed at 08/15/2024 1200     This score is the patient's risk of an unplanned readmission within 30 days of being discharged (0 -100%). The score is based on dignosis, age, lab data, medications, orders, and past utilization.   Low:  0-14.9   Medium: 15-21.9   High: 22-29.9   Extreme: 30 and above           Subjective: Mildly confused, awoke and realized she was in the hospital and got upset.  Otherwise, no new concerns, feeling ok.  She does not want to go to SNF (although her daughter does want that for her).   Objective: Vitals:   08/15/24 0810 08/15/24 1133  BP: (!) 166/83 (!) 165/98  Pulse: 68 74  Resp:    Temp:    SpO2:  100%    Intake/Output Summary (Last 24 hours) at 08/15/2024 1436 Last data filed at 08/15/2024 1100 Gross per 24 hour  Intake 1410.6 ml  Output 1100 ml  Net 310.6 ml   Filed Weights   08/13/24 1317  Weight: 68.9 kg    Exam:  General:  Appears calm and comfortable and is in NAD Eyes:  normal lids, iris ENT:  grossly normal hearing, lips & tongue, mmm Cardiovascular:  RRR. No LE edema.  Respiratory:   CTA bilaterally with no wheezes/rales/rhonchi.  Normal respiratory effort. Abdomen:  soft, NT, ND Skin:  no rash or induration seen on limited exam Musculoskeletal:  grossly normal tone BUE/BLE, good ROM, no bony abnormality Psychiatric:  grossly normal mood and affect, speech fluent and appropriate, AOx3 Neurologic:  CN 2-12 grossly intact, moves all extremities in coordinated fashion  Data Reviewed: I have reviewed the patient's lab results  since admission.  Pertinent labs for today include:   Glucose 163 BUN 14/Creatinine 0.97/GFR 53, improved WBC 2.5 Hgb 10.2, stable Urine culture without significant growth     Family Communication: None present; I spoke with her daughter by telephone  Mobility: PT/OT Consulted and are recommending - Home Health Pt1/11/2024 1328    Code Status: Do not attempt resuscitation (DNR) PRE-ARREST INTERVENTIONS  DESIRED   Disposition: Status is: Inpatient Remains inpatient appropriate because: determining next destination of care     Time spent: 50 minutes  Unresulted Labs (From admission, onward)     Start     Ordered   08/16/24 0500  CBC  Tomorrow morning,   R       Question:  Specimen collection method  Answer:  IV Team=IV Team collect   08/15/24 1432   08/16/24 0500  Basic metabolic panel with GFR  Tomorrow morning,   R       Question:  Specimen collection method  Answer:  IV Team=IV Team collect   08/15/24 1432             Author: Delon Herald, MD 08/15/2024 2:36 PM  For on call review [email or web address].            "

## 2024-08-15 NOTE — Assessment & Plan Note (Signed)
 DNR confirmed at the time of admission [facility] documents reviewed Patient has a gold out of facility DNR form in [facility]

## 2024-08-16 DIAGNOSIS — R531 Weakness: Secondary | ICD-10-CM | POA: Diagnosis not present

## 2024-08-16 LAB — CBC
HCT: 29.2 % — ABNORMAL LOW (ref 36.0–46.0)
Hemoglobin: 9.5 g/dL — ABNORMAL LOW (ref 12.0–15.0)
MCH: 28.1 pg (ref 26.0–34.0)
MCHC: 32.5 g/dL (ref 30.0–36.0)
MCV: 86.4 fL (ref 80.0–100.0)
Platelets: 173 K/uL (ref 150–400)
RBC: 3.38 MIL/uL — ABNORMAL LOW (ref 3.87–5.11)
RDW: 15 % (ref 11.5–15.5)
WBC: 2.7 K/uL — ABNORMAL LOW (ref 4.0–10.5)
nRBC: 0 % (ref 0.0–0.2)

## 2024-08-16 LAB — BASIC METABOLIC PANEL WITH GFR
Anion gap: 7 (ref 5–15)
BUN: 17 mg/dL (ref 8–23)
CO2: 25 mmol/L (ref 22–32)
Calcium: 9.2 mg/dL (ref 8.9–10.3)
Chloride: 105 mmol/L (ref 98–111)
Creatinine, Ser: 0.83 mg/dL (ref 0.44–1.00)
GFR, Estimated: >60 mL/min
Glucose, Bld: 91 mg/dL (ref 70–99)
Potassium: 4.2 mmol/L (ref 3.5–5.1)
Sodium: 136 mmol/L (ref 135–145)

## 2024-08-16 MED ORDER — AMLODIPINE BESYLATE 5 MG PO TABS
5.0000 mg | ORAL_TABLET | Freq: Every day | ORAL | Status: DC
  Administered 2024-08-16 – 2024-08-19 (×4): 5 mg via ORAL
  Filled 2024-08-16 (×4): qty 1

## 2024-08-16 NOTE — NC FL2 (Signed)
 " [place]  MEDICAID FL2 LEVEL OF CARE FORM     IDENTIFICATION  Patient Name: Cassandra Wu Birthdate: [birth year] Sex: female Admission Date (Current Location): 08/13/2024  [facility] and [place] Number:  Producer, Television/film/video and Address:  [facility],  [address] [place], [place] [address]      Provider Number: [id number]  Attending Physician Name and Address:  Barbarann Nest, MD  Relative Name and Phone Number:  Laurent Sours  Daughter, Emergency Contact  [phone] [facility])    Current Level of Care: Hospital Recommended Level of Care: Skilled Nursing Facility Prior Approval Number:    Date Approved/Denied:   PASRR Number: [id number]  Discharge Plan: SNF    Current Diagnoses: Patient Active Problem List   Diagnosis Date Noted   DNR (do not resuscitate) 08/14/2024   Allergy to pork 08/14/2024   Acute cystitis without hematuria 08/13/2024   Leukopenia 08/13/2024   AKI (acute kidney injury) 08/13/2024   Generalized weakness 07/03/2022   COVID-19 virus infection 07/03/2022   Chronic diastolic (congestive) heart failure (HCC) 08/13/2021   Acute gastric ulcer with hemorrhage    Heme positive stool    GI bleed 08/10/2021   Anemia 08/10/2021   Dyspnea 08/10/2021   Overweight (BMI 25.0-29.9) 08/10/2021   CARPAL TUNNEL SYNDROME, RIGHT 07/16/2010   Essential hypertension 03/19/2010   History of TIA (transient ischemic attack) 03/19/2010   LYMPHEDEMA, LEFT ARM 03/19/2010   DEPENDENT EDEMA, LEGS, BILATERAL 03/19/2010   OTHER CHEST PAIN 03/19/2010   MASTECTOMY, BILATERAL, HX OF 03/19/2010    Orientation RESPIRATION BLADDER Height & Weight     Self  Normal Incontinent, External catheter Weight: 68.9 kg Height:  5' (152.4 cm)  BEHAVIORAL SYMPTOMS/MOOD NEUROLOGICAL BOWEL NUTRITION STATUS      Continent Diet (regular)  AMBULATORY STATUS COMMUNICATION OF NEEDS Skin   Extensive Assist Verbally Normal                       Personal Care Assistance  Level of Assistance  Bathing, Feeding, Dressing Bathing Assistance: Maximum assistance Feeding assistance: Limited assistance Dressing Assistance: Limited assistance     Functional Limitations Info  Sight, Hearing, Speech Sight Info: Impaired Hearing Info: Adequate Speech Info: Adequate    SPECIAL CARE FACTORS FREQUENCY  PT (By licensed PT), OT (By licensed OT)     PT Frequency: 5x/wk OT Frequency: 5x/wk            Contractures Contractures Info: Not present    Additional Factors Info  Code Status, Allergies, Psychotropic Code Status Info: DNR Allergies Info: Penicillins, Porcine (Pork) Protein-containing Drug Products, Propranolol Hcl Psychotropic Info: N/A         Current Medications (08/16/2024):  This is the current hospital active medication list Current Facility-Administered Medications  Medication Dose Route Frequency Provider Last Rate Last Admin   acetaminophen  (TYLENOL ) tablet 650 mg  650 mg Oral Q6H PRN Patel, Vishal R, MD   650 mg at 08/15/24 2108   Or   acetaminophen  (TYLENOL ) suppository 650 mg  650 mg Rectal Q6H PRN Patel, Vishal R, MD       ALPRAZolam  (XANAX ) tablet 0.5 mg  0.5 mg Oral Daily PRN Barbarann Nest, MD   0.5 mg at 08/15/24 0100   apixaban  (ELIQUIS ) tablet 2.5 mg  2.5 mg Oral BID Barbarann Nest, MD   2.5 mg at 08/16/24 0925   aspirin  EC tablet 81 mg  81 mg Oral Daily Barbarann Nest, MD   81 mg at 08/16/24 [phone]  fluticasone  (FLONASE ) 50 MCG/ACT nasal spray 1 spray  1 spray Each Nare Daily Barbarann Nest, MD   1 spray at 08/15/24 0809   metoprolol  succinate (TOPROL -XL) 24 hr tablet 50 mg  50 mg Oral Daily Patel, Vishal R, MD   50 mg at 08/16/24 9074   ondansetron  (ZOFRAN ) tablet 4 mg  4 mg Oral Q6H PRN Patel, Vishal R, MD       Or   ondansetron  (ZOFRAN ) injection 4 mg  4 mg Intravenous Q6H PRN Patel, Vishal R, MD       polyethylene glycol (MIRALAX  / GLYCOLAX ) packet 17 g  17 g Oral Daily PRN Patel, Vishal R, MD       saccharomyces boulardii  (FLORASTOR) capsule 250 mg  250 mg Oral BID Barbarann Nest, MD   250 mg at 08/16/24 9073   sodium chloride  (OCEAN) 0.65 % nasal spray 1 spray  1 spray Each Nare PRN Barbarann Nest, MD       sodium chloride  flush (NS) 0.9 % injection 10-40 mL  10-40 mL Intracatheter PRN Patel, Vishal R, MD         Discharge Medications: Please see discharge summary for a list of discharge medications.  Relevant Imaging Results:  Relevant Lab Results:   Additional Information SSN: [id number]  Alfonse JONELLE Rex, RN     "

## 2024-08-16 NOTE — Assessment & Plan Note (Addendum)
 Not improved despite several courses of outpatient antibiotics for UTI She does not have any focal deficits to suggest acute CVA Leukopenia/neutropenia noted UA is not highly suspicious of active infection Negative urine culture Given 2 doses of Ceftriaxone  and then it was stopped due to low suspicion for current infection Start probiotics PT/OT eval, fall precautions Family has basically full-time caregivers but would like to pursue STR for now given generalized weakness associated with multiple infections Patient is reluctantly in agreement with this plan

## 2024-08-16 NOTE — Progress Notes (Signed)
 Mobility Specialist Progress Note:   08/16/24 1259  Mobility  Activity Pivoted/transferred from chair to bed  Level of Assistance +2 (takes two people)  Press Photographer wheel walker  Distance Ambulated (ft) 2 ft  Activity Response Tolerated well  Mobility Referral Yes  Mobility visit 1 Mobility  Mobility Specialist Start Time (ACUTE ONLY) 1240  Mobility Specialist Stop Time (ACUTE ONLY) 1255  Mobility Specialist Time Calculation (min) (ACUTE ONLY) 15 min   Pt was received in recliner and agreed to mobility. Mod A +2 people. Returned to bed with all needs met. Left in room with RN.  [employer] - Mobility Specialist

## 2024-08-16 NOTE — Progress Notes (Signed)
 Mobility Specialist Progress Note:   08/16/24 0924  Mobility  Activity Pivoted/transferred from bed to chair  Level of Assistance +2 (takes two people)  Assistive Device Front wheel walker  Activity Response Tolerated fair  Mobility Referral Yes  Mobility visit 1 Mobility  Mobility Specialist Start Time (ACUTE ONLY) [id number]  Mobility Specialist Stop Time (ACUTE ONLY) 0915  Mobility Specialist Time Calculation (min) (ACUTE ONLY) 21 min   Pt was received in bed and agreeable to mobilize. Max A w/ +2 people. Returned to recliner with all needs met. Call bell in reach.  [employer] - Mobility Specialist

## 2024-08-16 NOTE — Assessment & Plan Note (Addendum)
 Continue Toprol -XL Holding Lasix ; she is no longer taking metolazone Current BP is 180/85; goal is <160/90 Will add amlodipine 

## 2024-08-16 NOTE — Assessment & Plan Note (Addendum)
 Most recent labs are from 2023 so uncertain baseline renal function Creatinine 1.25 with GFR 39 on presentation, currently 0.97/53 Has had recent course of both nitrofurantoin and Bactrim Given gentle IV fluid hydration Hold Lasix   Resolved

## 2024-08-16 NOTE — Progress Notes (Signed)
 " Progress Note   Patient: Cassandra Wu FMW:[id number] DOB: [birth year] DOA: 08/13/2024     2 DOS: the patient was seen and examined on 08/16/2024   Brief hospital course: 89yo with h/o HTN, recurrent UTI, and breast cancer s/p left mastectomy who presented on 1/3 with generalized weakness in setting of recurrent UTIs.  No current UTI but she has residual weakness and appears to need STR.    Assessment & Plan Generalized weakness Not improved despite several courses of outpatient antibiotics for UTI She does not have any focal deficits to suggest acute CVA Leukopenia/neutropenia noted UA is not highly suspicious of active infection Negative urine culture Given 2 doses of Ceftriaxone  and then it was stopped due to low suspicion for current infection Start probiotics PT/OT eval, fall precautions Family has basically full-time caregivers but would like to pursue STR for now given generalized weakness associated with multiple infections Patient is reluctantly in agreement with this plan AKI (acute kidney injury) Most recent labs are from 2023 so uncertain baseline renal function Creatinine 1.25 with GFR 39 on presentation, currently 0.97/53 Has had recent course of both nitrofurantoin and Bactrim Given gentle IV fluid hydration Hold Lasix   Resolved Essential hypertension Continue Toprol -XL Holding Lasix ; she is no longer taking metolazone Current BP is 180/85; goal is <160/90 Will add amlodipine  Allergy to pork Patient requesting no heparin /Lovenox  Discussed with pharmacist Will use low-dose Apixiban for DVT prevention instead DNR (do not resuscitate) DNR confirmed at the time of admission [facility] documents reviewed Patient has a gold out of facility DNR form in [place]      Consultants: PT OT   Procedures: None   Antibiotics: Ceftriaxone  1/3-5  30 Day Unplanned Readmission Risk Score    Flowsheet Row ED to Hosp-Admission (Current) from 08/13/2024 in [facility]  30 Day Unplanned Readmission Risk Score (%) 10.96 Filed at 08/16/2024 0801    This score is the patient's risk of an unplanned readmission within 30 days of being discharged (0 -100%). The score is based on dignosis, age, lab data, medications, orders, and past utilization.   Low:  0-14.9   Medium: 15-21.9   High: 22-29.9   Extreme: 30 and above           Subjective: Unhappy about plan for placement.  Otherwise no new issues.  Seen sitting up in bedside chair feeding herself.   Objective: Vitals:   08/16/24 1119 08/16/24 1322  BP: (!) 183/107 (!) 180/85  Pulse: 76 76  Resp:  18  Temp:  97.9 F (36.6 C)  SpO2: 100% 100%    Intake/Output Summary (Last 24 hours) at 08/16/2024 1819 Last data filed at 08/16/2024 1322 Gross per 24 hour  Intake 560 ml  Output 625 ml  Net -65 ml   Filed Weights   08/13/24 1317  Weight: 68.9 kg    Exam:  General:  Appears calm and comfortable and is in NAD Eyes:  normal lids, iris ENT:  grossly normal hearing, lips & tongue, mmm Cardiovascular:  RRR. No LE edema.  Respiratory:   CTA bilaterally with no wheezes/rales/rhonchi.  Normal respiratory effort. Abdomen:  soft, NT, ND Skin:  no rash or induration seen on limited exam Musculoskeletal:  generalized weakness, no bony abnormality Psychiatric:  grossly normal mood and affect, speech fluent and appropriate, AOx3 Neurologic:  CN 2-12 grossly intact, moves all extremities in coordinated fashion  Data Reviewed: I have reviewed the patient's lab results since admission.  Pertinent labs for today  include:   Normal BMP Stable CBC     Family Communication: None present  Mobility: PT/OT Consulted and are recommending - Skilled Nursing-Short Term Rehab (<3 Hours/Day)08/15/2024 1459    Code Status: Do not attempt resuscitation (DNR) PRE-ARREST INTERVENTIONS DESIRED   Disposition: Status is: Inpatient Remains inpatient appropriate because: awaiting  placement     Time spent: 50 minutes  Unresulted Labs (From admission, onward)    None        Author: Delon Herald, MD 08/16/2024 6:19 PM  For on call review [email or web address].            "

## 2024-08-16 NOTE — Assessment & Plan Note (Addendum)
 Patient requesting no heparin /Lovenox  Discussed with pharmacist Will use low-dose Apixiban for DVT prevention instead

## 2024-08-16 NOTE — TOC Progression Note (Signed)
 Transition of Care [facility]) - Progression Note    Patient Details  Name: Cassandra Wu MRN: [id number] Date of Birth: 05/02/1925  Transition of Care [facility]) CM/SW Contact  Alfonse JONELLE Rex, RN Phone Number: 08/16/2024, 11:46 AM  Clinical Narrative:   PT eval completed, recommendation for short term rehab/SNF. TOC consult received for SNF Placement, family agreeable. FL2 updated, faxed out for bed offers.     Expected Discharge Plan: Skilled Nursing Facility Barriers to Discharge: Continued Medical Work up               Expected Discharge Plan and Services In-house Referral: Clinical Social Work   Post Acute Care Choice: Home Health Living arrangements for the past 2 months: Single Family Home                 DME Arranged: N/A DME Agency: NA                   Social Drivers of Health (SDOH) Interventions SDOH Screenings   Food Insecurity: No Food Insecurity (08/13/2024)  Housing: Low Risk (08/13/2024)  Transportation Needs: No Transportation Needs (08/13/2024)  Utilities: Not At Risk (08/13/2024)  Social Connections: Unknown (08/13/2024)  Tobacco Use: Low Risk (08/14/2024)    Readmission Risk Interventions    08/16/2024   11:45 AM  Readmission Risk Prevention Plan  Post Dischage Appt Complete  Medication Screening Complete  Transportation Screening Complete

## 2024-08-16 NOTE — Assessment & Plan Note (Addendum)
 DNR confirmed at the time of admission [facility] documents reviewed Patient has a gold out of facility DNR form in [facility]

## 2024-08-17 DIAGNOSIS — R531 Weakness: Secondary | ICD-10-CM | POA: Diagnosis not present

## 2024-08-17 LAB — TSH: TSH: 2.41 u[IU]/mL (ref 0.350–4.500)

## 2024-08-17 LAB — FOLATE: Folate: >20 ng/mL

## 2024-08-17 LAB — T4, FREE: Free T4: 1.06 ng/dL (ref 0.80–2.00)

## 2024-08-17 LAB — C-REACTIVE PROTEIN: CRP: <0.5 mg/dL

## 2024-08-17 LAB — VITAMIN B12: Vitamin B-12: 1169 pg/mL — ABNORMAL HIGH (ref 180–914)

## 2024-08-17 MED ORDER — DOCUSATE SODIUM 50 MG PO CAPS
50.0000 mg | ORAL_CAPSULE | Freq: Once | ORAL | Status: AC
  Administered 2024-08-17: 50 mg via ORAL
  Filled 2024-08-17: qty 1

## 2024-08-17 MED ORDER — FERROUS SULFATE 325 (65 FE) MG PO TABS
325.0000 mg | ORAL_TABLET | ORAL | Status: DC
  Administered 2024-08-17 – 2024-08-19 (×2): 325 mg via ORAL
  Filled 2024-08-17 (×2): qty 1

## 2024-08-17 MED ORDER — HYDROCHLOROTHIAZIDE 12.5 MG PO TABS
12.5000 mg | ORAL_TABLET | Freq: Every day | ORAL | Status: DC
  Administered 2024-08-17 – 2024-08-19 (×3): 12.5 mg via ORAL
  Filled 2024-08-17 (×3): qty 1

## 2024-08-17 MED ORDER — HYDRALAZINE HCL 20 MG/ML IJ SOLN
5.0000 mg | Freq: Four times a day (QID) | INTRAMUSCULAR | Status: AC | PRN
  Administered 2024-08-17 – 2024-08-18 (×2): 5 mg via INTRAVENOUS
  Filled 2024-08-17 (×2): qty 1

## 2024-08-17 NOTE — TOC Progression Note (Addendum)
 Transition of Care [facility]) - Progression Note    Patient Details  Name: Cassandra Wu MRN: [id number] Date of Birth: 06-02-1925  Transition of Care [facility]) CM/SW Contact  Alfonse JONELLE Rex, RN Phone Number: 08/17/2024, 12:10 PM  Clinical Narrative:   Met with patient and grandson, Norval, at bedside, reviewed SNF bed offers, patient request NCM call her daughter, Penni Via. Patient's grandson called [facility], SNF bed offers reviewed ( [facility], [facility], [facility]), dtr accepted bed offer at [facility], admit coordinator notified.   -4:20pm SNF auth initiated via [employer]. Auth ID [id number], shara pending.    Expected Discharge Plan: Skilled Nursing Facility Barriers to Discharge: Continued Medical Work up               Expected Discharge Plan and Services In-house Referral: Clinical Social Work   Post Acute Care Choice: Home Health Living arrangements for the past 2 months: Single Family Home                 DME Arranged: N/A DME Agency: NA                   Social Drivers of Health (SDOH) Interventions SDOH Screenings   Food Insecurity: No Food Insecurity (08/13/2024)  Housing: Low Risk (08/13/2024)  Transportation Needs: No Transportation Needs (08/13/2024)  Utilities: Not At Risk (08/13/2024)  Social Connections: Unknown (08/13/2024)  Tobacco Use: Low Risk (08/14/2024)    Readmission Risk Interventions    08/16/2024   11:45 AM  Readmission Risk Prevention Plan  Post Dischage Appt Complete  Medication Screening Complete  Transportation Screening Complete

## 2024-08-17 NOTE — Progress Notes (Signed)
 Physical Therapy Treatment Patient Details Name: Cassandra Wu MRN: [id number] DOB: 01/06/1925 Today's Date: 08/17/2024   History of Present Illness 89 y.o. female with medical history significant for HTN, recurrent UTI, breast cancer s/p left mastectomy who presented to the ED for evaluation of generalized weakness.    PT Comments  Pt assisted with sitting EOB and worked on sitting posture and LE exercises.  Pt limited by significant weakness and requiring +2 assist for bed mobility.  Continue to recommend that pt would benefit from continued inpatient follow up therapy, <3 hours/day.     If plan is discharge home, recommend the following: A lot of help with bathing/dressing/bathroom;A lot of help with walking and/or transfers;Help with stairs or ramp for entrance;Assist for transportation;Assistance with cooking/housework   Can travel by private vehicle     No  Equipment Recommendations  None recommended by PT    Recommendations for Other Services       Precautions / Restrictions Precautions Precautions: Fall     Mobility  Bed Mobility Overal bed mobility: Needs Assistance Bed Mobility: Supine to Sit, Sit to Supine     Supine to sit: Mod assist, Used rails, HOB elevated Sit to supine: Max assist, +2 for physical assistance   General bed mobility comments: assist for lower body and scooting to EOB, more assist upon return to bed and repositioning    Transfers                   General transfer comment: typically uses lift chair at home    Ambulation/Gait                   Stairs             Wheelchair Mobility     Tilt Bed    Modified Rankin (Stroke Patients Only)       Balance Overall balance assessment: Needs assistance Sitting-balance support: No upper extremity supported, Feet supported Sitting balance-Leahy Scale: Fair Sitting balance - Comments: performed hands on knees and forward trunk lean for weight shifting x5,  able to sit statically without external supportsupport                                    Communication Communication Communication: Impaired Factors Affecting Communication: Hearing impaired  Cognition Arousal: Alert Behavior During Therapy: WFL for tasks assessed/performed, Flat affect   PT - Cognitive impairments: No apparent impairments                         Following commands: Intact      Cueing Cueing Techniques: Verbal cues, Gestural cues, Tactile cues  Exercises General Exercises - Lower Extremity Ankle Circles/Pumps: AROM, Both, 10 reps, Supine, Seated Short Arc Quad: AROM, Both, Seated, 10 reps Heel Slides: AROM, Both, Seated, 5 reps, Limitations Heel Slides Limitations: limited knee flexion observed Hip Flexion/Marching: AROM, Limitations, Both, 5 reps Hip Flexion/Marching Limitations: limited AROM observed    General Comments        Pertinent Vitals/Pain Pain Assessment Pain Assessment: Faces Faces Pain Scale: Hurts little more Pain Location: BLE pain Pain Descriptors / Indicators: Sore, Tender Pain Intervention(s): Monitored during session, Repositioned    Home Living                          Prior Function  PT Goals (current goals can now be found in the care plan section) Progress towards PT goals: Progressing toward goals    Frequency    Min 2X/week      PT Plan      Co-evaluation              AM-PAC PT 6 Clicks Mobility   Outcome Measure  Help needed turning from your back to your side while in a flat bed without using bedrails?: A Lot Help needed moving from lying on your back to sitting on the side of a flat bed without using bedrails?: A Lot Help needed moving to and from a bed to a chair (including a wheelchair)?: A Lot Help needed standing up from a chair using your arms (e.g., wheelchair or bedside chair)?: A Lot Help needed to walk in hospital room?: Total Help needed  climbing 3-5 steps with a railing? : Total 6 Click Score: 10    End of Session   Activity Tolerance: Patient tolerated treatment well Patient left: in bed;with call bell/phone within reach;with bed alarm set Nurse Communication: Mobility status PT Visit Diagnosis: Other abnormalities of gait and mobility (R26.89)     Time: 1410-1433 PT Time Calculation (min) (ACUTE ONLY): 23 min  Charges:    $Therapeutic Exercise: 8-22 mins $Therapeutic Activity: 8-22 mins PT General Charges $$ ACUTE PT VISIT: 1 Visit                     Tari KLEIN, DPT Physical Therapist Acute Rehabilitation Services Office: [phone]    Tari CROME P[place] 08/17/2024, 3:15 PM

## 2024-08-17 NOTE — Progress Notes (Signed)
 [facility] Progress Note Patient: Cassandra Wu FMW:[id number] DOB: 1925/01/15  DOA: 08/13/2024 DOS: the patient was seen and examined on 08/17/2024  Brief Hospital Course: 89yo with h/o HTN, recurrent UTI, and breast cancer s/p left mastectomy who presented on 1/3 with generalized weakness in setting of recurrent UTIs.  No current UTI but she has residual weakness and appears to need STR.   Assessment and plan. Generalized weakness. Patient is a poor historian therefore unable to get the specifics but patient reports that at her baseline she has been fairly independent but for last 1 year she is progressively getting weak and tired. To the point that right now she is requiring 2+ assist to get out of the bed. No focal deficit on my examination. No evidence of active infection. Basic metabolic workup TSH 2.4, B12 1169, folic acid more than 20.  CRP and free T4 pending. Presentation could be related to her recent use of Bactrim as well.  AKI. Baseline creatinine 0.6. Current creatinine on admission 1.25. Treated with IV hydration Lasix  held Zaroxolyn held. Creatinine back to 0.83.  Chronic anemia. Hemoglobin at baseline appears to be around 10. Hemoglobin currently 9.5. Stable.  No active bleeding.  Monitor.  bilateral lower extremity edema. Right more than left. She reports that this has been ongoing recently.  No calf tenderness. Will check lower extremity Doppler. Also check echocardiogram due to patient complaint of shortness of breath as well as lower extremity edema.  Anxiety. Appears to be on Xanax . Monitor.  HTN. On Toprol -XL.  Continue. Started on Norvasc  due to uncontrolled hypertension. Will add low-dose HCTZ and monitor..  Known 2.3 cm meningioma and cerebral atrophy.  Based on MRI 2020. Monitor.  Pork allergy. On Eliquis  for DVT prophylaxis.  Subjective: Continues to complain of fatigue and tiredness.  No nausea no vomiting.  No chest pain.  No  fever no chills.  Oral intake is adequate.  Physical Exam: Clear to auscultation. S1-S2 present abdomen bowel sound present Lower extremity edema is present. No abdominal pain.  Data Reviewed: I have Reviewed nursing notes, Vitals, and Lab results. Since last encounter, pertinent lab results CBC and BMP   . I have ordered test including CBC BMP  .  Disposition: Status is: Inpatient Remains inpatient appropriate because: Monitor for improvement in deconditioning.  Awaiting placement.  apixaban  (ELIQUIS ) tablet 2.5 mg Start: 08/14/24 1000 apixaban  (ELIQUIS ) tablet 2.5 mg   Family Communication: Family at bedside. Level of care: Med-Surg   Vitals:   08/16/24 2101 08/17/24 0628 08/17/24 1215 08/17/24 1329  BP: (!) 140/75 (!) 177/80 (!) 186/103 (!) 182/88  Pulse: 78 71 78 71  Resp: 17 16  18   Temp: 97.8 F (36.6 C) 97.7 F (36.5 C)  97.9 F (36.6 C)  TempSrc: Oral Oral    SpO2: 99% 100%  100%  Weight:      Height:         Author: Yetta Blanch, MD 08/17/2024 6:17 PM  Please look on [email or web address] to find out who is on call.

## 2024-08-18 ENCOUNTER — Inpatient Hospital Stay (HOSPITAL_COMMUNITY)

## 2024-08-18 DIAGNOSIS — R0609 Other forms of dyspnea: Secondary | ICD-10-CM

## 2024-08-18 DIAGNOSIS — R531 Weakness: Secondary | ICD-10-CM | POA: Diagnosis not present

## 2024-08-18 DIAGNOSIS — R609 Edema, unspecified: Secondary | ICD-10-CM

## 2024-08-18 LAB — BASIC METABOLIC PANEL WITH GFR
Anion gap: 7 (ref 5–15)
BUN: 24 mg/dL — ABNORMAL HIGH (ref 8–23)
CO2: 24 mmol/L (ref 22–32)
Calcium: 9.2 mg/dL (ref 8.9–10.3)
Chloride: 102 mmol/L (ref 98–111)
Creatinine, Ser: 0.85 mg/dL (ref 0.44–1.00)
GFR, Estimated: >60 mL/min
Glucose, Bld: 99 mg/dL (ref 70–99)
Potassium: 4.3 mmol/L (ref 3.5–5.1)
Sodium: 133 mmol/L — ABNORMAL LOW (ref 135–145)

## 2024-08-18 LAB — CBC
HCT: 29 % — ABNORMAL LOW (ref 36.0–46.0)
Hemoglobin: 9.6 g/dL — ABNORMAL LOW (ref 12.0–15.0)
MCH: 28.7 pg (ref 26.0–34.0)
MCHC: 33.1 g/dL (ref 30.0–36.0)
MCV: 86.6 fL (ref 80.0–100.0)
Platelets: 167 K/uL (ref 150–400)
RBC: 3.35 MIL/uL — ABNORMAL LOW (ref 3.87–5.11)
RDW: 15 % (ref 11.5–15.5)
WBC: 3.6 K/uL — ABNORMAL LOW (ref 4.0–10.5)
nRBC: 0 % (ref 0.0–0.2)

## 2024-08-18 LAB — ECHOCARDIOGRAM COMPLETE
AR max vel: 2.04 cm2
AV Area VTI: 2.03 cm2
AV Area mean vel: 2.12 cm2
AV Mean grad: 8 mmHg
AV Peak grad: 14.3 mmHg
Ao pk vel: 1.89 m/s
Area-P 1/2: 3.28 cm2
Calc EF: 64 %
Height: 60 in
MV VTI: 3.36 cm2
S' Lateral: 2.6 cm
Single Plane A2C EF: 69.2 %
Single Plane A4C EF: 57.1 %
Weight: 2432 [oz_av]

## 2024-08-18 LAB — MAGNESIUM: Magnesium: 2.1 mg/dL (ref 1.7–2.4)

## 2024-08-18 MED ORDER — HYDROXYZINE HCL 10 MG PO TABS
10.0000 mg | ORAL_TABLET | Freq: Once | ORAL | Status: AC
  Administered 2024-08-18: 10 mg via ORAL
  Filled 2024-08-18: qty 1

## 2024-08-18 MED ORDER — ORAL CARE MOUTH RINSE: 15.0000 mL | OROMUCOSAL | Status: DC | PRN

## 2024-08-18 NOTE — TOC Progression Note (Signed)
 Transition of Care [facility]) - Progression Note    Patient Details  Name: Cassandra Wu MRN: [id number] Date of Birth: 1924/12/26  Transition of Care [facility]) CM/SW Contact  Alfonse JONELLE Rex, RN Phone Number: 08/18/2024, 8:49 AM  Clinical Narrative:   Per [facility] SNF auth approved- Plan Auth ID [id number], Auth ID [id number], days 08/18/2024-08/22/2024, team notified.     Expected Discharge Plan: Skilled Nursing Facility Barriers to Discharge: Continued Medical Work up               Expected Discharge Plan and Services In-house Referral: Clinical Social Work   Post Acute Care Choice: Home Health Living arrangements for the past 2 months: Single Family Home                 DME Arranged: N/A DME Agency: NA                   Social Drivers of Health (SDOH) Interventions SDOH Screenings   Food Insecurity: No Food Insecurity (08/13/2024)  Housing: Low Risk (08/13/2024)  Transportation Needs: No Transportation Needs (08/13/2024)  Utilities: Not At Risk (08/13/2024)  Social Connections: Unknown (08/13/2024)  Tobacco Use: Low Risk (08/14/2024)    Readmission Risk Interventions    08/16/2024   11:45 AM  Readmission Risk Prevention Plan  Post Dischage Appt Complete  Medication Screening Complete  Transportation Screening Complete

## 2024-08-18 NOTE — Progress Notes (Signed)
 Occupational Therapy Treatment Patient Details Name: Cassandra Wu MRN: [id number] DOB: [birth year] Today's Date: 08/18/2024   History of present illness 89 yr old female with medical history significant for HTN, recurrent UTI, breast cancer s/p left mastectomy who presented to the ED for evaluation of generalized weakness.   OT comments  OT provided introductory instruction on and recommendations for self-feeding tasks, as the pt reported having intermittent difficulty with self-feeding tasks. She reported having occasional tingling, numbness and pain of her hands, making it difficult for her to self-feed. She reported issues with spillage of food, as well as difficulty maintaining sustained grasp of standard feeding utensils. As such, OT educated her on and instructed her on using built-up utensils, to facilitate improved grasp of utensils. A foam piece was placed on the pt's spoon and she was able to scoop applesauce and bring it to her mouth on three instances. She reported use of the built-up utensil was beneficial. OT also recommended elongated utensils and the use of suction plates and bowls to decreased spillage associated with feeding. OT wrote recommendations on the aforementioned down a piece of paper for the pt to give to her family. Further education and reinforcement on feeding is recommended. Continue OT plan of care. Patient will benefit from continued inpatient follow up therapy, <3 hours/day.       If plan is discharge home, recommend the following:  A lot of help with walking and/or transfers;A lot of help with bathing/dressing/bathroom;Assistance with cooking/housework;Assistance with feeding;Direct supervision/assist for medications management;Direct supervision/assist for financial management;Assist for transportation;Help with stairs or ramp for entrance   Equipment Recommendations  Other (comment) (suction plates and bowls, elongated built-up feeding utensils)     Recommendations for Other Services      Precautions / Restrictions Precautions Precautions: Fall Restrictions Weight Bearing Restrictions Per Provider Order: No              ADL either performed or assessed with clinical judgement   ADL Overall ADL's : Needs assistance/impaired Eating/Feeding: Minimal assistance;Bed level Eating/Feeding Details (indicate cue type and reason): The pt reported having intermittent difficulty with self-feeding tasks. She reported having occasional tingling, numbness and pain of her hands, making it difficult for her to self-feed. She reported issues with spillage of food, as well as difficulty maintaining sustained grasp of standard feeding utensils. As such, OT educated her on and instructed her on using built-up utensils, to facilitate improved grasp of utensils. A foam piece was placed on the pt's spoon and she was able to scoop applesauce and bring it to her mouth on three instances. She reported built-up utensil was beneficial. OT also recommended elongated utensils and the use of suction plates and bowls to decreased spillage associated with feeding. OT wrote recommendations on the aforementioned down a piece of paper for the pt to give to her family. Further education and reinforcement on feeding is recommended.            Communication Communication Factors Affecting Communication: Hearing impaired   Cognition Arousal: Alert Behavior During Therapy: WFL for tasks assessed/performed, Flat affect               OT - Cognition Comments: needs slightly increase in time to process information                 Following commands: Intact (Follows 1 step commands consistently)        Cueing   Cueing Techniques: Verbal cues, Gestural cues  Pertinent Vitals/ Pain       Pain Assessment Pain Assessment: No/denies pain   Frequency  Min 2X/week        Progress Toward Goals  OT Goals(current goals can now be found  in the care plan section)     Acute Rehab OT Goals Patient Stated Goal: to go home OT Goal Formulation: With patient Time For Goal Achievement: 08/29/24 Potential to Achieve Goals: Good  Plan         AM-PAC OT 6 Clicks Daily Activity     Outcome Measure   Help from another person eating meals?: A Little Help from another person taking care of personal grooming?: A Little Help from another person toileting, which includes using toliet, bedpan, or urinal?: Total Help from another person bathing (including washing, rinsing, drying)?: A Lot Help from another person to put on and taking off regular upper body clothing?: A Little Help from another person to put on and taking off regular lower body clothing?: A Lot 6 Click Score: 14    End of Session Equipment Utilized During Treatment: Other (comment) (built-up utensils)  OT Visit Diagnosis: Other abnormalities of gait and mobility (R26.89);Muscle weakness (generalized) (M62.81);Feeding difficulties (R63.3)   Activity Tolerance Patient tolerated treatment well   Patient Left in bed;with call bell/phone within reach;with bed alarm set   Nurse Communication Mobility status        Time: 1710-1726 OT Time Calculation (min): 16 min  Charges: OT General Charges $OT Visit: 1 Visit OT Treatments $Self Care/Home Management : 8-22 mins    Delanna JINNY Lesches, OTR/L 08/18/2024, 6:09 PM

## 2024-08-18 NOTE — Progress Notes (Addendum)
 [facility] Progress Note Patient: Cassandra Wu Philemon FMW:[id number] DOB: Dec 10, 1924  DOA: 08/13/2024 DOS: the patient was seen and examined on 08/18/2024  Brief Hospital Course: 89yo with h/o HTN, recurrent UTI, and breast cancer s/p left mastectomy who presented on 1/3 with generalized weakness in setting of recurrent UTIs.  No current UTI but she has residual weakness and appears to need STR. Assessment and plan. Generalized weakness. Patient is a poor historian therefore unable to get the specifics but patient reports that at her baseline she has been fairly independent but for last 1 year she is progressively getting weak and tired. To the point that right now she is requiring 2+ assist to get out of the bed. No focal deficit on my examination. No evidence of active infection. Basic metabolic workup TSH 2.4, B12 1169, folic acid more than 20.  CRP less than 0.5 And free T4 1.26.  All WNL. Presentation could be related to her recent use of Bactrim as well.   AKI. Baseline creatinine 0.6. Creatinine on admission 1.25. Treated with IV hydration Lasix  held Zaroxolyn held. Creatinine back to 0.83.   Chronic anemia. Hemoglobin at baseline appears to be around 10. Hemoglobin currently 9.5. Stable.  No active bleeding.  Monitor.   Bilateral lower extremity edema. Right more than left. She reports that this has been ongoing recently.  No calf tenderness. Lower EXTR Doppler is negative for DVT. Echocardiogram shows EF 55%.  No significant wall motion abnormality or valvular abnormality.   Anxiety. Appears to be on Xanax . Monitor.   HTN. On Toprol -XL.  Continue. Started on Norvasc  due to uncontrolled hypertension. Will add low-dose HCTZ and monitor..   Meningioma. Known 2.3 cm meningioma and cerebral atrophy.  Based on MRI 2020. Monitor.   Pork allergy. On Eliquis  for DVT prophylaxis.   Subjective: No nausea no vomiting no fever no chills.  No chest pain.  Feeling  better today.  Physical Exam: Clear to auscultation. Aortic systolic murmur heard Bowel sound present Acute lower extremity edema  Data Reviewed: I have Reviewed nursing notes, Vitals, and Lab results. Since last encounter, pertinent lab results CBC BMP echocardiogram and lower extremity Doppler   .   Disposition: Status is: Inpatient Remains inpatient appropriate because: Awaiting placement  apixaban  (ELIQUIS ) tablet 2.5 mg Start: 08/14/24 1000 apixaban  (ELIQUIS ) tablet 2.5 mg   Family Communication: No one at bedside. Discussed with daughter on the phone. Level of care: Med-Surg   Vitals:   08/18/24 0018 08/18/24 0608 08/18/24 0621 08/18/24 1348  BP: (!) 149/69 (!) 171/86 (!) 171/86 139/75  Pulse: 73 72  76  Resp: 18 18  17   Temp: 97.8 F (36.6 C) 97.9 F (36.6 C)    TempSrc: Oral Oral    SpO2: 98% 99%  98%  Weight:      Height:         Author: Yetta Blanch, MD 08/18/2024 4:53 PM  Please look on [email or web address] to find out who is on call.

## 2024-08-18 NOTE — Progress Notes (Signed)
 Bilateral lower extremity venous duplex has been completed. Preliminary results can be found in CV Proc through chart review.   08/18/2024 9:54 AM Cathlyn Collet RVT

## 2024-08-19 DIAGNOSIS — R531 Weakness: Secondary | ICD-10-CM | POA: Diagnosis not present

## 2024-08-19 MED ORDER — HYDROXYZINE HCL 10 MG PO TABS
10.0000 mg | ORAL_TABLET | Freq: Three times a day (TID) | ORAL | Status: DC | PRN
  Administered 2024-08-19: 10 mg via ORAL
  Filled 2024-08-19 (×2): qty 1

## 2024-08-19 MED ORDER — ALPRAZOLAM 0.5 MG PO TABS: 0.5000 mg | ORAL_TABLET | Freq: Every day | ORAL | 0 refills | Status: AC | PRN

## 2024-08-19 MED ORDER — AMLODIPINE BESYLATE 5 MG PO TABS: 5.0000 mg | ORAL_TABLET | Freq: Every day | ORAL | 0 refills | Status: AC

## 2024-08-19 NOTE — Care Management Important Message (Signed)
 Important Message  Patient Details IM Letter given. Name: Cassandra Wu MRN: [id number] Date of Birth: February 27, 1925   Important Message Given:  Yes - Medicare IM     [name] 08/19/2024, 11:19 AM

## 2024-08-19 NOTE — TOC Transition Note (Signed)
 Transition of Care [facility]) - Discharge Note   Patient Details  Name: Cassandra Wu MRN: [id number] Date of Birth: 1924-12-28  Transition of Care [facility]) CM/SW Contact:  Cassandra JONELLE Rex, RN Phone Number: 08/19/2024, 3:52 PM   Clinical Narrative:   Dc order to SNF [facility]), Pt's dtr, Cassandra Wu, notified of discharge. PTAR for transport. No further CM needs identified at this time.      Final next level of care: Skilled Nursing Facility Barriers to Discharge: Barriers Resolved   Patient Goals and CMS Choice Patient states their goals for this hospitalization and ongoing recovery are:: Return home CMS Medicare.gov Compare Post Acute Care list provided to:: Patient Represenative (must comment) Cassandra Wu  Daughter, Emergency Contact  [phone] (Mobile)) Choice offered to / list presented to : Adult Children [place] ownership interest in [facility].provided to:: Adult Children    Discharge Placement              Patient chooses bed at: [facility] Patient to be transferred to facility by: PTAR Name of family member notified: Cassandra Wu  Daughter, Emergency Contact  [phone] (Mobile) Patient and family notified of of transfer: 08/19/24  Discharge Plan and Services Additional resources added to the After Visit Summary for   In-house Referral: Clinical Social Work   Post Acute Care Choice: Home Health          DME Arranged: N/A DME Agency: NA                  Social Drivers of Health (SDOH) Interventions SDOH Screenings   Food Insecurity: No Food Insecurity (08/13/2024)  Housing: Low Risk (08/13/2024)  Transportation Needs: No Transportation Needs (08/13/2024)  Utilities: Not At Risk (08/13/2024)  Social Connections: Unknown (08/13/2024)  Tobacco Use: Low Risk (08/14/2024)     Readmission Risk Interventions    08/16/2024   11:45 AM  Readmission Risk Prevention Plan  Post Dischage Appt Complete  Medication Screening Complete   Transportation Screening Complete

## 2024-08-19 NOTE — Discharge Summary (Signed)
 " Physician Discharge Summary   Patient: Cassandra Wu MRN: [id number] DOB: 12/25/1924  Admit date:     08/13/2024  Discharge date: 08/19/2024  Discharge Physician: Yetta Blanch  PCP: Harlie Clint SQUIBB, FNP  Disposition: SNF Recommendations at discharge:  Follow up with PCP in 1 week   Contact information for follow-up providers     McAlexander, Clint SQUIBB, FNP. Schedule an appointment as soon as possible for a visit in 1 week(s).   Specialty: Internal Medicine Why: To discuss blood pressure meds Contact information: [address] [place] [address] [place] [address] [phone]              Contact information for after-discharge care     Destination     [facility] .   Service: Skilled Nursing Contact information: [address] [place]  [address] [phone]                    Hospital Course: 89yo with h/o HTN, recurrent UTI, and breast cancer s/p left mastectomy who presented on 1/3 with generalized weakness in setting of recurrent UTIs.  No current UTI but she has residual weakness and appears to need STR. Assessment and plan. Generalized weakness. Patient is a poor historian therefore unable to get the specifics but patient reports that at her baseline she has been fairly independent but for last 1 year she is progressively getting weak and tired. To the point that right now she is requiring 2+ assist to get out of the bed. No focal deficit on my examination. No evidence of active infection. Basic metabolic workup TSH 2.4, B12 1169, folic acid more than 20.  CRP less than 0.5 And free T4 1.26.  All WNL. Presentation could be related to her recent use of Bactrim as well.   AKI. Baseline creatinine 0.6. Creatinine on admission 1.25. Treated with IV hydration Lasix  held Zaroxolyn held. Creatinine back to 0.83.   Chronic anemia. Hemoglobin at baseline appears to be around 10. Hemoglobin  currently 9.5. Stable.  No active bleeding.  Monitor.   Bilateral lower extremity edema. Right more than left. She reports that this has been ongoing recently.  No calf tenderness. Lower EXTR Doppler is negative for DVT. Echocardiogram shows EF 55%.  No significant wall motion abnormality or valvular abnormality.   Anxiety. Appears to be on Xanax . Monitor.   HTN. On Toprol -XL.  Continue. Started on Norvasc  due to uncontrolled hypertension. Will add low-dose HCTZ and monitor..   Meningioma. Known 2.3 cm meningioma and cerebral atrophy.  Based on MRI 2020. Monitor.   Pork allergy. On Eliquis  for DVT prophylaxis.  Pain control - [place]  Controlled Substance Reporting System database was reviewed. and patient was instructed, not to drive, operate heavy machinery, perform activities at heights, swimming or participation in water activities or provide baby-sitting services while on Pain, Sleep and Anxiety Medications; until their outpatient Physician has advised to do so again. Also recommended to not to take more than prescribed Pain, Sleep and Anxiety Medications.   Family Communication: Plan was discussed with Family.   Consultants:  none  Procedures performed:  Echocardiogram   DISCHARGE MEDICATION: Allergies as of 08/19/2024       Reactions   Penicillins    Porcine (pork) Protein-containing Drug Products    Propranolol Hcl         Medication List     STOP taking these medications    metolazone 2.5 MG tablet Commonly known as:  ZAROXOLYN   nitrofurantoin (macrocrystal-monohydrate) 100 MG capsule Commonly known as: MACROBID   sulfamethoxazole-trimethoprim 800-160 MG tablet Commonly known as: BACTRIM DS       TAKE these medications    acetaminophen  325 MG tablet Commonly known as: TYLENOL  Take 2 tablets (650 mg total) by mouth every 6 (six) hours as needed for mild pain (or Fever >/= 101).   ALPRAZolam  0.5 MG tablet Commonly known as: XANAX  Take 1  tablet (0.5 mg total) by mouth daily as needed for anxiety.   amLODipine  5 MG tablet Commonly known as: NORVASC  Take 1 tablet (5 mg total) by mouth daily. Start taking on: August 20, 2024   aspirin  EC 81 MG tablet Take 81 mg by mouth daily. Swallow whole.   benzonatate  200 MG capsule Commonly known as: TESSALON  Take 1 capsule (200 mg total) by mouth 3 (three) times daily as needed for cough.   Cholecalciferol 25 MCG (1000 UT) capsule Take 1,000 Units by mouth daily.   docusate sodium  100 MG capsule Commonly known as: COLACE Take 100 mg by mouth 2 (two) times daily.   ferrous sulfate  325 (65 FE) MG tablet Take 1 tablet (325 mg total) by mouth 2 (two) times daily with a meal. What changed:  when to take this additional instructions   fluticasone  50 MCG/ACT nasal spray Commonly known as: FLONASE  Place 1 spray into both nostrils daily.   furosemide  40 MG tablet Commonly known as: LASIX  Take 40 mg by mouth daily.   hydrOXYzine  10 MG tablet Commonly known as: ATARAX  Take 10 mg by mouth every 6 (six) hours as needed for anxiety or itching.   ICAPS AREDS 2 PO Take 1 capsule by mouth daily.   metoprolol  succinate 50 MG 24 hr tablet Commonly known as: TOPROL -XL Take 50 mg by mouth daily. Take with or immediately following a meal.   MULTIVITAMIN PO Take 1 tablet by mouth daily.   nystatin cream Commonly known as: MYCOSTATIN Apply 1 Application topically 2 (two) times daily as needed for dry skin.   nystatin powder Commonly known as: MYCOSTATIN/NYSTOP Apply 1 Application topically 3 (three) times daily as needed (Irritation).   polyethylene glycol powder 17 GM/SCOOP powder Commonly known as: MiraLax  Take 17 g by mouth 2 (two) times daily as needed for moderate constipation.   potassium chloride  10 MEQ tablet Commonly known as: KLOR-CON  Take 10 mEq by mouth daily.   senna-docusate 8.6-50 MG tablet Commonly known as: Senokot-S Take 1 tablet by mouth 2 (two) times  daily between meals as needed for mild constipation.   Voltaren 1 % Gel Generic drug: diclofenac Sodium Apply 4 g topically 4 (four) times daily as needed (Pain).        Diet recommendation: Regular diet  Discharge Exam: Vitals:   08/18/24 0621 08/18/24 1348 08/18/24 2042 08/19/24 0650  BP: (!) 171/86 139/75 139/76 (!) 154/85  Pulse:  76 79 73  Resp:  17 17 14   Temp:   98.2 F (36.8 C) 97.8 F (36.6 C)  TempSrc:   Oral Oral  SpO2:  98% 100% 100%  Weight:      Height:        Filed Weights   08/13/24 1317  Weight: 68.9 kg   General: in Mild distress, No Rash Cardiovascular: S1 and S2 Present, No Murmur Respiratory: Good respiratory effort, Bilateral Air entry present. No Crackles, No wheezes Abdomen: Bowel Sound present, No tenderness Extremities: No edema Neuro: Alert and oriented x3, no new focal deficit  Condition at discharge: stable  The results of significant diagnostics from this hospitalization (including imaging, microbiology, ancillary and laboratory) are listed below for reference.   Imaging Studies: VAS US  LOWER EXTREMITY VENOUS (DVT) Result Date: 08/18/2024  Lower Venous DVT Study Patient Name:  Cassandra Wu  Date of Exam:   08/18/2024 Medical Rec #: [id number]              Accession #:    [id number] Date of Birth: 03/10/1925             Patient Gender: F Patient Age:   18 years Exam Location:  [facility] Procedure:      VAS US  LOWER EXTREMITY VENOUS (DVT) Referring Phys: Patrice Matthew --------------------------------------------------------------------------------  Indications: Edema.  Risk Factors: None identified. Limitations: Poor ultrasound/tissue interface and patient positioning, patient pain tolerance. Comparison Study: No prior studies. Performing Technologist: Cordella Collet RVT  Examination Guidelines: A complete evaluation includes B-mode imaging, spectral Doppler, color Doppler, and power Doppler as needed of all accessible portions  of each vessel. Bilateral testing is considered an integral part of a complete examination. Limited examinations for reoccurring indications may be performed as noted. The reflux portion of the exam is performed with the patient in reverse Trendelenburg.  +---------+---------------+---------+-----------+----------+-------------------+ RIGHT    CompressibilityPhasicitySpontaneityPropertiesThrombus Aging      +---------+---------------+---------+-----------+----------+-------------------+ CFV      Full           Yes      Yes                                      +---------+---------------+---------+-----------+----------+-------------------+ SFJ      Full                                                             +---------+---------------+---------+-----------+----------+-------------------+ FV Prox  Full                                                             +---------+---------------+---------+-----------+----------+-------------------+ FV Mid   Full                                                             +---------+---------------+---------+-----------+----------+-------------------+ FV Distal               Yes      Yes                                      +---------+---------------+---------+-----------+----------+-------------------+ PFV      Full                                                             +---------+---------------+---------+-----------+----------+-------------------+  POP      Full           Yes      Yes                                      +---------+---------------+---------+-----------+----------+-------------------+ PTV                                                   Not well visualized +---------+---------------+---------+-----------+----------+-------------------+ PERO                                                  Not well visualized  +---------+---------------+---------+-----------+----------+-------------------+   +---------+---------------+---------+-----------+----------+-------------------+ LEFT     CompressibilityPhasicitySpontaneityPropertiesThrombus Aging      +---------+---------------+---------+-----------+----------+-------------------+ CFV      Full           Yes      Yes                                      +---------+---------------+---------+-----------+----------+-------------------+ SFJ      Full                                                             +---------+---------------+---------+-----------+----------+-------------------+ FV Prox  Full                                                             +---------+---------------+---------+-----------+----------+-------------------+ FV Mid                  Yes      Yes                                      +---------+---------------+---------+-----------+----------+-------------------+ FV Distal               Yes      Yes                                      +---------+---------------+---------+-----------+----------+-------------------+ PFV      Full                                                             +---------+---------------+---------+-----------+----------+-------------------+ POP      Full           Yes      Yes                                      +---------+---------------+---------+-----------+----------+-------------------+  PTV      Full                                                             +---------+---------------+---------+-----------+----------+-------------------+ PERO                                                  Not well visualized +---------+---------------+---------+-----------+----------+-------------------+     Summary: RIGHT: - There is no evidence of deep vein thrombosis in the lower extremity. However, portions of this examination were limited- see technologist  comments above.  - No cystic structure found in the popliteal fossa.  LEFT: - There is no evidence of deep vein thrombosis in the lower extremity. However, portions of this examination were limited- see technologist comments above.  - No cystic structure found in the popliteal fossa.  *See table(s) above for measurements and observations. Electronically signed by Debby Robertson on 08/18/2024 at 8:19:42 PM.    Final    ECHOCARDIOGRAM COMPLETE Result Date: 08/18/2024    ECHOCARDIOGRAM REPORT   Patient Name:   Cassandra Wu Date of Exam: 08/18/2024 Medical Rec #:  [id number]             Height:       60.0 in Accession #:    [id number]            Weight:       152.0 lb Date of Birth:  18-Jul-1925            BSA:          1.661 m Patient Age:    89 years              BP:           171/86 mmHg Patient Gender: F                     HR:           88 bpm. Exam Location:  Inpatient Procedure: 2D Echo, Cardiac Doppler and Color Doppler (Both Spectral and Color            Flow Doppler were utilized during procedure). Indications:    Dyspnea R06.00  History:        Patient has prior history of Echocardiogram examinations, most                 recent 08/12/2021. Risk Factors:Hypertension.  Sonographer:    Nathanel Devonshire Referring Phys: [id number] Oseias Horsey M Sona Nations IMPRESSIONS  1. Left ventricular ejection fraction, by estimation, is 60 to 65%. The left ventricle has normal function. The left ventricle has no regional wall motion abnormalities. There is severe asymmetric left ventricular hypertrophy of the basal-septal segment. Left ventricular diastolic parameters are consistent with Grade II diastolic dysfunction (pseudonormalization).  2. Right ventricular systolic function is normal. The right ventricular size is normal.  3. The mitral valve is grossly normal. No evidence of mitral valve regurgitation. No evidence of mitral stenosis.  4. The aortic valve was not well visualized. There is mild calcification of the aortic valve. Aortic  valve regurgitation is trivial. Aortic valve sclerosis/calcification is present, without any evidence  of aortic stenosis.  5. The inferior vena cava is normal in size with greater than 50% respiratory variability, suggesting right atrial pressure of 3 mmHg. Comparison(s): No significant change from prior study. Conclusion(s)/Recommendation(s): Normal right atrial pressure, despite grade 2 diastolic dysfunction. Severe asymmetric basal septal hypertrophy, otherwise moderate LVH, without significant LVOT gradient. FINDINGS  Left Ventricle: Left ventricular ejection fraction, by estimation, is 60 to 65%. The left ventricle has normal function. The left ventricle has no regional wall motion abnormalities. The left ventricular internal cavity size was normal in size. There is  severe asymmetric left ventricular hypertrophy of the basal-septal segment. Left ventricular diastolic parameters are consistent with Grade II diastolic dysfunction (pseudonormalization). Right Ventricle: The right ventricular size is normal. Right vetricular wall thickness was not well visualized. Right ventricular systolic function is normal. Left Atrium: Left atrial size was normal in size. Right Atrium: Right atrial size was not well visualized. Pericardium: There is no evidence of pericardial effusion. Mitral Valve: The mitral valve is grossly normal. No evidence of mitral valve regurgitation. No evidence of mitral valve stenosis. MV peak gradient, 5.3 mmHg. The mean mitral valve gradient is 2.0 mmHg. Tricuspid Valve: The tricuspid valve is not well visualized. Tricuspid valve regurgitation is trivial. No evidence of tricuspid stenosis. Aortic Valve: The aortic valve was not well visualized. There is mild calcification of the aortic valve. Aortic valve regurgitation is trivial. Aortic valve sclerosis/calcification is present, without any evidence of aortic stenosis. Aortic valve mean gradient measures 8.0 mmHg. Aortic valve peak gradient  measures 14.3 mmHg. Aortic valve area, by VTI measures 2.03 cm. Pulmonic Valve: The pulmonic valve was not well visualized. Pulmonic valve regurgitation is not visualized. No evidence of pulmonic stenosis. Aorta: The aortic root and ascending aorta are structurally normal, with no evidence of dilitation. Venous: The inferior vena cava is normal in size with greater than 50% respiratory variability, suggesting right atrial pressure of 3 mmHg. IAS/Shunts: The interatrial septum was not well visualized.  LEFT VENTRICLE PLAX 2D LVIDd:         3.60 cm     Diastology LVIDs:         2.60 cm     LV e' medial:    4.68 cm/s LV PW:         1.35 cm     LV E/e' medial:  14.9 LV IVS:        1.90 cm     LV e' lateral:   5.66 cm/s LVOT diam:     2.00 cm     LV E/e' lateral: 12.3 LV SV:         71 LV SV Index:   43 LVOT Area:     3.14 cm LV IVRT:       53 msec  LV Volumes (MOD) LV vol d, MOD A2C: 58.1 ml LV vol d, MOD A4C: 49.4 ml LV vol s, MOD A2C: 17.9 ml LV vol s, MOD A4C: 21.2 ml LV SV MOD A2C:     40.2 ml LV SV MOD A4C:     49.4 ml LV SV MOD BP:      34.8 ml RIGHT VENTRICLE          IVC RV Basal diam:  2.60 cm  IVC diam: 1.80 cm TAPSE (M-mode): 2.0 cm LEFT ATRIUM             Index LA diam:        2.80 cm 1.69 cm/m LA Vol (A2C):  25.1 ml 15.11 ml/m LA Vol (A4C):   23.9 ml 14.39 ml/m LA Biplane Vol: 25.3 ml 15.23 ml/m  AORTIC VALVE                     PULMONIC VALVE AV Area (Vmax):    2.04 cm      PV Vmax:       1.22 m/s AV Area (Vmean):   2.12 cm      PV Peak grad:  6.0 mmHg AV Area (VTI):     2.03 cm AV Vmax:           189.00 cm/s AV Vmean:          135.000 cm/s AV VTI:            0.349 m AV Peak Grad:      14.3 mmHg AV Mean Grad:      8.0 mmHg LVOT Vmax:         123.00 cm/s LVOT Vmean:        91.000 cm/s LVOT VTI:          0.226 m LVOT/AV VTI ratio: 0.65  AORTA Ao Root diam: 3.10 cm Ao Asc diam:  3.30 cm MITRAL VALVE MV Area (PHT): 3.28 cm     SHUNTS MV Area VTI:   3.36 cm     Systemic VTI:  0.23 m MV Peak grad:   5.3 mmHg     Systemic Diam: 2.00 cm MV Mean grad:  2.0 mmHg MV Vmax:       1.15 m/s MV Vmean:      68.6 cm/s MV E velocity: 69.80 cm/s MV A velocity: 106.00 cm/s MV E/A ratio:  0.66 Shelda Bruckner MD Electronically signed by Shelda Bruckner MD Signature Date/Time: 08/18/2024/2:38:57 PM    Final     Microbiology: Results for orders placed or performed during the hospital encounter of 08/13/24  Urine Culture     Status: Abnormal   Collection Time: 08/13/24  5:22 PM   Specimen: Urine, Random  Result Value Ref Range Status   Specimen Description   Final    URINE, RANDOM Performed at [facility], [address], [place], [place] [address]    Special Requests   Final    NONE Reflexed from [id number] Performed at [facility], [address], [place], [place] [address]    Culture (A)  Final    <10,000 COLONIES/mL INSIGNIFICANT GROWTH Performed at [facility], [address], [place], [place] [address]    Report Status 08/15/2024 FINAL  Final   Labs: CBC: Recent Labs  Lab 08/13/24 1529 08/14/24 0338 08/15/24 1015 08/16/24 0449 08/18/24 0256  WBC 2.8* 2.7* 2.5* 2.7* 3.6*  NEUTROABS 1.1* 0.8*  --   --   --   HGB 10.8* 9.7* 10.2* 9.5* 9.6*  HCT 33.2* 29.5* 31.8* 29.2* 29.0*  MCV 86.5 87.0 87.8 86.4 86.6  PLT 180 171 157 173 167   Basic Metabolic Panel: Recent Labs  Lab 08/13/24 1529 08/14/24 0338 08/15/24 1015 08/16/24 0449 08/18/24 0256  NA 136 138 136 136 133*  K 4.4 4.2 4.1 4.2 4.3  CL 100 103 103 105 102  CO2 26 27 25 25 24   GLUCOSE 110* 95 163* 91 99  BUN 18 18 14 17  24*  CREATININE 1.25* 1.16* 0.97 0.83 0.85  CALCIUM 9.7 9.4 9.1 9.2 9.2  MG  --   --   --   --  2.1  Liver Function Tests: Recent Labs  Lab 08/13/24 1529  AST 24  ALT 12  ALKPHOS 64  BILITOT 0.4  PROT 6.9  ALBUMIN 3.8   CBG: No results for input(s): GLUCAP in the last 168 hours.  Discharge time spent: 35 minutes  Author: Yetta Blanch, MD  [facility]    "

## 2024-08-19 NOTE — Plan of Care (Signed)
" °  Problem: Health Behavior/Discharge Planning: Goal: Ability to manage health-related needs will improve Outcome: Adequate for Discharge   Problem: Clinical Measurements: Goal: Ability to maintain clinical measurements within normal limits will improve Outcome: Adequate for Discharge Goal: Will remain free from infection Outcome: Progressing Goal: Diagnostic test results will improve Outcome: Progressing Goal: Respiratory complications will improve Outcome: Progressing Goal: Cardiovascular complication will be avoided Outcome: Progressing   Problem: Activity: Goal: Risk for activity intolerance will decrease Outcome: Progressing   Problem: Nutrition: Goal: Adequate nutrition will be maintained Outcome: Adequate for Discharge   Problem: Coping: Goal: Level of anxiety will decrease Outcome: Progressing   Problem: Elimination: Goal: Will not experience complications related to bowel motility Outcome: Completed/Met Goal: Will not experience complications related to urinary retention Outcome: Completed/Met   Problem: Pain Managment: Goal: General experience of comfort will improve and/or be controlled Outcome: Progressing   Problem: Safety: Goal: Ability to remain free from injury will improve Outcome: Progressing   Problem: Skin Integrity: Goal: Risk for impaired skin integrity will decrease Outcome: Progressing   "
